# Patient Record
Sex: Female | Born: 1935 | Race: White | Hispanic: No | Marital: Single | Smoking: Never smoker
Health system: Southern US, Community
[De-identification: ages and names within clinical notes are randomized; demographics above are authoritative.]

## PROBLEM LIST (undated history)

## (undated) DIAGNOSIS — S62101A Fracture of unspecified carpal bone, right wrist, initial encounter for closed fracture: Secondary | ICD-10-CM

## (undated) DIAGNOSIS — R51 Headache: Secondary | ICD-10-CM

## (undated) DIAGNOSIS — E785 Hyperlipidemia, unspecified: Secondary | ICD-10-CM

## (undated) DIAGNOSIS — T4145XA Adverse effect of unspecified anesthetic, initial encounter: Secondary | ICD-10-CM

## (undated) DIAGNOSIS — I10 Essential (primary) hypertension: Secondary | ICD-10-CM

## (undated) DIAGNOSIS — N289 Disorder of kidney and ureter, unspecified: Secondary | ICD-10-CM

## (undated) DIAGNOSIS — IMO0001 Reserved for inherently not codable concepts without codable children: Secondary | ICD-10-CM

## (undated) DIAGNOSIS — Z5189 Encounter for other specified aftercare: Secondary | ICD-10-CM

## (undated) DIAGNOSIS — C55 Malignant neoplasm of uterus, part unspecified: Secondary | ICD-10-CM

## (undated) DIAGNOSIS — E559 Vitamin D deficiency, unspecified: Secondary | ICD-10-CM

## (undated) DIAGNOSIS — T8859XA Other complications of anesthesia, initial encounter: Secondary | ICD-10-CM

## (undated) DIAGNOSIS — I340 Nonrheumatic mitral (valve) insufficiency: Secondary | ICD-10-CM

## (undated) DIAGNOSIS — H5461 Unqualified visual loss, right eye, normal vision left eye: Secondary | ICD-10-CM

## (undated) DIAGNOSIS — R0602 Shortness of breath: Secondary | ICD-10-CM

## (undated) DIAGNOSIS — I499 Cardiac arrhythmia, unspecified: Secondary | ICD-10-CM

## (undated) DIAGNOSIS — E039 Hypothyroidism, unspecified: Secondary | ICD-10-CM

## (undated) DIAGNOSIS — R7301 Impaired fasting glucose: Secondary | ICD-10-CM

## (undated) DIAGNOSIS — H3530 Unspecified macular degeneration: Secondary | ICD-10-CM

## (undated) DIAGNOSIS — G43909 Migraine, unspecified, not intractable, without status migrainosus: Secondary | ICD-10-CM

## (undated) DIAGNOSIS — I4891 Unspecified atrial fibrillation: Secondary | ICD-10-CM

## (undated) DIAGNOSIS — N390 Urinary tract infection, site not specified: Secondary | ICD-10-CM

## (undated) DIAGNOSIS — C801 Malignant (primary) neoplasm, unspecified: Secondary | ICD-10-CM

## (undated) DIAGNOSIS — N184 Chronic kidney disease, stage 4 (severe): Secondary | ICD-10-CM

## (undated) HISTORY — DX: Malignant neoplasm of uterus, part unspecified: C55

## (undated) HISTORY — DX: Unqualified visual loss, right eye, normal vision left eye: H54.61

## (undated) HISTORY — DX: Migraine, unspecified, not intractable, without status migrainosus: G43.909

## (undated) HISTORY — DX: Urinary tract infection, site not specified: N39.0

## (undated) HISTORY — PX: NERVE BIOPSY: SHX1015

## (undated) HISTORY — DX: Vitamin D deficiency, unspecified: E55.9

## (undated) HISTORY — DX: Fracture of unspecified carpal bone, right wrist, initial encounter for closed fracture: S62.101A

## (undated) HISTORY — DX: Impaired fasting glucose: R73.01

## (undated) HISTORY — DX: Unspecified atrial fibrillation: I48.91

## (undated) HISTORY — DX: Unspecified macular degeneration: H35.30

## (undated) HISTORY — DX: Chronic kidney disease, stage 4 (severe): N18.4

## (undated) HISTORY — PX: BLEPHAROPLASTY: SUR158

## (undated) HISTORY — DX: Nonrheumatic mitral (valve) insufficiency: I34.0

## (undated) HISTORY — PX: OTHER SURGICAL HISTORY: SHX169

## (undated) HISTORY — DX: Hyperlipidemia, unspecified: E78.5

## (undated) HISTORY — PX: TONSILLECTOMY: SUR1361

---

## 1998-12-21 ENCOUNTER — Other Ambulatory Visit: Admission: RE | Admit: 1998-12-21 | Discharge: 1998-12-21 | Payer: Self-pay | Admitting: *Deleted

## 1999-01-25 ENCOUNTER — Encounter: Admission: RE | Admit: 1999-01-25 | Discharge: 1999-01-25 | Payer: Self-pay

## 1999-01-25 ENCOUNTER — Encounter: Payer: Self-pay | Admitting: *Deleted

## 2000-04-01 ENCOUNTER — Encounter: Admission: RE | Admit: 2000-04-01 | Discharge: 2000-04-01 | Payer: Self-pay | Admitting: Internal Medicine

## 2000-04-01 ENCOUNTER — Encounter: Payer: Self-pay | Admitting: Internal Medicine

## 2001-12-17 ENCOUNTER — Encounter: Payer: Self-pay | Admitting: Internal Medicine

## 2001-12-17 ENCOUNTER — Encounter: Admission: RE | Admit: 2001-12-17 | Discharge: 2001-12-17 | Payer: Self-pay | Admitting: Internal Medicine

## 2002-01-01 ENCOUNTER — Encounter: Admission: RE | Admit: 2002-01-01 | Discharge: 2002-01-01 | Payer: Self-pay | Admitting: Internal Medicine

## 2002-01-01 ENCOUNTER — Encounter: Payer: Self-pay | Admitting: Internal Medicine

## 2002-01-26 ENCOUNTER — Encounter (INDEPENDENT_AMBULATORY_CARE_PROVIDER_SITE_OTHER): Payer: Self-pay | Admitting: Specialist

## 2002-01-26 ENCOUNTER — Inpatient Hospital Stay (HOSPITAL_COMMUNITY): Admission: RE | Admit: 2002-01-26 | Discharge: 2002-02-02 | Payer: Self-pay

## 2002-02-10 ENCOUNTER — Ambulatory Visit: Admission: RE | Admit: 2002-02-10 | Discharge: 2002-02-10 | Payer: Self-pay | Admitting: Gynecology

## 2002-02-15 ENCOUNTER — Encounter: Payer: Self-pay | Admitting: Gynecology

## 2002-02-15 ENCOUNTER — Ambulatory Visit (HOSPITAL_COMMUNITY): Admission: RE | Admit: 2002-02-15 | Discharge: 2002-02-15 | Payer: Self-pay | Admitting: Gynecology

## 2002-03-02 ENCOUNTER — Ambulatory Visit (HOSPITAL_COMMUNITY): Admission: RE | Admit: 2002-03-02 | Discharge: 2002-03-02 | Payer: Self-pay | Admitting: Oncology

## 2002-03-02 ENCOUNTER — Encounter: Payer: Self-pay | Admitting: Oncology

## 2002-03-09 ENCOUNTER — Inpatient Hospital Stay (HOSPITAL_COMMUNITY): Admission: AD | Admit: 2002-03-09 | Discharge: 2002-03-11 | Payer: Self-pay | Admitting: Oncology

## 2002-03-30 ENCOUNTER — Inpatient Hospital Stay (HOSPITAL_COMMUNITY): Admission: AD | Admit: 2002-03-30 | Discharge: 2002-04-01 | Payer: Self-pay | Admitting: Gynecology

## 2002-04-13 ENCOUNTER — Ambulatory Visit: Admission: RE | Admit: 2002-04-13 | Discharge: 2002-04-13 | Payer: Self-pay | Admitting: Gynecologic Oncology

## 2002-04-27 ENCOUNTER — Inpatient Hospital Stay (HOSPITAL_COMMUNITY): Admission: AD | Admit: 2002-04-27 | Discharge: 2002-04-29 | Payer: Self-pay | Admitting: Oncology

## 2002-05-18 ENCOUNTER — Inpatient Hospital Stay (HOSPITAL_COMMUNITY): Admission: AD | Admit: 2002-05-18 | Discharge: 2002-05-20 | Payer: Self-pay | Admitting: Oncology

## 2002-06-08 ENCOUNTER — Inpatient Hospital Stay (HOSPITAL_COMMUNITY): Admission: EM | Admit: 2002-06-08 | Discharge: 2002-06-10 | Payer: Self-pay | Admitting: Oncology

## 2002-07-06 ENCOUNTER — Inpatient Hospital Stay (HOSPITAL_COMMUNITY): Admission: EM | Admit: 2002-07-06 | Discharge: 2002-07-08 | Payer: Self-pay | Admitting: Oncology

## 2002-07-22 ENCOUNTER — Other Ambulatory Visit: Admission: RE | Admit: 2002-07-22 | Discharge: 2002-07-22 | Payer: Self-pay

## 2002-07-26 ENCOUNTER — Ambulatory Visit: Admission: RE | Admit: 2002-07-26 | Discharge: 2002-09-08 | Payer: Self-pay | Admitting: *Deleted

## 2002-08-16 ENCOUNTER — Ambulatory Visit (HOSPITAL_COMMUNITY): Admission: RE | Admit: 2002-08-16 | Discharge: 2002-08-16 | Payer: Self-pay | Admitting: Oncology

## 2002-08-16 ENCOUNTER — Encounter: Payer: Self-pay | Admitting: Oncology

## 2002-09-13 ENCOUNTER — Ambulatory Visit (HOSPITAL_COMMUNITY): Admission: RE | Admit: 2002-09-13 | Discharge: 2002-09-13 | Payer: Self-pay | Admitting: *Deleted

## 2002-09-13 ENCOUNTER — Encounter: Payer: Self-pay | Admitting: *Deleted

## 2002-09-15 ENCOUNTER — Ambulatory Visit: Admission: RE | Admit: 2002-09-15 | Discharge: 2002-09-15 | Payer: Self-pay | Admitting: Gynecology

## 2002-12-13 ENCOUNTER — Ambulatory Visit (HOSPITAL_COMMUNITY): Admission: RE | Admit: 2002-12-13 | Discharge: 2002-12-13 | Payer: Self-pay | Admitting: Gynecology

## 2002-12-13 ENCOUNTER — Encounter: Payer: Self-pay | Admitting: Gynecology

## 2002-12-30 ENCOUNTER — Encounter: Admission: RE | Admit: 2002-12-30 | Discharge: 2002-12-30 | Payer: Self-pay | Admitting: Internal Medicine

## 2003-02-09 ENCOUNTER — Ambulatory Visit: Admission: RE | Admit: 2003-02-09 | Discharge: 2003-02-09 | Payer: Self-pay | Admitting: Gynecology

## 2003-05-30 ENCOUNTER — Ambulatory Visit (HOSPITAL_COMMUNITY): Admission: RE | Admit: 2003-05-30 | Discharge: 2003-05-30 | Payer: Self-pay | Admitting: Oncology

## 2003-05-31 ENCOUNTER — Ambulatory Visit (HOSPITAL_COMMUNITY): Admission: RE | Admit: 2003-05-31 | Discharge: 2003-05-31 | Payer: Self-pay | Admitting: Oncology

## 2003-06-14 ENCOUNTER — Ambulatory Visit (HOSPITAL_COMMUNITY): Admission: RE | Admit: 2003-06-14 | Discharge: 2003-06-14 | Payer: Self-pay | Admitting: Oncology

## 2003-09-07 ENCOUNTER — Other Ambulatory Visit: Admission: RE | Admit: 2003-09-07 | Discharge: 2003-09-07 | Payer: Self-pay | Admitting: Gynecology

## 2003-09-07 ENCOUNTER — Encounter (INDEPENDENT_AMBULATORY_CARE_PROVIDER_SITE_OTHER): Payer: Self-pay | Admitting: Specialist

## 2003-09-07 ENCOUNTER — Ambulatory Visit: Admission: RE | Admit: 2003-09-07 | Discharge: 2003-09-07 | Payer: Self-pay | Admitting: Gynecology

## 2003-12-12 ENCOUNTER — Ambulatory Visit (HOSPITAL_COMMUNITY): Admission: RE | Admit: 2003-12-12 | Discharge: 2003-12-12 | Payer: Self-pay | Admitting: Oncology

## 2004-02-02 ENCOUNTER — Encounter: Admission: RE | Admit: 2004-02-02 | Discharge: 2004-02-02 | Payer: Self-pay | Admitting: Oncology

## 2004-06-01 ENCOUNTER — Ambulatory Visit: Payer: Self-pay | Admitting: Oncology

## 2004-06-12 ENCOUNTER — Ambulatory Visit (HOSPITAL_COMMUNITY): Admission: RE | Admit: 2004-06-12 | Discharge: 2004-06-12 | Payer: Self-pay | Admitting: Oncology

## 2004-09-21 ENCOUNTER — Ambulatory Visit: Admission: RE | Admit: 2004-09-21 | Discharge: 2004-09-21 | Payer: Self-pay | Admitting: Gynecology

## 2004-09-21 ENCOUNTER — Other Ambulatory Visit: Admission: RE | Admit: 2004-09-21 | Discharge: 2004-09-21 | Payer: Self-pay | Admitting: Gynecology

## 2004-11-22 ENCOUNTER — Ambulatory Visit: Payer: Self-pay | Admitting: Oncology

## 2004-11-27 ENCOUNTER — Ambulatory Visit (HOSPITAL_COMMUNITY): Admission: RE | Admit: 2004-11-27 | Discharge: 2004-11-27 | Payer: Self-pay | Admitting: Oncology

## 2004-11-29 ENCOUNTER — Ambulatory Visit (HOSPITAL_COMMUNITY): Admission: RE | Admit: 2004-11-29 | Discharge: 2004-11-29 | Payer: Self-pay | Admitting: Oncology

## 2005-02-04 ENCOUNTER — Encounter: Admission: RE | Admit: 2005-02-04 | Discharge: 2005-02-04 | Payer: Self-pay | Admitting: Oncology

## 2005-03-01 ENCOUNTER — Ambulatory Visit: Payer: Self-pay | Admitting: Oncology

## 2005-03-05 ENCOUNTER — Ambulatory Visit (HOSPITAL_COMMUNITY): Admission: RE | Admit: 2005-03-05 | Discharge: 2005-03-05 | Payer: Self-pay | Admitting: Oncology

## 2005-03-06 ENCOUNTER — Encounter (INDEPENDENT_AMBULATORY_CARE_PROVIDER_SITE_OTHER): Payer: Self-pay | Admitting: *Deleted

## 2005-03-06 ENCOUNTER — Other Ambulatory Visit: Admission: RE | Admit: 2005-03-06 | Discharge: 2005-03-06 | Payer: Self-pay | Admitting: Gynecology

## 2005-03-06 ENCOUNTER — Ambulatory Visit: Admission: RE | Admit: 2005-03-06 | Discharge: 2005-03-06 | Payer: Self-pay | Admitting: Gynecology

## 2005-04-08 ENCOUNTER — Encounter: Admission: RE | Admit: 2005-04-08 | Discharge: 2005-07-07 | Payer: Self-pay | Admitting: Internal Medicine

## 2005-08-27 ENCOUNTER — Ambulatory Visit: Payer: Self-pay | Admitting: Oncology

## 2005-09-04 LAB — CBC WITH DIFFERENTIAL/PLATELET
BASO%: 0.3 % (ref 0.0–2.0)
Basophils Absolute: 0 10*3/uL (ref 0.0–0.1)
EOS%: 2.1 % (ref 0.0–7.0)
HCT: 38.5 % (ref 34.8–46.6)
HGB: 13.3 g/dL (ref 11.6–15.9)
LYMPH%: 14.2 % (ref 14.0–48.0)
MCH: 32.4 pg (ref 26.0–34.0)
MCHC: 34.4 g/dL (ref 32.0–36.0)
MCV: 94.1 fL (ref 81.0–101.0)
MONO%: 7.5 % (ref 0.0–13.0)
NEUT%: 75.9 % (ref 39.6–76.8)
Platelets: 216 10*3/uL (ref 145–400)
lymph#: 1.1 10*3/uL (ref 0.9–3.3)

## 2005-09-04 LAB — COMPREHENSIVE METABOLIC PANEL
ALT: 17 U/L (ref 0–40)
AST: 17 U/L (ref 0–37)
Alkaline Phosphatase: 105 U/L (ref 39–117)
BUN: 32 mg/dL — ABNORMAL HIGH (ref 6–23)
Calcium: 9.3 mg/dL (ref 8.4–10.5)
Creatinine, Ser: 2.28 mg/dL — ABNORMAL HIGH (ref 0.40–1.20)
Total Bilirubin: 0.5 mg/dL (ref 0.3–1.2)

## 2005-12-16 ENCOUNTER — Ambulatory Visit (HOSPITAL_COMMUNITY): Admission: RE | Admit: 2005-12-16 | Discharge: 2005-12-16 | Payer: Self-pay | Admitting: Oncology

## 2005-12-20 ENCOUNTER — Ambulatory Visit: Admission: RE | Admit: 2005-12-20 | Discharge: 2005-12-20 | Payer: Self-pay | Admitting: Gynecology

## 2006-02-06 ENCOUNTER — Encounter: Admission: RE | Admit: 2006-02-06 | Discharge: 2006-02-06 | Payer: Self-pay | Admitting: Oncology

## 2006-02-21 ENCOUNTER — Ambulatory Visit (HOSPITAL_COMMUNITY): Admission: RE | Admit: 2006-02-21 | Discharge: 2006-02-21 | Payer: Self-pay | Admitting: Oncology

## 2006-02-28 ENCOUNTER — Ambulatory Visit: Payer: Self-pay | Admitting: Oncology

## 2006-03-05 LAB — CBC WITH DIFFERENTIAL/PLATELET
BASO%: 0.4 % (ref 0.0–2.0)
Basophils Absolute: 0 10*3/uL (ref 0.0–0.1)
EOS%: 2.7 % (ref 0.0–7.0)
HCT: 36.5 % (ref 34.8–46.6)
HGB: 12.5 g/dL (ref 11.6–15.9)
LYMPH%: 12.2 % — ABNORMAL LOW (ref 14.0–48.0)
MCH: 32.7 pg (ref 26.0–34.0)
MCHC: 34.1 g/dL (ref 32.0–36.0)
NEUT%: 76.1 % (ref 39.6–76.8)
Platelets: 196 10*3/uL (ref 145–400)
lymph#: 1 10*3/uL (ref 0.9–3.3)

## 2006-03-05 LAB — COMPREHENSIVE METABOLIC PANEL
AST: 16 U/L (ref 0–37)
BUN: 23 mg/dL (ref 6–23)
CO2: 23 mEq/L (ref 19–32)
Calcium: 9.1 mg/dL (ref 8.4–10.5)
Chloride: 104 mEq/L (ref 96–112)
Creatinine, Ser: 2.07 mg/dL — ABNORMAL HIGH (ref 0.40–1.20)
Total Bilirubin: 0.5 mg/dL (ref 0.3–1.2)

## 2007-01-18 ENCOUNTER — Ambulatory Visit: Payer: Self-pay | Admitting: Oncology

## 2007-01-21 LAB — CBC WITH DIFFERENTIAL/PLATELET
Basophils Absolute: 0 10*3/uL (ref 0.0–0.1)
Eosinophils Absolute: 0.2 10*3/uL (ref 0.0–0.5)
HCT: 36.9 % (ref 34.8–46.6)
HGB: 13 g/dL (ref 11.6–15.9)
LYMPH%: 13.8 % — ABNORMAL LOW (ref 14.0–48.0)
MONO#: 0.5 10*3/uL (ref 0.1–0.9)
NEUT%: 77 % — ABNORMAL HIGH (ref 39.6–76.8)
Platelets: 183 10*3/uL (ref 145–400)
WBC: 8.1 10*3/uL (ref 3.9–10.0)
lymph#: 1.1 10*3/uL (ref 0.9–3.3)

## 2007-01-21 LAB — COMPREHENSIVE METABOLIC PANEL
ALT: 13 U/L (ref 0–35)
BUN: 29 mg/dL — ABNORMAL HIGH (ref 6–23)
CO2: 24 mEq/L (ref 19–32)
Calcium: 9.2 mg/dL (ref 8.4–10.5)
Chloride: 105 mEq/L (ref 96–112)
Creatinine, Ser: 2.08 mg/dL — ABNORMAL HIGH (ref 0.40–1.20)
Glucose, Bld: 93 mg/dL (ref 70–99)

## 2007-01-26 ENCOUNTER — Ambulatory Visit (HOSPITAL_COMMUNITY): Admission: RE | Admit: 2007-01-26 | Discharge: 2007-01-26 | Payer: Self-pay | Admitting: Oncology

## 2007-02-09 ENCOUNTER — Encounter: Admission: RE | Admit: 2007-02-09 | Discharge: 2007-02-09 | Payer: Self-pay | Admitting: Internal Medicine

## 2007-07-01 ENCOUNTER — Ambulatory Visit: Admission: RE | Admit: 2007-07-01 | Discharge: 2007-07-01 | Payer: Self-pay | Admitting: Gynecology

## 2008-01-15 ENCOUNTER — Ambulatory Visit: Payer: Self-pay | Admitting: Oncology

## 2008-01-18 LAB — CBC WITH DIFFERENTIAL/PLATELET
BASO%: 0.4 % (ref 0.0–2.0)
EOS%: 2.6 % (ref 0.0–7.0)
MCH: 31.7 pg (ref 26.0–34.0)
MCHC: 34.3 g/dL (ref 32.0–36.0)
MCV: 92.5 fL (ref 81.0–101.0)
MONO%: 7.2 % (ref 0.0–13.0)
NEUT%: 72.1 % (ref 39.6–76.8)
RDW: 14.1 % (ref 11.3–14.5)
lymph#: 1.1 10*3/uL (ref 0.9–3.3)

## 2008-01-18 LAB — COMPREHENSIVE METABOLIC PANEL
ALT: 14 U/L (ref 0–35)
AST: 17 U/L (ref 0–37)
Alkaline Phosphatase: 135 U/L — ABNORMAL HIGH (ref 39–117)
Calcium: 9.2 mg/dL (ref 8.4–10.5)
Chloride: 103 mEq/L (ref 96–112)
Creatinine, Ser: 1.78 mg/dL — ABNORMAL HIGH (ref 0.40–1.20)

## 2008-01-25 ENCOUNTER — Ambulatory Visit (HOSPITAL_COMMUNITY): Admission: RE | Admit: 2008-01-25 | Discharge: 2008-01-25 | Payer: Self-pay | Admitting: Oncology

## 2008-02-11 ENCOUNTER — Encounter: Admission: RE | Admit: 2008-02-11 | Discharge: 2008-02-11 | Payer: Self-pay | Admitting: Internal Medicine

## 2008-07-22 ENCOUNTER — Ambulatory Visit: Admission: RE | Admit: 2008-07-22 | Discharge: 2008-07-22 | Payer: Self-pay | Admitting: Gynecology

## 2009-02-13 ENCOUNTER — Encounter: Admission: RE | Admit: 2009-02-13 | Discharge: 2009-02-13 | Payer: Self-pay | Admitting: Internal Medicine

## 2009-07-14 ENCOUNTER — Ambulatory Visit: Admission: RE | Admit: 2009-07-14 | Discharge: 2009-07-14 | Payer: Self-pay | Admitting: Gynecology

## 2009-12-26 DIAGNOSIS — S62101A Fracture of unspecified carpal bone, right wrist, initial encounter for closed fracture: Secondary | ICD-10-CM

## 2009-12-26 HISTORY — DX: Fracture of unspecified carpal bone, right wrist, initial encounter for closed fracture: S62.101A

## 2010-01-20 ENCOUNTER — Emergency Department (HOSPITAL_BASED_OUTPATIENT_CLINIC_OR_DEPARTMENT_OTHER): Admission: EM | Admit: 2010-01-20 | Discharge: 2010-01-20 | Payer: Self-pay | Admitting: Emergency Medicine

## 2010-01-20 ENCOUNTER — Ambulatory Visit: Payer: Self-pay | Admitting: Diagnostic Radiology

## 2010-03-15 ENCOUNTER — Encounter
Admission: RE | Admit: 2010-03-15 | Discharge: 2010-03-15 | Payer: Self-pay | Source: Home / Self Care | Attending: Internal Medicine | Admitting: Internal Medicine

## 2010-07-10 NOTE — Consult Note (Signed)
[name], [name]              ACCOUNT NO.:  [id number]   MEDICAL RECORD NO.:  [id number]          PATIENT TYPE:  OUT   LOCATION:  GYN                          FACILITY:  [facility]   PHYSICIAN:  Marti Sleigh, M.D.DATE OF BIRTH:  28-Feb-1931   DATE OF CONSULTATION:  07/01/2007  DATE OF DISCHARGE:                                 CONSULTATION   CHIEF COMPLAINT:  Leiomyosarcoma of the uterus.   INTERVAL HISTORY:  Since her last visit, the patient has done well.  She  denies any GI or GU symptoms.  There is no pelvic pain pressure, vaginal  bleeding or discharge.  She has seen Dr. Jana Hakim in the interval.  Her  main concern is that her vision is deteriorating.   Functional status is excellent.   HISTORY OF PRESENT ILLNESS:  Stage I leiomyosarcoma undergoing initial  TAH-BSO and sigmoid resection with low rectal anastomosis in December  2004.  Tumor was completely resected, and she received 6 cycles of  adjuvant chemotherapies in combination Adriamycin and ifosfamide  completed in May of 2004.  Since that time, the patient has done well.  She has had follow up CT scans, all of which have been normal.   PAST MEDICAL HISTORY:  Medical illnesses:  Hypertension,  hyperthyroidism, and obesity.   CURRENT MEDICATIONS:  Synthroid, Lisinopril, atenolol, felodipine ER,  bumetanide, potassium, iron, vitamin D.   DRUG ALLERGIES:  PENICILLIN, MORPHINE, MYCINS, SULFA, ADHESIVE TAPE,  ASPIRIN, MSG, IODINE, EGGS, ONIONS, AND WHEAT.   FAMILY HISTORY:  Negative for gynecologic,  breast or colon cancer.   REVIEW OF SYSTEMS:  10-point comprehensive review of systems negative  except  as noted above.   SOCIAL HISTORY:  The patient is single.  She comes accompanied by one of  her adult daughters.  She actively sings in the church choir.  Does not  smoke.   PHYSICAL EXAMINATION:  Weight 188 pounds, blood pressure 152/84, pulse  80, respiratory rate 20.  GENERAL:  The patient is a healthy  white female in no acute distress.  HEENT:  Negative.  NECK:  Supple without thyromegaly.  There is no supraventricular or  inguinal adenopathy.  ABDOMEN:  Soft, nontender, no masses, organomegaly, ascites or hernias  are noted.  PELVIC EXAM:  EG/BUS, vagina, bladder and urethra are normal.  Cervix  and uterus are surgically absent.  Adnexa without masses.  Rectovaginal  exam confirms.  LOWER EXTREMITIES:  Without edema or varicosities.   IMPRESSION:  Leiomyosarcoma of the uterus now with five years of follow  up.   PLAN:  The patient will return to see me in one year for continuing  follow up.  We will alternate visits with Dr. Jana Hakim who plans to see  the patient in December.      Marti Sleigh, M.D.  Electronically Signed     DC/MEDQ  D:  07/01/2007  T:  07/01/2007  Job:  [id number]   cc:   Caswell Corwin, R.N.  [address], [place] [address]   Welling Magrinat, M.D.  Fax: [phone]   Fabio Asa, M.D.  Fax: [phone]  Delanna Ahmadi, M.D.  Fax: [phone]

## 2010-07-10 NOTE — Consult Note (Signed)
[name], KARIS              ACCOUNT NO.:  [id number]   MEDICAL RECORD NO.:  [id number]          PATIENT TYPE:  OUT   LOCATION:  GYN                          FACILITY:  [facility]   PHYSICIAN:  Marti Sleigh, M.D.DATE OF BIRTH:  30-Jul-1931   DATE OF CONSULTATION:  07/22/2008  DATE OF DISCHARGE:                                 CONSULTATION   CHIEF COMPLAINT:  Leiomyosarcoma of the uterus.   INTERVAL HISTORY:  The patient returns today for continuing follow up of  her leiomyosarcoma.  Since her last visit, she has done well.  She  denies any GI or GU symptoms.  Has no pelvic pain, pressure, vaginal  discharge.  Functional status is very good.  The patient did have an  upper respiratory tract infection over the winter and decreased her  exercise program, subsequently gaining some of the weight that she had  lost.  She is now back into her exercise program.   She has recently lost her driver's licence because of macular  degeneration.   HISTORY OF PRESENT ILLNESS:  December 2004, stage I leiomyosarcoma (high-  grade).  The patient underwent a TAH BSO and rectosigmoid resection with  low rectal anastomosis.  All gross tumor was resected, and she was  treated with six cycles of Adriamycin and isophosphamide completed in  May 2004.  Since that time, she has done well with no evidence of  recurrent disease.   PAST MEDICAL HISTORY/MEDICAL ILLNESSES:  1. Hypertension.  2. Hyperthyroidism.  3. Obesity.  4. Macular degeneration.   CURRENT MEDICATIONS:  Synthroid, lisinopril, atenolol, felodipine ER,  bumetanide, potassium, iron, vitamin D and Avastin (for macular  degeneration).   ALLERGIES:  PENICILLIN, MORPHINE, MYCIN, SULFA, ADHESIVE TAPE, ASPIRIN,  MSG, IODINE, EGGS, ONION, WHEAT.   FAMILY HISTORY:  Negative for gynecologic, breast or colon cancer.   REVIEW OF SYSTEMS:  A 10-point comprehensive review of systems is  negative except as noted above.   SOCIAL HISTORY:   The patient is single.  She comes accompanied by her  adult daughter, who recently had a GIST tumor of the duodenum.   PHYSICAL EXAMINATION:  VITAL SIGNS:  Weight 181 pounds, blood pressure  110/80.  GENERAL:  The patient is a healthy white female in no acute distress.  HEENT:  Negative.  NECK:  Supple without thyromegaly.  No supraclavicular or inguinal  adenopathy.  ABDOMEN:  Soft, nontender.  No mass, organomegaly, ascites or hernias  noted.  PELVIC:  EG/BUS, vaginal, bladder, urethra are normal, except the  patient does have a moderate cystocele.  Bimanual rectovaginal exam  revealed no masses, induration or nodularity.  EXTREMITIES:  Lower extremities 1+ ankle edema.   IMPRESSION:  Stage I high grade leiomyosarcoma December 2004.  No  evidence of recurrent disease.  The patient will return to see [place] on an  annual basis.  She will continue to have mammograms performed by Dr.  Laurann [place].      Marti Sleigh, M.D.  Electronically Signed     DC/MEDQ  D:  07/22/2008  T:  07/22/2008  Job:  [id number]   cc:  Virgie Dad. Magrinat, M.D.  Fax: [phone]   Caswell Corwin, R.N.  [address]  [place], [place] [address]   Fabio Asa, M.D.  Fax: [phone]   Delanna Ahmadi, M.D.  Fax: [phone]

## 2010-07-13 NOTE — Discharge Summary (Signed)
   NAME:  [name], [name]                        ACCOUNT NO.:  [id number]   MEDICAL RECORD NO.:  [id number]                   PATIENT TYPE:  INP   LOCATION:  [place]                                 FACILITY:  [facility]   PHYSICIAN:  Rayne Du. Maretta Bees, M.D.              DATE OF BIRTH:  09/01/1931   DATE OF ADMISSION:  01/26/2002  DATE OF DISCHARGE:  02/02/2002                                 DISCHARGE SUMMARY   ADMISSION DIAGNOSES:  Pelvic mass.   PRINCIPAL DIAGNOSIS:  Leiomyosarcoma of the uterus.   SECONDARY DIAGNOSIS:  Metastatic to sigmoid colon.   PROCEDURE:  Exploratory laparotomy with total abdominal hysterectomy and  bilateral salpingo-oophorectomy and resection of sigmoid colon with primary  reanastomosis.   COMPLICATIONS:  None.   CONDITION ON DISCHARGE:  Stable.   HOSPITAL COURSE:  The patient was admitted with pelvic mass of uncertain  etiology. She had a preoperative  hemoglobin of 11.1. She had exploratory  laparotomy which revealed evidence of a metastatic carcinoma of the uterus,  which ultimately was a leiomyosarcoma. This was resected without difficulty  and the patient had negative peritoneal washings. However, the mass had  involved the sigmoid colon as well as out to the uterine wall. It was a high  grade cell type. The patient in the postoperative course had a slow return  of bowel function and ultimately low grade temperature. She did have  spontaneous resolution of her fever ultimately with stable hemoglobin and  was discharged home with full instructions regarding activity limits, wound  care, follow-up in the office and medications. The patient had no  complications.   FINAL DIAGNOSES:  Leiomyosarcoma of the uterus.                                                Rayne Du. Maretta Bees, M.D.    NJT/MEDQ  D:  03/25/2002  T:  03/25/2002  Job:  [id number]

## 2010-07-13 NOTE — H&P (Signed)
NAME:  [name], [name]                        ACCOUNT NO.:  [id number]   MEDICAL RECORD NO.:  [id number]                   PATIENT TYPE:  INP   LOCATION:  0278                                 FACILITY:  [facility]   PHYSICIAN:  Virgie Dad. Magrinat, M.D.            DATE OF BIRTH:  Nov 07, 1931   DATE OF ADMISSION:  03/30/2002  DATE OF DISCHARGE:                                HISTORY & PHYSICAL   HISTORY OF PRESENT ILLNESS:  The patient is a 77 year old [name] woman  with a history of high grade leiomyosarcoma grade 3, status post TAH/BSO  with resection of part of the sigmoid (EEA) December 2003, being admitted  for her second cycle of Adriamycin ifosfamide.   PAST MEDICAL HISTORY:  This is significant for port placement, hypertension,  history of heart murmur, hypothyroidism, history of remote accident to left  eye, history of tonsillectomy and adenoidectomy, history of left anterior  scalene lymph node biopsy in the 1960s for what she says was a benign  condition, history of blepharoplasty, history of removal of benign skin  lesions, history of D&C, and history of bilateral cataract surgery.   ALLERGIES:  She is allergic to PENICILLIN and THE USUAL TYPE OF TAPE but she  can tolerate paper tape well.  She is allergic to any EGG-BASED MEDICINES.   MEDICATIONS:  1. Synthroid 75 mcg daily.  2. Bumex 2 mg daily as needed for swelling.  3. Inderal LA 120 mg daily.  4. Prinivil 20 mg daily.  5. Plendil 2.5 mg daily.  6. K-Dur 10 mEq b.i.d.  7. Zoloft just increased to 100 mg daily.  8. Protonix just started this admission at 40 mg daily.  9. She has p.r.n.'s including a multivitamin.  10.      She also has a prescription for Phenergan 25 mg to take p.r.n. for     nausea x2-3 days after her discharge.   REVIEW OF SYSTEMS:  She did well with the first cycle except for hair loss  which is now near total.  There have been no visual changes.  No unusual  headaches, no mental status  changes, no nausea or vomiting problems.  There  has been no cough, phlegm production, pleurisy or shortness of breath.  Beside her usual symptoms no usual fatigue and, in fact, she was feeling  quite well this last week although she has felt a little bit more anxious of  course this week.  There has been no change in bowel or bladder habits.   PHYSICAL EXAMINATION:  VITAL SIGNS:  Weight 180, temperature 97.2, pulse 65,  respiratory rate 16, blood pressure 148/80.  HEENT:  Sclerae are nonicteric.  Oropharynx is clear.  NECK:  Supple.  LYMPH NODES:  I do not detect any cervical, supraclavicular, or axillary  adenopathy.  LUNGS:  No crackles or wheezes.  CARDIOVASCULAR:  Regular rate and rhythm.  BREASTS:  Examination deferred.  ABDOMEN:  Soft, nontender with positive bowel sounds, no masses palpated.  MUSCULOSKELETAL:  No focal spinal tenderness.  No significant peripheral  edema.  NEUROLOGICAL:  Examination is nonfocal.   LABORATORY DATA:  Pending.   IMPRESSION/PLAN:  This is a 77 year old [name] woman with a history of  high grade leiomyosarcoma, status post exploratory laparotomy with total  abdominal hysterectomy/bilateral salpingo-oophorectomy and partial resection  of the sigmoid with end-to-end anastomosis for a T2B, NX, MO stage III  tumor, with focally positive margins but no measurable disease  postoperatively, admitted for her second cycle of chemotherapy.   She has a good understanding of the possible toxicity, side effects and  complications of treatment.  We are making no changes in her treatment  protocol.  She has already been scheduled to see Dr. Pearlie Oyster after discharge  in 12-14 days and her next admission for a similar cycle of chemotherapy  will be on April 20, 2002, unless there are complications or other  reasons for treatment delays.                                               Virgie Dad. Magrinat, M.D.    Lenard Galloway  D:  03/30/2002  T:  03/30/2002   Job:  [id number]   cc:   Delanna Ahmadi, M.D.  [address] [place] [address]  Fax: [place] Maretta Bees, M.D.  [address], [place]  [place] [address]  Fax: [phone]   Marti Sleigh, M.D.  [address]  [place]  [place] [address]  Fax: [place], M.D.  [address] [place]  [place] [address]  Fax: [phone]

## 2010-07-13 NOTE — Op Note (Signed)
NAME:  [name], [name]                        ACCOUNT NO.:  [id number]   MEDICAL RECORD NO.:  [id number]                   PATIENT TYPE:  INP   LOCATION:  [id number]                                 FACILITY:  [facility]   PHYSICIAN:  Marti Sleigh, M.D.         DATE OF BIRTH:  June 09, 1931   DATE OF PROCEDURE:  01/26/2002  DATE OF DISCHARGE:                                 OPERATIVE REPORT   PREOPERATIVE DIAGNOSIS:  Pelvic mass, probable uterine fibroids.   POSTOPERATIVE DIAGNOSIS:  Uterine sarcoma.   PROCEDURES:  Exploratory laparotomy, total abdominal hysterectomy, bilateral  salpingo-oophorectomy, resection of sigmoid colon with anastomosis.   SURGEON:  Marti Sleigh, M.D.   ASSISTANTS:  Rayne Du. Maretta Bees, M.D., and Joan Flores, M.D.   ANESTHESIA:  General with orotracheal tube.   ESTIMATED BLOOD LOSS:  950 cc.   OPERATIVE FINDINGS:  I was called to the operating room to consult with Dr.  Maretta Bees regarding findings which he encountered at the time of exploratory  laparotomy.  Essentially, the patient had an enlarged uterus, approximately  14 weeks' gestational size, with obvious extension of tumor onto the bladder  flap and involving the sigmoid colon.   Exploration of the upper abdomen, including the diaphragms, liver, spleen,  stomach, omentum, small and large bowel, except for the bowel in the pelvis,  was normal.  There was no adenopathy in the pelvis or aortic chains.  At the  completion of the surgical procedure, all gross disease had been resected.  Frozen section indicated this was a sarcoma, most likely a leiomyosarcoma.   DESCRIPTION OF PROCEDURE:  The abdomen was previously opened.  The midline  incision was extended somewhat and a Bookwalter retractor was assembled.  The small bowel was packed out of the pelvis.  The retroperitoneal spaces on  both sides of the pelvis were opened, identifying the external iliac artery,  internal iliac artery, and  ureter.  The ovarian vessels were skeletonized,  clamped, cut, and suture ligated.  A bladder flap was created, which  required very meticulous sharp and blunt dissection of the peritoneum  overlying the dome of the bladder and extending that plane of this  dissection down to the level of the cervix.  At this juncture the bladder  was advanced with sharp and blunt dissection away from the cervix and upper  vagina.  We attempted to dissect the sigmoid colon off of the top of the  uterus, but this led to a considerable amount of bleeding and there was no  clear surgical plane.  Given that it was clear based on a biopsy and frozen  section report that the patient had a malignancy, it was felt most  appropriate to resect this segment of colon that was involved.  A GIA  stapler was fired across the proximal sigmoid colon.  The distal portion of  the sigmoid colon, which was beyond the area of cancer involvement, had  previously been  opened.  It was recognized the patient had not had a bowel  prep, but nonetheless there was no significant spill of stool.  Having  transected the sigmoid colon above and below its involvement with the  uterine malignancy, there was good mobility and it was clear that the  posterior cul-de-sac was free of any involvement.  The uterine vessels were  skeletonized, clamped, cut, and suture ligated.  In a stepwise fashion, the  paracervical and cardinal ligaments were clamped, cut, and suture ligated.  In order to achieve better exposure in the deep pelvis, the cervix was  transected and the uterus, tubes, and ovaries sent for frozen section.   The remainder of the cervix was removed.  The vaginal angles were  crossclamped, divided, and the vagina transected from the cervix.  The  vaginal angles were transfixed with 0 Vicryl, the central portion of the  vagina closed with interrupted figure-of-eight sutures of 0 Vicryl.   Hemostasis in the pelvis was achieved with  cautery.   The sigmoid colon was then brought side by side with the descending colon  after the descending colon was mobilized from the pericolic gutter.  Once  side by side, two sutures were placed and the lumen opened.  A GIA stapler  was entered with one arm in each lumen, closed, and fired, thus making a  side-to-side anastomosis.  The enterotomy on the two arms of the colon was  closed with a TA-55 stapler.  The mesentery of the sigmoid colon was closed  with interrupted sutures of 2-0 silk.   Because of the extensive dissection along the bladder, the bladder was  filled with approximately 250 cc of sterile milk.  It was observed, and  there was no leak, nor were there any areas of obvious weakness in the  bladder wall.   The pelvis and abdomen were irrigated with copious saline.  Hemostasis again  ascertained.   The anterior abdominal wall was then closed in layers after retractors and  packs were removed.  The first layer was a running mass closure using #1  PDS, subcutaneous tissue was irrigated with saline, hemostasis achieved with  cautery, and the subcutaneous tissue reapproximated with 3-0 Vicryl.  Skin  staples were applied.  It should be noted that prior to abdominal closure, a  #19 Blake drain was placed in the pelvis and exited through a stab wound in  the right lower quadrant.  This was secured to the skin with 2-0 silk.   Sponge, needle, and instrument counts correct x2.                                                Marti Sleigh, M.D.    DC/MEDQ  D:  01/27/2002  T:  01/27/2002  Job:  [id number]   cc:   Rayne Du. Maretta Bees, M.D.  [address], [place] [address]  Fax: [phone]   Joan Flores, M.D.  [address], [address]  [place] [address]  Fax: [phone]   Caswell Corwin, R.N.  [address] [place], [place] [address]  Fax: 1

## 2010-07-13 NOTE — H&P (Signed)
NAME:  [name], [name]                        ACCOUNT NO.:  [id number]   MEDICAL RECORD NO.:  [id number]                   PATIENT TYPE:  INP   LOCATION:  0298                                 FACILITY:  [facility]   PHYSICIAN:  Virgie Dad. Magrinat, M.D.            DATE OF BIRTH:  August 22, 1931   DATE OF ADMISSION:  03/09/2002  DATE OF DISCHARGE:                                HISTORY & PHYSICAL   HISTORY OF PRESENT ILLNESS:  [name] is a 77 year old [name]  woman with a history of uterine leiomyosarcoma, status post a total  abdominal hysterectomy and bilateral salpingo-oophorectomy with partial  resection of the sigmoid colon (with end-to-end anastomosis), January 26, 2002, for what proved to be a 10 cm high-grade tumor with focally positive  margins.  There were more than 20 metastases per 10 high-powered fields.  Currently though, the patient has no measurable disease.  She is admitted  for her first cycle of chemotherapy as per [facility] protocol after a prior phase  2 [facility] protocol using doxorubicin and ifosfamide.   PAST MEDICAL HISTORY:  1. Hypertension.  2. History of heart murmur.  3. Hypothyroidism.  4. 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  5. History of Port-A-Cath placement.  6. History of tonsillectomy and adenoidectomy.  7. History of benign biopsy of a left anterior scalene lymph node in 1960.  8. Status post bilateral blepharoplasty.  9. Status post D&C x 1.  10.      Status post bilateral cataract surgery.   ALLERGIES:  She is allergic to PENICILLIN and allergic to any EGG-BASED  MEDICATION.  She is intolerant of REGULAR TAPE but tolerates paper tape  well.   MEDICATIONS:  Synthroid, Bumex, Inderal, Prinvil, Plendil, K-Dur, Ocuflox,  multivitamins, Zoloft, Tylenol, lorazepam, and Ambien.   FAMILY HISTORY:  The patient's father died at the age of 25 from colon  cancer.  The patient's mother died at the age of 64 with congestive heart  failure.  The patient had one sister who died from a myocardial infarction  at the age of 66.   GYNECOLOGIC HISTORY:  She is G2, S2, P0, A0, L2.   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  She has been divorced approximately 20 years and lives  by herself.  Her daughter, Janilah Czar, teaches math at the [employer] in [place], and daughter Vaughan Basta works in occupational  therapy in [place] in the [employer].  The patient  is Episcopalian.   REVIEW OF SYSTEMS:  The patient is anxious and did not sleep well last  night.  She also had some loose bowel movements this morning but otherwise,  she has done very well postoperatively with no significant pain, no  bleeding, no evidence of infection, no evidence of dehiscence, no unusual  swelling.  There have been no unusual  headaches and no significant visual  changes.  As noted before, of course, she is blind in the left eye.  There  has no cough, phlegm production, pleurisy, no significant sore throat,  minimal sinus symptoms, and no problems with her bladder habit.   PHYSICAL EXAMINATION:  GENERAL:  She is a middle-aged, white female, who  weighs 182 pounds.  VITAL SIGNS:  Temperature is 97.1, pulse 88, respirations 18, blood pressure  148/92.  Pupils equal, round, and reactive.  HEENT:  Sclerae are not icteric.  The oropharynx is clear.  NECK:  Supple.  There is no thyromegaly.  There is no peripheral adenopathy.  LUNGS:  No crackles or wheezes.  HEART:  Regular rate and rhythm.  ABDOMEN:  Soft, nontender, positive bowel sounds.  The abdominal incision  has healed very nicely with a minimal area of scabbing in the mid superior  aspect without true dehiscence.  Bowel sounds are positive.  No masses are  palpated.  MUSCULOSKELETAL:  No peripheral edema.  NEUROLOGIC:  Nonfocal.   LABORATORY DATA:  Pending.   IMPRESSION AND PLAN:  A 77 year old  [name] woman, who is status post  radical debulking of a high-grade leiomyosarcoma, December 2003, the tumor  measuring 10 cm, with more than 20 metastases per 10 high-powered fields,  with focally positive margins; now admitted for her first of six planned  cycles of chemotherapy.   The patient has a very good understanding of the possible toxicities, side  effects, and complications of chemotherapy.                                               Virgie Dad. Magrinat, M.D.    Lenard Galloway  D:  03/09/2002  T:  03/09/2002  Job:  [id number]   cc:   Marti Sleigh, M.D.  [place]  [place]  [place] [address]  Fax: 1   Delanna Ahmadi, M.D.  [address] [place] [address]  Fax: [place] Maretta Bees, M.D.  [address], [address]  [place] [address]  Fax: [phone]   Fabio Asa, M.D.  [address] [place]  [place] [address]  Fax: [phone]

## 2010-07-13 NOTE — Discharge Summary (Signed)
NAME:  [name], [name]                        ACCOUNT NO.:  [id number]   MEDICAL RECORD NO.:  [id number]                   PATIENT TYPE:  INP   LOCATION:  0279                                 FACILITY:  [facility]   PHYSICIAN:  Lowell C. Pearlie Oyster, M.D.               DATE OF BIRTH:  10-13-1931   DATE OF ADMISSION:  04/27/2002  DATE OF DISCHARGE:  04/29/2002                                 DISCHARGE SUMMARY   DISCHARGE DIAGNOSES:  1. 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  Status post three cycles of     adriamycin/iphosphamide chemotherapy with cycle three completed this     admission.  2. Hypertension.  3. Hypothyroidism.  4. History of heart murmur.  5. Status post Port-A-Cath placement.  6. 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  7. Status post tonsillectomy.  8. Status post biopsy of a left anterior scalene lymph node in the 1960s for     a benign condition.  9. Status post bilateral cataract surgery.   CONSULTS:  None.   PROCEDURE:  Chemotherapy.   HISTORY OF PRESENT ILLNESS:  The patient is a 77 year old female with a  history of a high grade leiomyosarcoma of the uterus grade 3 status post  total abdominal hysterectomy/BSO with resection of part of the sigmoid colon  in December of 2003.  Prior to this admission she had completed two cycles  of doxorubicin/iphosphamide chemotherapy.  Her course has been somewhat  complicated by nausea following her treatments.  Otherwise, she has  tolerated the treatments well.  The patient presented on April 27, 2002 for  elective admission to proceed with cycle number three of chemotherapy.   HOSPITAL COURSE:  The patient was admitted to [facility] oncology  unit on April 27, 2002 to proceed with cycle number three of  adriamycin/iphosphamide  chemotherapy.  Pretreatment laboratory work showed  hemoglobin 11.1, white count 11.8, absolute neutrophil count 9.9, platelet  count 217,000.  Sodium 138, potassium 3.6, BUN 9, creatinine 1.1, total  bilirubin 0.8, alkaline phosphatase 77, SGOT 22, SGPT 23, total protein 7.2,  albumin 3.8, calcium 9.0, LDH 68.  Baseline urinalysis was negative for  blood.  Chemotherapy dosages were based on a body surface area of 1.90.  Beginning on April 27, 2002 patient received doxorubicin (50 sq mg) 95 mg IV  followed by iphosphamide (5 sq g) 9500 mg mixed with 9500 mg of Mesna which  infused over 24 hours.  At the completion of the 24-hour infusion patient  received Mesna 5 g IV over 12 hours.  Overall, she tolerated her treatment  well.  She received Zofran 8 mg, Decadron 10 mg, and Ativan 0.5 mg each  morning.  She had no  complaints of nausea or vomiting during this  hospitalization.  A urinalysis was checked daily and remained negative for  blood.  Due to difficulties with persistent nausea following previous  cycles, we elected to discharge her home on Zofran ODT 8 mg q.8h. for four  doses and then q.8h. as needed and Decadron 4 mg daily for two days.  She  was instructed to notify the office should she have any problems.  Her other  health problems remained stable during this hospitalization.  We did  increase her Zoloft to 100 mg daily.   On April 29, 2002 patient was felt to be stable for discharge home.  We had  arranged for her to have a follow-up appointment with Virgie Dad. Magrinat,  M.D. on May 21, 2002 at 9:45 a.m.  The patient is aware that she should  contact the office should she develop any problems in the interim.  We will  also check laboratory work on her in the office that day.   DISPOSITION:  1. Continued stable for discharge home.  2. Activity:  No restrictions.  3. Diet:  No restrictions.  4. Wound care:  Routine care of Port-A-Cath.  5. Special instructions:  The patient  was instructed to call for     uncontrolled nausea/vomiting, fever greater than or equal to 101 degrees,     chills, shortness of breath, pain, or any other problems.  6. Follow-up:  Virgie Dad. Magrinat, M.D. May 21, 2002 at 9:45 a.m.  7. Discharge medications:  Zofran ODT 8 mg q.8h. for four doses and q.8h. as     needed for nausea, Decadron 4 mg daily for two days, Synthroid 75 mcg     daily, Inderal LA 120 mg at bedtime, Prinivil 20 mg daily, Plendil 2.5 mg     daily, K-Dur 10 mEq daily, Zoloft 100 mg daily, Ambien 5 mg at bedtime as     needed for sleep, Bumex daily on an as needed basis (okay to resume on     April 30, 2002).     Ned Card, N.P.                         Sharen Heck C. Pearlie Oyster, M.D.    LT/MEDQ  D:  04/29/2002  T:  04/29/2002  Job:  [id number]   cc:   Virgie Dad. Magrinat, M.D.  [address] [place] [address]  Fax: [phone]

## 2010-07-13 NOTE — Consult Note (Signed)
NAME:  [name], [name]                        ACCOUNT NO.:  [id number]   MEDICAL RECORD NO.:  [id number]                   PATIENT TYPE:  OUT   LOCATION:  GYN                                  FACILITY:  [facility]   PHYSICIAN:  John T. Clarene Essex, M.D.                 DATE OF BIRTH:  1931-09-08   DATE OF CONSULTATION:  04/13/2002  DATE OF DISCHARGE:                                   CONSULTATION   HISTORY OF PRESENT ILLNESS:  This patient returns for reevaluation of her  surgical incision, receiving chemotherapy for a high-grade leiomyosarcoma.   The patient underwent TAH and BSO with resection and reanastomosis of the  sigmoid in December of 2003.  All disease was resected and she is receiving  ifosfamide/adriamycin chemotherapy.  She completed her second cycle, being  admitted on March 30, 2002.  She has tolerated chemotherapy quite well.  She has normal bowel function and denies leg swelling or vaginal bleeding.   PAST MEDICAL HISTORY:  Significant for:  1. Port placement.  2. Hypertension.  3. Heart murmur.  4. Hypothyroidism.   PAST SURGICAL HISTORY:  1. Tonsillectomy.  2. Left anterior scalene lymph node biopsy.  3. Blepharoplasty.  4. D&C.  5. Bilateral cataract surgery.   ALLERGIES:  PENICILLIN.   MEDICATIONS:  1. Synthroid.  2. Bumex.  3. Inderal.  4. Prinivil.  5. Plendil.  6. K-Dur.  7. Zoloft.  8. Protonix.  9. Phenergan p.r.n.   PERSONAL AND SOCIAL HISTORY:  The patient denies tobacco or ethanol use.   FAMILY HISTORY:  Significant for colon cancer in the patient's father, but  no ovarian or breast cancer.   REVIEW OF SYSTEMS:  The patient has tolerated chemotherapy quite well with  the exception of hair loss.  She denies GI or GU symptoms.   PHYSICAL EXAMINATION:  WEIGHT:  173-1/2 pounds.  VITAL SIGNS:  Blood pressure 140/84.  GENERAL APPEARANCE:  The patient is alert and oriented x 3 and in no acute  distress.  HEENT:  Benign with clear oropharynx.  ABDOMEN:  Soft and benign with well-healed midline incision.  There is no  ascites, mass, or organomegaly.  BACK:  There is no back or CVA tenderness.  EXTREMITIES:  Full range of motion without edema.  PELVIC:  External genitalia and BUS are normal to inspection and palpation.  The vagina is clear.  Bimanual and rectovaginal examinations reveal absent  uterus and cervix without mass or nodularity.   ASSESSMENT:  Uterine leiomyosarcoma, post resection, receiving chemotherapy.   PLAN:  We will schedule CT scan of abdomen and pelvis approximately two to  three weeks following her sixth cycle of chemotherapy and she will be  reevaluated in our unit.  We will plan whole pelvic radiotherapy  approximately six to eight weeks after completing her sixth cycle.  John T. Clarene Essex, M.D.    JTS/MEDQ  D:  04/13/2002  T:  04/13/2002  Job:  [id number]   cc:   Virgie Dad. Magrinat, M.D.  [address]. [place] [address]  Fax: [place]. Maretta Bees, M.D.  [place], [address]  [place]  [place] [address]  Fax: [phone]   Delanna Ahmadi, M.D.  [address] [place] [address]  Fax: [phone]   Caswell Corwin, R.N.   Glenis Smoker, M.D.   Fabio Asa, M.D.  [address] [place]  [place] [address]  Fax: [phone]

## 2010-07-13 NOTE — Op Note (Signed)
NAME:  [name], [name]                        ACCOUNT NO.:  [id number]   MEDICAL RECORD NO.:  [id number]                   PATIENT TYPE:  INP   LOCATION:  0008                                 FACILITY:  [facility]   PHYSICIAN:  Rayne Du. Maretta Bees, M.D.              DATE OF BIRTH:  02-28-1931   DATE OF PROCEDURE:  01/26/2002  DATE OF DISCHARGE:                                 OPERATIVE REPORT   PREOPERATIVE DIAGNOSES:  Uterine fibroids, pelvic mass.   POSTOPERATIVE DIAGNOSES:  Uterine fibroids, pelvic mass, leiomyosarcoma of  the uterus, adhesions to the sigmoid colon and bladder and involvement of  the sigmoid colon.   COMPLICATIONS:  None.   PROCEDURE:  Exploratory laparotomy with consultation intraoperative with Dr.  Aldean Ast, total abdominal hysterectomy, bilateral salpingo-  oophorectomy, partial sigmoid resection and primary side to side  reanastomosis. Extensive lysis of adhesions, total abdominal hysterectomy,  bilateral salpingo-oophorectomy.   ESTIMATED BLOOD LOSS:  950 cc   ANESTHESIA:  General.   SURGEON:  Rayne Du. Maretta Bees, M.D. for exploratory laparotomy.   ASSISTANT:  Joan Flores, M.D.   INTRAOPERATIVE CONSULTATION:  Dr. Ardath Sax for removal of  tumor.   DESCRIPTION OF PROCEDURE:  The patient was identified as [name]. She  received preoperative antibiotics, informed consent was obtained. The  possibility of cancer had been entertained and Dr. Fermin Schwab was on  standby services in the event that this was found on exploratory laparotomy.  The patient had a bladder catheterized with an indwelling catheter. The  Betadine prep was utilized. The midline incision was utilized for abdominal  access and upon entering the abdomen it was of note that the uterus was  adherent to the anterior bladder and the bowel was adherent to the fundus of  the uterus. Washings had been obtained and consultation with Dr. Fermin Schwab. Dr.  Fermin Schwab came in because of the likelihood of cancer and  we did a biopsy which confirmed a sarcoma on frozen section. The ovary on  the right side was identified and appeared to be normal as did the tube. The  uterus was globally enlarged and also had some fibroids present posteriorly.  The upper abdomen was free of disease including the liver, diaphragm,  omentum and all bowel structures. There was no evidence of any seeding or  other structures that had any involvement of disease. There was no  retroperitoneal adenopathy by palpation. The remaining part of the bowel  surfaces were uninvolved; however, the sigmoid colon was frozen to the  fundus of the uterus and intertwined. The decision was made to resect this  portion of the involved sigmoid colon. This was performed during the  hysterectomy. The hysterectomy specimen was taken and blocked with the  resected portion of colon and it was a supracervical hysterectomy with the  cervix removed separately. The hysterectomy was performed as follows:  The  round ligaments were identified, they  were clamped, divided and ligated with  2-0 Vicryl. The bladder flap was developed over the distal peritoneum  overlying the uterus. This was done with great care because the tissue plane  was involved with cancer from the uterus. The ureters were identified on  both sides, retracted laterally and separate and distinct from the  infundibulopelvic ligament. The infundibulopelvic ligaments were clamped,  divided and doubly ligated with 2-0 Vicryl first with a tie followed by a  suture. The sigmoid colon was stapled across at a point near to its  attachment to the uterus on both sides and the uterus was then freed from  the bowel and the bladder anteriorly with the dissection I had carried down.  Successive clamps down the cardinal, the uterosacral and the uterine vessels  were taken with Vicryl suture. The supracervical hysterectomy was carried  out  with removal of the mass and block. The mass had both ovaries attached  to it. It also had the portion of the sigmoid colon that was involved  attached to it. The cervix was then removed with better visualization and  this was done without difficulty. The ureters were identified, there was  hemostasis noted and all the retroperitoneal structures on the vascular  pedicles also were hemostatic. The bladder was filled with sterile milk and  it was found to be intact without any evidence of extravasation or leakage.  The ureter was clear. The patient also received additional adjuvant  antibiotic therapy because of the involvement of the bowel with clindamycin  or gentamycin. The bowel was clean in the lumen with the exception of one  small piece of feces which was removed. The instruments were removed with  it. The remaining portion of the bowel and lumen was again free of any  leakage. There was no spillage into the abdominal cavity. The bowel was  reanastomosed with the EndoGIA and this was performed with a side to side  anastomosis. The open portion of the anastomotic site was closed with a GIA  stapler as well. The site was checked and found to be intact without any  bleeding intraluminal and extraluminal as well. The two segments of colon  were also put together with a serosal suture of silk in two locations to  help support the staple line. There was no tension on the colon, it was  freed up and relatively mobile for the anastomosis. There was no active  bleeding. The irrigation had been performed copiously and all instruments,  sponges, and needle counts were correct. Everything was removed from the  abdomen. Again inspection for hemostasis was complete. There was a mass  closure from the fascia using #1 PDS from both the top and bottom and tied  in the midline. The subcutaneous tissues were closed with Vicryl. Prior to closing the fascial incision, a separate puncture point was placed  for a  drain which was placed deep in the cul-de-sac. The drain was done through a  separate stab wound. The skin was closed with staple closure. A nonadhesive  tape type of dressing was placed onto the abdominal cavity and the patient  left the operating room in stable condition. She received intraoperative  transfusion because of the nature of the tumor and a blood loss of 950 cc  and she was transferred from the operating room into the recovery room in  stable condition. There were no complications. All sponge, needle and  instrument counts were correct.  Rayne Du. Maretta Bees, M.D.    NJT/MEDQ  D:  01/26/2002  T:  01/26/2002  Job:  [id number]

## 2010-07-13 NOTE — Discharge Summary (Signed)
NAME:  [name], [name]                        ACCOUNT NO.:  [id number]   MEDICAL RECORD NO.:  [id number]                   PATIENT TYPE:  INP   LOCATION:  0271                                 FACILITY:  [facility]   PHYSICIAN:  Virgie Dad. Magrinat, M.D.            DATE OF BIRTH:  1931-06-09   DATE OF ADMISSION:  07/06/2002  DATE OF DISCHARGE:  07/08/2002                                 DISCHARGE SUMMARY   DISCHARGE DIAGNOSES:  1. Sarcoma.  2. Hypertension.  3. Status post port placement.  4. History of heart murmur.  5. Hypothyroidism.  6. Remote accident to the left eye with left eye blindness.  7. Status post tonsillectomy and adenoidectomy.  8. Status post blepharoplasty.  9. Status post dilatation and curettage.  10.      Status post bilateral cataract surgery.  11.      Hypokalemia.  12.      Anemia requiring transfusion.   PROCEDURES:  Intravenous chemotherapy and transfusion of blood products.   HOSPITAL COURSE:  The patient was admitted Jul 06, 2002 for her final cycle  of chemotherapy given adjuvantly for her leiomyosarcoma.  She had a  hemoglobin of 9.7 in our office and she received 2 units of packed red cells  prior to the start of chemotherapy, which brought her hemoglobin up to 10.9.  Her other lab work was adequate except for a potassium of 3.2 and her  potassium supplementation was increased as reflected in the discharge  medications.   On May 12 a.m. she was started on her final cycle of chemotherapy which  consisted of doxorubicin 50 mg/sqm at 92 mg total, followed by phosphoamide  5 grams/sqm - total dose 9250 mg with an equal amount of mesna given over 24  hours by continuous infusion, followed by a mesna bolus and then mesna 5  grams over 12 hours completed in the evening of Jul 08, 2002.   The patient tolerated treatment quite well, with no nausea or vomiting, no  hematuria, no mental status changes, and no other acute side effects.   During this  admission the patient was evaluated by Dr. Shelby Dubin and the  need for adjuvant pelvic radiation, the length of treatment, and the side  effects were discussed with the patient.  Tentatively, she has an  appointment to see him on July 28, 2002 to complete the consultation and  review the restaging studies.   CONDITION ON DISCHARGE:  Stable.   DISCHARGE MEDICATIONS:  1. Her potassium will be increased to 20 mEq twice daily.  2. All other medications are as before admission.  3. In addition, her Zofran may be taken up to three times a day and her     promethazine up to four times a day.  4. She may take her Bumex up to two days in a row and then skip a day.  5. Ambien may be taken several  nights in a row post treatment.   PAIN MANAGEMENT:  Tylenol as needed.   ACTIVITY:  Unrestricted.   DIET:  Unrestricted.   WOUND CARE:  Not applicable but she needs to flush her port every six weeks  if she is going to keep it.   SPECIAL INSTRUCTIONS:  She will call for pain, nausea, fever, or any other  problems.   FOLLOW-UP:  1. She will come to my office on Jul 10, 2003 for Neulasta and Aranesp.  2. She will see me on Jul 21, 2002.  We will review her scan results at that     time.  3. She will see Dr. Shelby Dubin on July 28, 2002 as already scheduled.                                               Virgie Dad. Magrinat, M.D.    Lenard Galloway  D:  07/08/2002  T:  07/08/2002  Job:  [id number]   cc:   Ulyess Blossom. Elba Barman, M.D.  [address]  [address]  Fax: [phone]   Marti Sleigh, M.D.   Rayne Du. Maretta Bees, M.D.  [place], [address]  [place] [address]  Fax: [phone]   Fabio Asa, M.D.  [place] [place] [address]  Fax: [phone]   Delanna Ahmadi, M.D.  [address] [place] [address]  Fax: [phone]

## 2010-07-13 NOTE — Consult Note (Signed)
NAME:  [name], [name]                        ACCOUNT NO.:  [id number]   MEDICAL RECORD NO.:  [id number]                   PATIENT TYPE:  OUT   LOCATION:  GYN                                  [facility]ACILITY:  [facility]   PHYSICIAN:  Marti Sleigh, M.D.         DATE OF BIRTH:  December 20, 1931   DATE OF CONSULTATION:  09/07/2003  DATE OF DISCHARGE:                                   CONSULTATION   A 77 year old white female seen in follow-up of a leiomyosarcoma of the  uterus.   INTERVAL HISTORY:  Since her last visit with me, the patient has seen Dr.  Jana Hakim.  She had a PET CT scan done in April which is essentially normal.  There is some persistent uptake in the abdominal incision but otherwise is  perfectly normal.   The patient has really no constitutional symptoms.  She is concerned about  her weight gain.  She denies any other GI or GU symptoms.   HISTORY OF PRESENT ILLNESS:  The patient underwent exploratory laparotomy,  total abdominal hysterectomy and bilateral salpingo-oophorectomy and sigmoid  resection with low rectal anastomosis December 2003, for a leiomyosarcoma.  She subsequently received six cycles of adjuvant chemotherapy using  combination of Adriamycin and iphosphamide which was completed in May of  2004.   PAST MEDICAL HISTORY:  1. Hypertension.  2. Hypothyroidism.  3. Obesity.   CURRENT MEDICATIONS:  1. Synthroid.  2. Inderal.  3. Lisinopril.  4. __________.  5. K-Dur.  6. Zoloft.  7. Ambien.   ALLERGIES:  1. PENICILLIN.  2. MORPHINE.  3. MYCINS.  4. SULFA.  5. TAPE.  6. ASPIRIN.   FAMILY HISTORY:  Negative for gynecologic, breast and colon cancers.   REVIEW OF SYMPTOMS:  Negative.   PHYSICAL EXAMINATION:  GENERAL APPEARANCE:  The patient is a healthy, obese  white female in no acute distress.  VITAL SIGNS:  Weight 192 pounds (up 11 pounds since December 2004).  HEENT:  Negative.  NECK:  Supple without thyromegaly.  There is no supraclavicular  or inguinal  adenopathy.  ABDOMEN:  Soft and nontender, no masses, organomegaly, ascites or hernias  noted.  Her midline is well-healed, although it feels thickened beneath the  incision.  PELVIC:  EG, BUS, vaginal, bladder and urethra are normal.  Cervix, uterus  surgically absent.  Bimanual examination reveals no masses or nodularity.  Rectovaginal examination confirms.   IMPRESSION:  Locally advanced leiomyosarcoma of the uterus, status post  complete surgical resection.  Status post six cycles of  Adriamycin  iphosphamide chemotherapy.   The patient is clinically free of disease.   PLAN:  Pap smears are obtained.  She will return to see Dr. Jana Hakim in  three months and will have scans just prior to that visit.  She will return  to see me in six months.  Marti Sleigh, M.D.    DC/MEDQ  D:  09/07/2003  T:  09/07/2003  Job:  [id number]   cc:   Virgie Dad. Magrinat, M.D.  [address]. [place] [address]  Fax: [phone]   Caswell Corwin, R.N.  [phone] [address]  [place], [place] [address]   Delanna Ahmadi, M.D.  [address] [place] [address]  Fax: [place] Elba Barman, M.D.  Fax: [phone]   Fabio Asa, M.D.  [address] [place]  [place] [address]  Fax: [phone]

## 2010-07-13 NOTE — H&P (Signed)
NAME:  [name], [name]                        ACCOUNT NO.:  [id number]   MEDICAL RECORD NO.:  [id number]                   PATIENT TYPE:  INP   LOCATION:  0279                                 FACILITY:  [facility]   PHYSICIAN:  Lowell C. Pearlie Oyster, M.D.               DATE OF BIRTH:  Oct 07, 1931   DATE OF ADMISSION:  04/27/2002  DATE OF DISCHARGE:                                HISTORY & PHYSICAL   SUMMARY:  The patient is a 77 year old female patient of Sarajane Jews C. Magrinat,  M.D. 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  She was started  on adriamycin/iphosphamide chemotherapy.  She has had two cycles to date and  now comes in for cycle three of this therapy.   PAST MEDICAL HISTORY:  1. Significant for prior port placement.  2. Hypertension.  3. History of hypothyroidism.  4. History of heart murmur.  5. 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  6. History of tonsillectomy.  7. History of left anterior scalene lymph node biopsy in 1960s for a benign     condition.  8. She has had a D&C in the past.  9. History of bilateral cataract surgery.   ALLERGIES:  She has an allergy to PENICILLIN.   MEDICATIONS:  1. Synthroid.  2. Inderal LA.  3. Prinivil.  4. Plendil.  5. K-Dur.  6. Zoloft.   REVIEW OF SYSTEMS:  Since last cycle she has a lot of fatigue and in fact  put off her therapy a week due to that fatigue.  However, at this point in  time she is relatively back to baseline.  She is eating, drinking.  She has  no infectious symptoms.   PHYSICAL EXAMINATION:  VITAL SIGNS:  Weight 180 pounds, blood pressure  176/87, temperature 97.8, pulse 69, respirations 18.  HEENT:  Oral mucosa is within normal limits.  NECK:  No cervical, supraclavicular, or axillary lymphadenopathy.  HEART:  S1  grade and S2 without rubs or gallops.  LUNGS:  Clear.  ABDOMEN:  Benign without hepatosplenomegaly.  Bowel sounds active.  No  inguinal adenopathy.   LABORATORIES:  Laboratory studies are pending.   ASSESSMENT:  Leiomyosarcoma of the uterus in for chemotherapy.   PLAN:  Assuming her laboratories will be baseline, will proceed with cycle  three of iphosphamide/adriamycin.  Her urinalysis will be checked  appropriately and she will get __________ as well through her iphosphamide.  After this cycle of therapy, she will be seen in approximately two and a  half weeks' time with Sarajane Jews C. Magrinat, M.D. to determine if further  staging is at play versus further chemotherapy versus a switch to radiation  therapy.  Lowell C. Pearlie Oyster, M.D.    LCS/MEDQ  D:  04/27/2002  T:  04/27/2002  Job:  [id number]   cc:   Virgie Dad. Magrinat, M.D.  [place]. [place] [address]  Fax: [phone]   Madie Reno. Clarene Essex, M.D.   Rayne [place]. Maretta Bees, M.D.  [place], [address]  [place]  [place] [address]  Fax: [phone]   Delanna Ahmadi, M.D.  [address] [place]  [place] [address]  Fax: [phone]   Fabio Asa, M.D.  [address]  [place] [address]  Fax: [phone]

## 2010-07-13 NOTE — Consult Note (Signed)
NAME:  [name], [name]                        ACCOUNT NO.:  [id number]   MEDICAL RECORD NO.:  [id number]                   PATIENT TYPE:  OUT   LOCATION:  GYN                                  FACILITY:  [facility]   PHYSICIAN:  Marti Sleigh, M.D.         DATE OF BIRTH:  [birth year]   DATE OF CONSULTATION:  09/15/2002  DATE OF DISCHARGE:                                   CONSULTATION   REASON FOR CONSULTATION:  A 77 year old white female returns for continuing  followup of leiomyosarcoma of the uterus.  She initially underwent  exploratory laparotomy for a large pelvic mass on January 26, 2002.  She was  found to have extension from the uterus to the sigmoid colon necessitating a  total abdominal hysterectomy, bilateral salpingo-oophorectomy, and sigmoid  resection with a low rectal anastomosis.  She had an uncomplicated  postoperatively course.  She had all gross disease resected and subsequently  she was treated with adjuvant chemotherapy using a combination of Adriamycin  and iphosphamide delivered under the direction of Gustav C. Magrinat, M.D.  She tolerated that therapy reasonably well.  Chemotherapy was completed in  May of 2004.  She has returned to work full-time, is very active, and feels  quite well.  She specifically denies any GI or GU symptoms, has no pelvic  pain, pressure, vaginal bleeding, or discharge.  She recently had a follow  up CT scan of the chest, abdomen, and pelvis.  Previously some pulmonary  nodules were identified.  Recent CT scan shows that the nodules are smaller  or at least stable and some are no longer apparent.  The patient has no  pulmonary symptoms.  The remainder of the CAT scan looks normal.   She comes accompanied by her daughter and son-in-law today who have a number  of questions regarding management of leiomyosarcomas.   CURRENT MEDICATIONS:  Synthroid, Inderal, lisinopril, Plendil, K-Dur,  Zoloft, and Ambien.   DRUG ALLERGIES:   PENICILLIN, MORPHINE, MYCINS, SULFA, TAPE and ASPIRIN.   PAST MEDICAL ILLNESSES:  1. Hypertension,  2. Hypothyroidism.   FAMILY HISTORY:  Positive for colon and breast cancer.   REVIEW OF SYSTEMS:  Essentially negative.   PHYSICAL EXAMINATION:  VITAL SIGNS:  Weight 168 pounds, blood pressure  150/86.  GENERAL:  The patient is a healthy white female in no acute distress.  HEENT:  Reveals resolving alopecia.  NECK:  Supple without thyromegaly.  LYMPHATICS:  There is no supraclavicular or inguinal adenopathy.  ABDOMEN:  Soft, nontender.  No masses, organomegaly, ascites, or hernias are  noted.  There is noted to be some thickening along her midline scar.  The  patient reports that this was even more apparent immediately after her  surgery and has been resolving over time.  PELVIC:  EGBUS, vagina, bladder, urethra are normal.  The vaginal cuff is  well healed.  Bimanual and rectovaginal exam reveals no masses, induration,  or nodularity.  IMPRESSION:  Locally advanced leiomyosarcoma status post complete surgical  resection and six cycles of Adriamycin and iphosphamide chemotherapy.   She remains free of disease.   The patient is scheduled to see Ulyess Blossom. Elba Barman, M.D. later today to discuss  the possibility of adjuvant whole pelvis radiation therapy.  I indicated to  the patient and her family that I did not feel there was any evidence that  getting radiation therapy of the pelvis in this setting would improve her  survival although it might reduce the risks of local recurrence.  The pros  and cons of radiation versus observation at this junction were discussed at  length.  We did not come to any final decision, but the patient will see Dr.  Elba Barman with this information in mind.   We will plan on following up another CT scan of her chest, abdomen, and  pelvis in three months.  If that is normal or shows no evidence of disease  then I think we could stretch the CT scan frequency to every  six months.  She is scheduled to have a CT scan on December 13, 2002.  She will see Dr.  Jana Hakim in September of 2004 and return to see [place] in December of 2004.                                               Marti Sleigh, M.D.    DC/MEDQ  D:  09/15/2002  T:  09/15/2002  Job:  [id number]   cc:   Virgie Dad. Magrinat, M.D.  [place]. [place] [address]  Fax: [place]. Maretta Bees, M.D.  [place], [address]  [place]  [place] [address]  Fax: [phone]   Delanna Ahmadi, M.D.  [address] [place] [address]  Fax: [phone]   Caswell Corwin, R.N.  [phone] [address] [place], [place] [address]

## 2010-07-13 NOTE — Consult Note (Signed)
NAMEMIKAH, BAUM              ACCOUNT NO.:  [id number]   MEDICAL RECORD NO.:  [id number]          PATIENT TYPE:  OUT   LOCATION:  GYN                          FACILITY:  [facility]   PHYSICIAN:  Marti Sleigh, M.D.DATE OF BIRTH:  04-05-1931   DATE OF CONSULTATION:  03/06/2005  DATE OF DISCHARGE:                                   CONSULTATION   A 77 year old white female returns for continuing follow-up of  leiomyosarcoma.   INTERVAL HISTORY:  Since her last visit, the patient has done well.  She  denies any GI or GU symptoms, has no pelvic pain, pressure, vaginal  bleeding, or discharge.   As part of her follow up, she had a repeat CT scan of the chest, abdomen,  and pelvis on January 9, which is entirely normal from the oncology point of  view.   HISTORY OF PRESENT ILLNESS:  The patient underwent TAH/BSO and sigmoid  resection with low rectal anastomosis January 27, 2003, for leiomyosarcoma  of the uterus.  She was subsequently treated with 6 cycles of adjuvant  chemotherapy using a combination of Adriamycin and ifosfamide completed in  May 2004.  Since that time, she been followed with serial CAT scans at 6  month intervals, all of which have been normal.  The patient has remained  entirely asymptomatic.   PAST MEDICAL HISTORY:   MEDICAL ILLNESSES:  1.  Hypertension.  2.  Hypothyroidism.  3.  Obesity.   CURRENT MEDICATIONS:  Synthroid, Inderal, Lisinopril, Norvasc, K-Dur, Bumex.   DRUG ALLERGIES:  1.  PENICILLIN.  2.  MORPHINE.  3.  MYCIN.  4.  SULFA.  5.  TAPE.  6.  ASPIRIN.   FAMILY HISTORY:  Negative for gynecologic, breast, or colon cancer.   REVIEW OF SYSTEMS:  A 10 point comprehensive review of systems is negative  except as noted above.   PHYSICAL EXAMINATION:  VITAL SIGNS:  Weight 204 pounds, blood pressure  130/80, pulse 80, respiratory rate 20.  GENERAL:  The patient is a healthy white female in no acute distress.  HEENT:  Negative.  NECK:   Supple without thyromegaly.  There is no supraclavicular or inguinal  adenopathy.  ABDOMEN:  Obese, soft, nontender.  No mass, organomegaly, ascites, or  hernias are noted.  PELVIC:  EG/BUS, vagina, bladder, urethra are normal.  Cervix and uterus  cervically absent.  Adnexa without masses.  Rectovaginal exam confirms.   LABORATORY WORK:  The patient's CT scan is reviewed with the patient and her  daughter, and this looks very good.   PLAN:  Pap smears are obtained.  The patient will return to see Dr. Jana Hakim  in 6 months, return to see [place] in 1 year.  We had agreed previously to  discontinue using CT scans for follow up now that the patient is past 3  years since her initial therapy.      Marti Sleigh, M.D.  Electronically Signed     DC/MEDQ  D:  03/06/2005  T:  03/07/2005  Job:  [id number]   cc:   Caswell Corwin, R.N.  [address] [facility]  [place], [place] [address]   [place] Magrinat, M.D.  Fax: [phone]   Delanna Ahmadi, M.D.  Fax: [place] Elba Barman, M.D.  Fax: [phone]   Fabio Asa, M.D.  Fax: [phone]

## 2010-07-13 NOTE — Discharge Summary (Signed)
NAME:  [name], [name]                        ACCOUNT NO.:  [id number]   MEDICAL RECORD NO.:  [id number]                   PATIENT TYPE:  INP   LOCATION:  [place]                                 FACILITY:  [facility]   PHYSICIAN:  Virgie Dad. Magrinat, M.D.            DATE OF BIRTH:  24-Sep-1931   DATE OF ADMISSION:  03/09/2002  DATE OF DISCHARGE:                                 DISCHARGE SUMMARY   DISCHARGE DIAGNOSES:  1. Leiomyosarcoma of the uterus.  2. Hypertension.  3. History of a heart murmur.  4. Hypothyroidism.  5. History of remote accident with blindness in the left eye.  6. Status post Port-A-Cath placement.  7. Status post tonsillectomy and adenoidectomy.  8. History of benign biopsy of a left anterior s_________ lymph node 1960.  9. Status post bilateral blepharoplasty.  10.      Status post dilatation and curettage x1.  11.      Status post bilateral cataract surgery.  12.      Hypokalemia.   PROCEDURE:  Intravenous chemotherapy.   HOSPITAL COURSE:  The patient was admitted and after appropriate pre  medications received doxorubicin 50 sq mg for a total dose of 95 mg  intravenously followed by iphosphamide 5 sq g for a total dose of 9.6 g  given over 24 hours with an equal amount of mesna followed by mesna a total  of 5 g intravenously given over 12 hours.   The patient tolerated treatment well with mild nausea, no vomiting, no  problems with report, minimal evidence of fluid overload corrected with a  single dose of Bumex.  No mental status changes.  No hematuria.   The patient's potassium on admission was 3.4 and she was placed on potassium  supplementation orally t.i.d. which will be continued as an outpatient.   CONDITION ON DISCHARGE:  Stable.   DISCHARGE MEDICATIONS:  The patient is going to continue her home  medications which include Synthroid, Inderal, Prinivil, Oculac, Zoloft, and  Plendil.  She will also be on potassium chloride 10 mEq b.i.d. with meals  and Bumex 2 mg once a day as needed for swelling.  She has other p.r.n.  medications including Ambien and lorazepam.  A second new medication for her  is Promethazine 25 mg which she will take before meals and at bedtime for  the next 48 hours and then as needed.  The patient will use Tylenol ad lib.   ACTIVITY:  Unrestricted.   DIET:  Unrestricted.   WOUND CARE:  Not applicable.   SPECIAL INSTRUCTIONS:  She will call for nausea, fever, or any other  concern.   FOLLOW UP:  She is already scheduled for follow-up in the office next week.  She is aware of that appointment.  Virgie Dad. Magrinat, M.D.    Lenard Galloway  D:  03/11/2002  T:  03/11/2002  Job:  [id number]   cc:   Marti Sleigh, M.D.  [place].  [place]  [place] [address]  Fax: [place] Maretta Bees, M.D.  [place], [address] [address]  [place]  [place] [address]  Fax: [phone]   Delanna Ahmadi, M.D.  [address] [place] [address]  Fax: [phone]

## 2010-07-13 NOTE — Consult Note (Signed)
NAMETRAYANA, BOLEY              ACCOUNT NO.:  [id number]   MEDICAL RECORD NO.:  [id number]          PATIENT TYPE:  OUT   LOCATION:  GYN                          FACILITY:  [facility]   PHYSICIAN:  Marti Sleigh, M.D.DATE OF BIRTH:  09/29/1931   DATE OF CONSULTATION:  09/21/2004  DATE OF DISCHARGE:                                   CONSULTATION   CHIEF COMPLAINT:  A 77 year old white female returns for continuing follow-  up of leiomyosarcoma of the uterus.   INTERVAL HISTORY:  Since her last visit, the patient has done well. She has  decided that she will discontinue using wheat products and milk products and  she says she feels like she has much more energy.   She denies any abdominal pain or pressure, any vaginal bleeding or  discharge, or any other GI or GU symptoms.   HISTORY OF PRESENT ILLNESS:  The patient underwent TAH/BSO and sigmoid  resection with low rectal anastomosis December 2003 for a leiomyosarcoma of  the uterus. She subsequently was treated with six cycles of adjuvant  chemotherapy using a combination of Adriamycin and ifosfamide completed in  May 2004. She had done well since that time.   She had a CAT scan in April 2004 which was normal except for a small hiatal  hernia.   PAST MEDICAL HISTORY:  Medical illnesses:  Hypertension, hypothyroidism,  obesity.   CURRENT MEDICATIONS:  Synthroid, Inderal, lisinopril, Norvasc, K-Dur, Bumex.   DRUG ALLERGIES:  PENICILLIN, MORPHINE, MYCINS, SULFA, TAPE, and ASPIRIN.   FAMILY HISTORY:  Negative for gynecologic, breast, or colon cancers.   REVIEW OF SYSTEMS:  A 10-point comprehensive review of systems is reviewed  and is negative except as noted above.   PHYSICAL EXAMINATION:  VITAL SIGNS:  Weight 204 pounds, blood pressure  130/80.  GENERAL:  The patient is a healthy white female in no acute distress.  HEENT:  Negative.  NECK:  Supple without thyromegaly.  LYMPH:  There is no supraclavicular or inguinal  adenopathy.  ABDOMEN:  Soft, nontender. No mass, organomegaly, ascites, or hernias are  noted. Midline incision is well healed.  PELVIC:  EG/BUS, vagina, bladder, urethra are normal. Cervix and uterus are  surgically absent. Bimanual and rectovaginal exam reveal no mass,  induration, or nodularity.   IMPRESSION:  Locally advanced leiomyosarcoma of the uterus status post  complete resection and six cycles of Adriamycin/ifosfamide adjuvant  chemotherapy. The patient remains clinically free of disease and has a  reassuring CAT scan from April. She will return to see Dr. Jana Hakim as  previously scheduled and return to see [place] in 6 months.  She will have a CT scan prior to that next visit in 6 months which will  serve as a 3-year follow-up. At that juncture, I think it would be  reasonable to discontinue using CT scans for surveillance.   The patient is seen today with her two daughters. All of their questions are  answered.       DC/MEDQ  D:  09/21/2004  T:  09/21/2004  Job:  [id number]   cc:   Caswell Corwin, R.N.  [place], [place] [address]   [place] Magrinat, M.D.  [place]. [place] [address]  Fax: [phone]   Delanna Ahmadi, M.D.  [address] [place] [address]  Fax: [place] Elba Barman, M.D.  Fax: [phone]   Fabio Asa, M.D.  [address] [place]  [place] [address]  Fax: [phone]

## 2010-07-13 NOTE — Consult Note (Signed)
NAME:  [name], [name]                        ACCOUNT NO.:  [id number]   MEDICAL RECORD NO.:  [id number]                   PATIENT TYPE:  OUT   LOCATION:  GYN                                  [facility]ACILITY[facility]   PHYSICIAN:  Marti Sleigh, M.D.         DATE OF BIRTH:  15-May-1931   DATE OF CONSULTATION:  DATE OF DISCHARGE:                                   CONSULTATION   GYNECOLOGIC ONCOLOGY PROGRAM NOTE:  A 77 year old white female returns for  continuing followup of a leiomyosarcoma of the uterus.   Since her last visit she has done well.  She denies any GI or GU symptoms,  has no pelvic pain, pressure, vaginal bleeding or discharge.  Her functional  status is excellent.  She did have a followup CT scan, on December 13, 2002,  which showed no evidence of metastatic disease.   HISTORY OF PRESENT ILLNESS:  The patient was found to have a locally  advanced leiomyosarcoma arising from the uterus.  She underwent exploratory  laparotomy, January 26, 2002, requiring a TAH-BSO, sigmoid resection with  low rectal anastomosis.  She was then treated with six cycles of adjuvant  chemotherapy using a combination of Adriamycin and ifosfamide.  Chemotherapy  was completed in May 2004.   PAST MEDICAL ILLNESSES:  1. Hypertension.  2. Hypothyroidism.   CURRENT MEDICATIONS:  Synthroid, Inderal, Lisinopril, Plendil, K-Dur,  Zoloft, and Ambien.   DRUG ALLERGIES:  1. PENICILLIN.  2. MORPHINE.  3. MYCINS.  4. SULFA.  5. TAPE.  6. ASPIRIN.   FAMILY HISTORY:  Negative for gynecologic cancers.  It is positive for  breast and colon cancer.   REVIEW OF SYSTEMS:  Essentially negative.   PHYSICAL EXAMINATION:  VITAL SIGNS:  Weight 183 pounds.  GENERAL:  The patient is a healthy white female in no acute distress.  HEENT:  Negative.  NECK:  Supple without thyromegaly, supraclavicular or inguinal adenopathy.  ABDOMEN:  Soft, nontender.  No mass, organomegaly, ascites or hernias are  noted.  PELVIC:  EG, BUS, vagina, bladder and urethra are normal.  Cervix and uterus  surgically absent.  Bimanual and rectovaginal exam reveal no masses,  induration or nodularity.   IMPRESSION:  A locally advanced leiomyosarcoma of the uterus, status post  complete resection and six cycles of Adriamycin and ifosfamide adjuvant  chemotherapy.  The patient is clinically free of disease.   She will return to see Dr. Jana Hakim in three months and return to see [place] in  six months.  We will obtain a CAT scan prior to that visit in six months.                                               Marti Sleigh, M.D.    DC/MEDQ  D:  02/09/2003  T:  02/09/2003  Job:  [id number]

## 2010-07-13 NOTE — Discharge Summary (Signed)
NAME:  [name], [name]                        ACCOUNT NO.:  [id number]   MEDICAL RECORD NO.:  [id number]                   PATIENT TYPE:  INP   LOCATION:  [place]                                 FACILITY:  [facility]   PHYSICIAN:  Lowell C. Pearlie Oyster, M.D.               DATE OF BIRTH:  [birth year]   DATE OF ADMISSION:  03/30/2002  DATE OF DISCHARGE:                                 DISCHARGE SUMMARY   DISCHARGE DIAGNOSES:  1. Leiomyosarcoma on chemotherapy.  2. Nausea secondary to number one, controlled.  3. Hypertension.  4. History of heart murmur.   SUMMARY:  The patient is a 77 year old female patient of Sarajane Jews C. Magrinat,  M.D. admitted March 30, 2002 electively for chemotherapy.  She is admitted  for a second cycle adriamycin iphosphamide for a leiomyosarcoma grade 3  status post total abdominal hysterectomy, bilateral salpingo-oophorectomy at  diagnosis at December 2003.  She will be due to be restaged after cycle  three.   She tolerated cycle one fairly well with some nausea post discharge which we  felt we could address after this cycle of chemotherapy was completed.   Her admission examination was unremarkable.   Her overall initial staging was T2 B NX M0 for a stage III status.  She had  focally positive margins.   HOSPITAL COURSE:  The patient received her iphosphamide and adriamycin  uneventfully.  Iphosphamide was given at 5 sq g and then she was given Mesna  over 24 hours following.  Day one she received doxorubicin 50 sq mg.   She had developed a little bit of cystitis type symptoms probably from  chemical irrigation of her bladder.  A urinalysis did not show any blood  during this admission nor did she show a urinary tract infection.  TSH was  checked on March 30, 2002 and was 0.726, within normal range.  White count  was 7.9.  Hemoglobin 11.6, platelet count 323,000.  Sodium 138, potassium  3.1, creatinine 1.0.   She was repleted with potassium while she was  here 20 mEq p.o. b.i.d.   Her blood pressure remain normal throughout her admission.  Urine output was  good.   Subsequently, she completed her chemotherapy early in the a.m. on April 01, 2002 without incident and is being discharged today on Zofran 8 mg  tablets ODT formulation one under the tongue q.8h. for three days around the  clock, then q.8h. as needed., Pyridium 100 mg p.o. q.8h. for two days.  Other medications will be resumed at her home dose.   ACTIVITY:  No restriction.   DIET:  She will push liquids and drink an extra liter of fluid a day.   DIAGNOSIS:  She will call [phone] for problems or questions.  Return to see  Virgie Dad. Magrinat, M.D. per her prior schedule which is already set up  through the month of February.   CONDITION ON  DISCHARGE:  At the time of discharge overall status is  improved.   PROGNOSIS:  Guarded.                                               Lowell C. Pearlie Oyster, M.D.    LCS/MEDQ  D:  04/01/2002  T:  04/01/2002  Job:  [id number]   cc:   Virgie Dad. Magrinat, M.D.  [place]. [place] [address]  Fax: [phone]   Delanna Ahmadi, M.D.  [address] [place] [address]  Fax: [employer] Maretta Bees, M.D.  [place], [place]  [place] [address]  Fax: [phone]   Marti Sleigh, M.D.  [place]. [employer].  [place]  [place] [address]  Fax: [place], M.D.  [address] [place]  [place] [address]  Fax: [phone]

## 2010-07-13 NOTE — H&P (Signed)
NAME:  [name], [name]                        ACCOUNT NO.:  [id number]   MEDICAL RECORD NO.:  [id number]                   PATIENT TYPE:  INP   LOCATION:  0271                                 FACILITY:  [facility]   PHYSICIAN:  Virgie Dad. Magrinat, M.D.            DATE OF BIRTH:  03-Jun-1931   DATE OF ADMISSION:  06/08/2002  DATE OF DISCHARGE:                                HISTORY & PHYSICAL   HISTORY OF PRESENT ILLNESS:  The patient is a 77 year old [name], [name], woman with a history of uterine leiomyosarcoma.  She underwent  exploratory laparotomy with total abdominal hysterectomy and bilateral  salpingo-oophorectomy in December of 2003.  She also had partial resection  of her sigmoid at that time for a T2, BNX tumor grade III measuring 10 cm  with focally positive margins and approximately 20 mitoses per high powered  field.  The patient is being treated with adjuvant Adriamycin/iphosphamide  and today is being admitted for cycle #5 of six cycles planned.   PAST MEDICAL HISTORY: old [name], [name], woman with a history of uterine leiomyosarcoma.  She underwent  exploratory laparotomy with total abdominal hysterectomy and bilateral  salpingo-oophorectomy in December of 2003.  She also had partial resection  of her sigmoid at that time for a T2, BNX tumor grade III measuring 10 cm  with focally positive margins and approximately 20 mitoses per high powered  field.  The patient is being treated with adjuvant Adriamycin/iphosphamide  and today is being admitted for cycle #5 of six cycles planned.   PAST MEDICAL HISTORY:  1. Anemia requiring transfusion.  2. Port placement.  3. Hypertension.  4. Hypothyroidism.  5. Chronic heart murmur.  6. Remote accident to the left eye.  7. Tonsillectomy.  8. Left anterior scalene lymph node biopsy remotely.  9. History of D&C.  10.      History of bilateral cataract surgery.   ALLERGIES:  PENICILLIN, SULFA, TAPE, ASPIRIN, MYCINS, EGGS.   MEDICATIONS:  She is on Synthroid, Bumex, Inderal, lisinopril which has been  held currently, Plendil, potassium, Zoloft, Indocin, Ambien, Lorazepam, and  Promethazine.   REVIEW OF SYSTEMS:  She has tolerated chemotherapy well so far but has  become progressively more tired.  It has become harder for her to get to  work, although she is still going to work at least part time.  Luckily she  has a very flexible working arrangement and a very understanding boss.  She has been a bit nauseated she thinks because she has been on Cipro  for  some time, and since we are stopping that medication, we should be able to  verify that one way or the other.  There has been no dizziness,  palpitations, chest pain or pressure.  She is not aware of any change in  bowel or bladder habits or any bleeding problems.  There has been no fever.   PHYSICAL EXAMINATION:  GENERAL APPEARANCE:  A middle aged white female.  VITAL SIGNS:  Her weight is stable at 179, temperature is 98.4, pulse 86 and  respiratory rate is 20.  Her blood pressure is 168/90.  HEENT:  Sclerae are clear.  NECK:  Supple.  No peripheral adenopathy.  LUNGS:  No crackles or wheezes.  CARDIOVASCULAR:  Regular rate and rhythm.  BREAST:  Examination deferred.  ABDOMEN:  Soft, nontender with positive bowel sounds.  MUSCULOSKELETAL:  There is 1 to 2+ bilateral lower extremity edema which is  chronic.   LABORATORY DATA:  Pending.   IMPRESSION AND PLAN:  A 77 year old [name], [name], woman with a  history of high grade leiomyosarcoma completely resected December of 2003,  receiving adjuvant chemotherapy and admitted for cycle #5 of six planned.   Her most recent hemoglobin was 8.38 and I am sure it will be less than that  with hydration.  We are accordingly proceeding to transfusion first and then  chemotherapy which will be started on June 09, 2002.  She will return for  Aranesp in our office on June 14, 2002, or June 15, 2002, (she will call  with which ever date is most convenient for her) and I will see her on the  same day.  We will then set her up for her final cycle of treatment and for  restaging studies. [place], woman with a  history of high grade leiomyosarcoma completely resected December of 2003,  receiving adjuvant chemotherapy and admitted for cycle #5 of six planned.   Her most recent hemoglobin was 8.38 and I am sure it will be less than that  with hydration.  We are accordingly proceeding to transfusion first and then  chemotherapy which will be started on June 09, 2002.  She will return for  Aranesp in our office on June 14, 2002, or June 15, 2002, (she will call  with which ever date is most convenient for her) and I will see her on the  same day.  We will then set her up for her final cycle of treatment and for  restaging studies.                                               Virgie Dad. Magrinat, M.D.    Lenard Galloway  D:  06/08/2002  T:  06/08/2002  Job:  [id number]   cc:   Delanna Ahmadi, M.D.  [address] [place] [address]  Fax: [place] Maretta Bees, M.D.  [place], [place]  [place] [address]  Fax: [phone]   Marti Sleigh, M.D.   Fabio Asa, M.D.  [address]  [place] [address]  Fax: [phone]

## 2010-07-13 NOTE — Consult Note (Signed)
NAME:  [name], [name]                        ACCOUNT NO.:  [id number]   MEDICAL RECORD NO.:  [id number]                   PATIENT TYPE:  OUT   LOCATION:  XRAY                                 FACILITY:  [facility]   PHYSICIAN:  Marti Sleigh, M.D.         DATE OF BIRTH:  Jun 27, 1931   DATE OF CONSULTATION:  02/15/2002  DATE OF DISCHARGE:                                   CONSULTATION   REASON FOR CONSULTATION:  36 white female returns two  weeks postop, having undergone a total abdominal hysterectomy and bilateral  salpingo-oophorectomy and sigmoid colectomy for surgical management of a  high-grade leiomyosarcoma of the uterus.  At the time of surgical  exploration on December 2nd, she was found to have tumor extending from the  uterus involving the sigmoid colon and its mesentery and into the right  adnexa, parametria and bladder flap.  All gross disease was resected.  Final  pathology showed this to be a poorly differentiated sarcoma with up to 20  mitotic figures per 10 high-power fields.  The patient has had an  uncomplicated postoperative course.  Specifically, she has returned to  normal bowel function, is not having any difficulty with diarrhea or  constipation.   PAST MEDICAL HISTORY:  Medical illnesses:  Hypertension, cardiac murmur  (asymptomatic), left eye visual loss.   PAST SURGICAL HISTORY:  Tonsils and adenoidectomy, corrective surgery on  eyelids, cataract/replacement on left and right with lenses, lymph node  biopsy, D&C.   DISCHARGE ACTIVITY:  PENICILLIN, MORPHINE, SULFA, ASPIRIN, CODEINE,  NOVOCAIN, VACCINES and ADHESIVE TAPE.   CURRENT MEDICATIONS:  1. Synthroid 75 mcg q.a.m.  2. Bumex 2 mg daily.  3. Inderal LA 120 mg q.h.s.  4. Prinivil 20 mg every day.  5. Plendil 2.5 daily.  6. K-Dur 20 mEq daily.  7. Ocular X one tab daily.  8. B complex and multivitamin.   SOCIAL HISTORY:  The patient is single.  She does not smoke.   REVIEW OF SYSTEMS:  Review of systems is essentially negative except for an  asymptomatic cardiac murmur.  The patient underwent an echocardiogram and  Doppler study on December 1st which revealed mild aortic valve sclerosis,  normal chamber dimensions, and a gated ejection fraction of 78%, based on  stress Cardiolite study on January 20, 2002.   PHYSICAL EXAMINATION:  VITAL SIGNS:  Height 5 feet 3 inches.  Weight 191  pounds.  Blood pressure 140/60, pulse 60.  GENERAL:  The patient is a healthy white female in no acute distress.  HEENT:  Negative.  NECK:  Neck is supple without thyromegaly.  CHEST:  Chest is clear to percussion and auscultation.  CARDIAC:  Exam reveals a grade 3/6 systolic murmur.  ABDOMEN:  Abdomen is soft and nontender.  Midline incision is healing well.  PELVIC:  Exam is deferred.   IMPRESSION:  High-grade leiomyosarcoma of the uterus invading the sigmoid  colon, completely resected.   PLAN:  I had a lengthy discussion with the patient and her daughters  regarding the natural history of this disease.  They are aware that this is  a very aggressive malignancy and that the recurrence rate is exceedingly  high.  Further, they are aware that this is not a disease that is  successfully treated when it does recur.   Further, while the desire to provide adjuvant chemotherapy seems very  reasonable, they are also aware that there are no trials to date  demonstrating clearcut benefit using adjuvant chemotherapy.  Despite all of  this uncertainty, I think it is reasonable to offer the patient adjuvant  chemotherapy and would recommend using the treatment previously used in a  clinical trial protocol at [facility] using a combination of Adriamycin and  ifosfamide administered for six cycles.  The pros and cons of this approach  were discussed at length with the patient and her daughters; the risks of  chemotherapy were also outlined in detail.  They understand that I cannot   demonstrate to them that there is an improved survival, but nonetheless,  they would like to pursue this strategy.   The patient has an appointment scheduled to see Dr. Sarajane Jews C. Magrinat next  week.  We will forward the protocol to Dr. Jana Hakim for his review.                                               Marti Sleigh, M.D.    DC/MEDQ  D:  02/15/2002  T:  02/15/2002  Job:  [id number]   cc:   Virgie Dad. Magrinat, M.D.  [place]. [place] [address]  Fax: [place]. Maretta Bees, M.D.  [place], [address]  [place]  [place] [address]  Fax: [phone]   Delanna Ahmadi, M.D.  [address] [place]  [place] [address]  Fax: [place], R.N.  [address] [place], [place] [address]  Fax: 1

## 2010-07-13 NOTE — Discharge Summary (Signed)
NAME:  [name], [name]                        ACCOUNT NO.:  [id number]   MEDICAL RECORD NO.:  [id number]                   PATIENT TYPE:  INP   LOCATION:  0271                                 FACILITY:  [facility]   PHYSICIAN:  Virgie Dad. Magrinat, M.D.            DATE OF BIRTH:  1931-11-26   DATE OF ADMISSION:  06/08/2002  DATE OF DISCHARGE:  06/10/2002                                 DISCHARGE SUMMARY   DISCHARGE DIAGNOSES:  1. Sarcoma requiring chemotherapy.  2. Anemia requiring transfusion.  3. Status post port placement.  4. History of hypertension.  5. Hypothyroidism.  6. Chronic heart murmur.  7. Remote accident to the left eye.  8. Tonsillectomy.  9. Left anterior scalene lymph node biopsy remotely.  10.      History of dilatation and curettage.  11.      History of bilateral cataract surgery.   PROCEDURE:  1. Transfusion of blood products.  2. Intravenous chemotherapy.   HOSPITAL COURSE:  The patient was admitted and found to be severely anemic.  She received 2 units of packed red cells prior to initiation of her  chemotherapy.  On June 09, 2002 she was started on her pre medications and  then received doxorubicin at 50 sq mg for a total dose of 95 mg IV followed  by iphosphamide 5 sq g for a total dose of 9500 mg intravenously with Mesna  equal amount in the same bag over 24 hours followed by Mesna 5 g  intravenously over 12 hours.  The patient tolerated the transfusion and the  treatment without any unusual toxicities.  At the time of discharge the  patient is afebrile with normal vital signs.   CONDITION ON DISCHARGE:  Stable.   DISCHARGE MEDICATIONS:  1. In addition to her home medications which have not changed the patient     has Promethazine and Zofran to take as needed for nausea.   PAIN MANAGEMENT:  The patient will use Tylenol or Aleve as needed for pain.   ACTIVITY:  As tolerated.   DIET:  Unrestricted.   WOUND CARE:  Not applicable.   SPECIAL  INSTRUCTIONS:  She will call for nausea, pain, fever, or any other  problems.   FOLLOW UP:  She will come to our office Saturday, April 17 for her Neulasta  and Aranesp and she will see me in 10-14 days to set her up for final cycle  of treatment.                                               Virgie Dad. Magrinat, M.D.    Lenard Galloway  D:  06/10/2002  T:  06/10/2002  Job:  [id number]   cc:   Delanna Ahmadi, M.D.  [address]  [place]  [place] [address]  Fax: [place] Maretta Bees, M.D.  [place], [place]  [place] [address]  Fax: [phone]   Marti Sleigh, M.D.   Fabio Asa, M.D.  [address] [place]  [place] [address]  Fax: [phone]

## 2010-07-13 NOTE — Consult Note (Signed)
[name], [name]              ACCOUNT NO.:  [id number]   MEDICAL RECORD NO.:  [id number]          PATIENT TYPE:  OUT   LOCATION:  GYN                          FACILITY:  [facility]   PHYSICIAN:  Marti Sleigh, M.D.DATE OF BIRTH:  [birth year]   DATE OF CONSULTATION:  12/20/2005  DATE OF DISCHARGE:  12/20/2005                                   CONSULTATION   CHIEF COMPLAINT:  Leiomyosarcoma of the uterus.   HISTORY OF PRESENT ILLNESS:  Since her last visit, the patient has done  well. Over the last few days, she has had symptoms consistent with urinary  tract infection with dysuria, fatigue, and bladder discomfort and pain.  Otherwise, her functional status has been excellent. She specifically denies  any GARG symptoms. Has no pelvic pain pressure or vaginal bleeding or  discharge. She recently had [name] chest x-ray and chest CT scan that were  negative.   HISTORY OF PRESENT ILLNESS:  The patient underwent [name] TAH, BSO, and sigmoid  resection with low rectal anastomosis December 2004 for leiomyosarcoma of  the uterus. Her tumor was completely resected and she received 6 cycles of  adjuvant chemotherapy using [name] combination of Adriamycin and Ifosfamide  completed in May 2004. Since that time, she has been followed with serial CT  scans, although we discontinued them at our last visit. She has remained  entirely asymptomatic.   PAST MEDICAL HISTORY/PAST MEDICAL ILLNESSES:  Hypertension, hyperthyroidism,  and obesity.   CURRENT MEDICATIONS:  Synthroid, Inderal, Lisinopril, Norvasc, K-Dur, and  Bumex.   ALLERGIES:  PENICILLIN, MORPHINE, MYCIN, SULFA, TAPE, ASPIRIN.   FAMILY HISTORY:  Negative for gynecologic, breast, or colon cancer.   REVIEW OF SYSTEMS:  [name] 10 point comprehensive review of systems is negative  except as noted above.   SOCIAL HISTORY:  The patient is single and comes accompanied by one of her  adult daughters today.   PHYSICAL EXAMINATION:  VITAL SIGNS:  Weight  204 pounds. Blood pressure  130/70. Pulse 70 and respiratory rate 18.  GENERAL:  The patient is [name] healthy white female in no acute distress.  HEENT:  Negative.  NECK:  Supple without thyromegaly.  LYMPH: There is no supraclavicular or inguinal adenopathy.  ABDOMEN:  Obese, soft, non-tender. No mass, organomegaly, ascites, or  hernias are noted.  PELVIC:  EG, BUS, vagina, bladder, and urethra are normal. Cervix and uterus  are surgically absent. Adnexa without masses. Recto-vaginal exam confirms.   IMPRESSION:  Leiomyosarcoma, no evidence of recurrent disease.   FOLLOWUP:  1. The patient to return to see Dr. Gunnar Bulla Magrinat in 3 months.  2. The patient to return to see me in 9 months.   DISCHARGE MEDICATIONS:  The patient given [name] prescription for ciprofloxacin  250 mg t.i.d. for the next 5 days.      Marti Sleigh, M.D.  Electronically Signed     DC/MEDQ  D:  12/20/2005  T:  12/22/2005  Job:  [id number]   cc:   Virgie Dad. Magrinat, M.D.  Fax: [phone]   Caswell Corwin, R.N.  [address], [place] [address]  Ulyess Blossom. Elba Barman, M.D.  Fax: [phone]   Fabio Asa, M.D.  Fax: [phone]

## 2010-07-13 NOTE — Discharge Summary (Signed)
NAME:  [name], [name]                        ACCOUNT NO.:  [id number]   MEDICAL RECORD NO.:  [id number]                   PATIENT TYPE:  INP   LOCATION:  0275                                 [facility]ACILITY[facility]  [facility]   PHYSICIAN:  Aquilla Hacker, M.D.               DATE OF BIRTH:  [birth year]   DATE OF ADMISSION:  05/18/2002  DATE OF DISCHARGE:                                 DISCHARGE SUMMARY   CHIEF COMPLAINT AND FINAL DIAGNOSIS:  The patient with underlying  leiomyosarcoma was admitted for chemotherapy on her fourth-course cycle.   HISTORY OF PRESENT ILLNESS:  The patient is a 77 year old white female with  a history of uterine leiomyosarcoma status post exploratory laparotomy with  total abdominal hysterectomy and bilateral salpingo-oophorectomy and she  also had a partial resection of the sigmoid in December 2003.  She had a  tumor of T2b, NX, grade 3 measuring 10 cm with a focally positive margin and  with the approximate 20 mitosis with a high-power field.  The patient was  treated with the adjuvant chemotherapy followed by restaging and radiation  therapy.  The patient admitted to the hospital on this admission for fourth  cycle of chemotherapy.   HOSPITAL COURSE:  The patient's hospital course was uneventful.  The patient  tolerated her treatment of the chemotherapy well with no evidence of  symptoms or signs, except that she had minimal edema of the lower  extremities which has been resolved.  She was treated with Zofran, Decadron,  and Ativan IV followed by doxorubicin as well as ifosfamide.  The  chemotherapy was started on admission on 05/18/2002, with (1) Zofran 8 mg IV  and Decadron 10 mg IV, Ativan 0.5 mg IV pre-chemo and q.a.m. for 3 days, (2)  doxorubicin 50 mg/sq m equal to 95 mg IV followed by, (3) ifosfamide 5  mg/sq m equal to 9500 mg in the same bag over 24 hours following that was  given with Mesna 9500 mg, subsequently followed by Mesna 5 g IV over 12  hours and  Reglan 20 mg IV with Ativan 0.5 mg IV every 6 hours p.r.n.  The  patient today she did well, her vital signs stable, there is no evidence of  complaint of soreness or skin rash or nausea or vomiting and however, she  always had Zofran orally by mouth 8 mg q.8h. for 3 days post-chemo and  prescription was written for the patient.  The patient was stable on  discharge with a blood pressure 121/55, temperature 97.3, and pulse 57,  pulse oximeter was 96%.  The patient is stable on discharge and she will be  followed today in the clinic by Dr. Jana Hakim as well as will be receiving  the next chemotherapy in 3 weeks as well and she will continue her home  medication.   CURRENT MEDICATIONS DURING HOSPITALIZATION:  Synthroid she is on 75 mcg  daily.  She is also on Inderal 125 mg at bedtime.  She is on lisinopril 20  mg daily and she was on Plendil 2.5 mg daily and potassium chloride 10 mEq  b.i.d., Zoloft 100 mg daily, multivitamin once a day.  She was on Senokot-S  one tablet at bedtime and the Ativan was given during her IV infusion.  She  is also on Protonix 40 mg once a day.  During her hospital course she had  some excoriation on the genital area in the labia minora and labia majora  and on the left side however, there is no evidence of infection and no  evidence of clear erythema and treated with Bactroban cream 2% to be applied  three to four times and she will continue, and she had the tube to be used  on __________ .  She has also Ambien on a p.r.n. basis during her hospital  stay.  She was also on Bumex at home 2 mg on a p.r.n. basis.   ALLERGIES:  PENICILLIN, SULFA, TAPE, ASPIRIN, MYCIN, EGGS, and ADHESIVE  TAPE.                                               Aquilla Hacker, M.D.    Marcellina Millin  D:  05/20/2002  T:  05/20/2002  Job:  [id number]   cc:   Virgie Dad. Magrinat, M.D.  [place]. [place] [address]  Fax: [phone]   Marti Sleigh, M.D.   Delanna Ahmadi, M.D.  [address] [place] [address]  Fax: [place] Maretta Bees, M.D.  [place] [place], [address]  [place]  [place] [address]  Fax: [phone]

## 2010-07-13 NOTE — H&P (Signed)
NAME:  BLERTA, ANTWINE                        ACCOUNT NO.:  [id number]   MEDICAL RECORD NO.:  [id number]                   PATIENT TYPE:  INP   LOCATION:  0260                                 FACILITY:  [facility]   PHYSICIAN:  Virgie Dad. Magrinat, M.D.            DATE OF BIRTH:  16-Dec-1931   DATE OF ADMISSION:  05/18/2002  DATE OF DISCHARGE:                                HISTORY & PHYSICAL   HISTORY OF PRESENT ILLNESS:  The patient is a 77 year old [name] woman  with a history of uterine leiomyosarcoma, status post exploratory laparotomy  with total abdominal hysterectomy and bilateral salpingo-oophorectomy and  partial resection of her sigmoid in 12/03, for a T2B NX tumor, grade 3,  measuring 10 cm with focally positive margins.  There were approximately 20  mitoses per high powered field.  The patient is being treated adjuvantly  with adriamycin and ifosfamide, and is being admitted for her fourth of  sixth cycles of chemotherapy to be followed by restaging and radiation  treatment.   PAST MEDICAL HISTORY:  1. Port placement.  2. Hypertension.  3. Hypothyroidism.  4. Chronic heart murmur.  5. Remote accident to the left eye.  6. Tonsillectomy.  7. History of left anterior scaline lymph node biopsy remotely for a benign     unknown condition.  8. History of D&C in the past.  9. History of bilateral cataract surgery.   ALLERGIES:  1. PENICILLIN.  2. SULFA.  3. TAPE.  4. ASPIRIN.  5. MYCINS.  6. EGGS.  7. ADHESIVE TAPE.   MEDICATIONS:  1. Synthroid 75 mcg daily.  2. Bumex 2 mg p.r.n.  3. Inderal LA 120 mg daily.  4. Lisinopril 20 mg daily.  5. Plendil 2.5 mg daily.  6. Potassium 10 mEq b.i.d.  7. Zoloft 100 mg daily.  8. Indocid p.r.n.  9. Ambien p.r.n.  10.      Lorazepam p.r.n.  11.      Promethazine p.r.n.   REVIEW OF SYMPTOMS:  She was bitten by her cat and has received a tetanus  shot since her last admission, but she has had no fever, no nausea or  vomiting, no significant problems with fatigue (she has been able to  continue to work up to five hours a day, and indeed worked two hours today).  No rash, no other problems suggestive of infection.  She has had no change  in bowel or bladder habits.  There has been no shortness of breath, cough,  or phlegm production.   PHYSICAL EXAMINATION:  GENERAL:  She is a middle-aged white female who  weighs 180 pounds.  VITAL SIGNS:  Temperature is 97.2, pulse 73, respirations 20, blood pressure  146/74.  HEENT:  The sclerae are not icteric.  Oropharynx is clear.  NECK:  Supple.  I do not palpate any peripheral adenopathy.  LUNGS:  No crackles or wheezes.  HEART:  A 2/6 systolic  murmur previously noted.  ABDOMEN:  Soft, nontender, bowel sounds are positive.  BREASTS:  Deferred.  MUSCULOSKELETAL:  Minimal peripheral edema.  No focal spinal tenderness.  NEUROLOGIC:  Nonfocal.   LABORATORY DATA:  Pending.   IMPRESSION:  A 77 year old [name] woman, status post removal of a T2B NX  leiomyosarcoma through exploratory laparotomy with partial resection of the  sigmoid and total abdominal hysterectomy with bilateral salpingo-  oophorectomy in 12/03, for a 10 cm grade C tumor with 20 mitoses per high  powered field and focally positive margins;  admitted for cycle #4 of 6  planned of adriamycin and ifosfamide which she is tolerating well.   PLAN:  To proceed to a total of six cycles, then restage and consider  radiation treatment.  The patient has a very good understanding of the  above, and is eager to proceed.                                                 Virgie Dad. Magrinat, M.D.    Lenard Galloway  D:  05/18/2002  T:  05/18/2002  Job:  [id number]   cc:   Marti Sleigh, M.D.   Delanna Ahmadi, M.D.  [address] [place] [address]  Fax: [place] Maretta Bees, M.D.  [place], [address]  [place] [address]  Fax: [phone]   Fabio Asa, M.D.   [place] [place]  [place] [address]  Fax: [phone]

## 2010-07-13 NOTE — H&P (Signed)
NAME:  [name], [name]                        ACCOUNT NO.:  [id number]   MEDICAL RECORD NO.:  [id number]                   PATIENT TYPE:  INP   LOCATION:  0271                                 FACILITY:  [facility]   PHYSICIAN:  Virgie Dad. Magrinat, M.D.            DATE OF BIRTH:  04-21-1931   DATE OF ADMISSION:  07/06/2002  DATE OF DISCHARGE:                                HISTORY & PHYSICAL   HISTORY OF PRESENT ILLNESS:  The patient is a 77 year old [name] woman  with a history of leiomyosarcoma of the uterus status post exploratory  laparotomy with total abdominal hysterectomy and bilateral salpingo-  oophorectomy as well as resection of part of the sigmoid colon December of  2003.   She is being treated with chemotherapy adjuvantly per a [facility] protocol and is  being admitted for her sixth and final cycle of iphosphamide Adriamycin.   The interval history is significant for her being able to continue to work  although only part-time.  She is very fatigued.  She has had no intercurrent  fevers, no bleeding, no cough, phlegm production, pleurisy, shortness of  breath, and no significant change in her bladder habits.  She has been  mildly constipated.   PAST MEDICAL HISTORY:  1. Hypertension.  2. History of port placement.  3. History of heart murmur.  4. Hypothyroidism.  5. Remote accident to the left eye.  6. Status post tonsillectomy and adenoidectomy.  7. Status post blepharoplasty.  8. Status post D&C.  9. Status post bilaterally cataract surgery.   ALLERGIES:  She is ALLERGIC to PENICILLIN and PAPER TAPE as well as ANY EGG-  BASED MEDICATIONS.   HOME MEDICATIONS:  1. Synthroid 0.075 mcg.  2. Bumex 2 mg as needed.  3. Inderal LA 120 mg.  4. Lisinopril/hydrochlorothiazide 20/12.5 mg daily.  5. Plendil 2.5 daily.  6. Potassium 10 mEq daily.  7. __________ daily.  8. B-Complex 100 daily.  9. Vitamin C daily.  10.      Zoloft 100 mg daily.  11.      Endafed as  needed.  12.      Ambien as needed.  13.      Lorazepam as needed.  14.      Promethazine as needed.   REVIEW OF SYSTEMS:  As above.  In addition, there have been no unusual  headaches, no new visual changes.  No nausea or vomiting and no problems  with her port.  She has not noticed any significant orthostasis symptoms,  photophobia, stiff neck, or any skin changes.   PHYSICAL EXAMINATION:  VITAL SIGNS:  Weight 172, temperature 97.0, pulse 64,  respirations 18, blood pressure 143/75.  HEENT:  The pupils are not icteric.  The oropharynx clear.  NECK:  Supple.  LYMPHATICS:  No peripheral adenopathy.  LUNGS:  No crackles or wheezes.  HEART:  Regular rate and rhythm.  BREASTS:  Deferred.  ABDOMEN:  Soft, nontender.  No masses are palpated.  MUSCULOSKELETAL:  No peripheral edema.  NEUROLOGICAL:  Nonfocal.   LABORATORY DATA:  Labs are pending.   IMPRESSION:  A 77 year old [name] woman with a history of leiomyosarcoma  T2b NX status post exploratory laparotomy with a total abdominal  hysterectomy, bilateral salpingo-oophorectomy, and partial sigmoid resection  December of 2003 being treated neoadjuvantly and admitted for her final  cycle of chemotherapy.   PLAN:  The plan is to complete the chemotherapy cycle and then have her  evaluated by radiation therapy as per protocol.  She will receive Neulasta  and Aranesp on May 15th in our office and then she will return to see me the  last week in May to make sure her counts are adequate and she is recovering.  Once her anemia has resolved, we will discontinue the Aranesp.                                               Virgie Dad. Magrinat, M.D.    Lenard Galloway  D:  07/06/2002  T:  07/06/2002  Job:  [id number]   cc:   Delanna Ahmadi, M.D.  [address] [place] [address]  Fax: [place] Maretta Bees, M.D.  [place], [place]  [place] [address]  Fax: [phone]   Marti Sleigh, M.D.   Fabio Asa, M.D.  [address] [place]  [place] [address]  Fax: [phone]

## 2010-08-09 ENCOUNTER — Ambulatory Visit: Payer: Medicare Other | Attending: Ophthalmology | Admitting: Occupational Therapy

## 2010-08-09 DIAGNOSIS — H3530 Unspecified macular degeneration: Secondary | ICD-10-CM | POA: Insufficient documentation

## 2010-08-09 DIAGNOSIS — H53419 Scotoma involving central area, unspecified eye: Secondary | ICD-10-CM | POA: Insufficient documentation

## 2010-08-09 DIAGNOSIS — IMO0001 Reserved for inherently not codable concepts without codable children: Secondary | ICD-10-CM | POA: Insufficient documentation

## 2010-08-31 ENCOUNTER — Ambulatory Visit: Payer: Medicare Other | Attending: Gynecology | Admitting: Gynecology

## 2010-08-31 DIAGNOSIS — E0590 Thyrotoxicosis, unspecified without thyrotoxic crisis or storm: Secondary | ICD-10-CM | POA: Insufficient documentation

## 2010-08-31 DIAGNOSIS — I10 Essential (primary) hypertension: Secondary | ICD-10-CM | POA: Insufficient documentation

## 2010-08-31 DIAGNOSIS — H539 Unspecified visual disturbance: Secondary | ICD-10-CM | POA: Insufficient documentation

## 2010-08-31 DIAGNOSIS — Z79899 Other long term (current) drug therapy: Secondary | ICD-10-CM | POA: Insufficient documentation

## 2010-08-31 DIAGNOSIS — E669 Obesity, unspecified: Secondary | ICD-10-CM | POA: Insufficient documentation

## 2010-08-31 DIAGNOSIS — C55 Malignant neoplasm of uterus, part unspecified: Secondary | ICD-10-CM | POA: Insufficient documentation

## 2010-09-04 NOTE — Consult Note (Signed)
  [name], [name]              ACCOUNT NO.:  [id number]  MEDICAL RECORD NO.:  [id number]  LOCATION:  GYN                          FACILITY:  [facility]  PHYSICIAN:  Marti Sleigh, M.D.DATE OF BIRTH:  Dec 02, 1931  DATE OF CONSULTATION:  08/31/2010 DATE OF DISCHARGE:                                CONSULTATION   CHIEF COMPLAINT:  Leiomyosarcoma of the uterus.  INTERVAL HISTORY:  The patient returns today for continuing followup. Since her last visit, she has done well.  She denies any GI or GU symptoms.  Has no pelvic pain, pressure, vaginal bleeding or discharge. Her functional status is good.  She had colonoscopy by Dr. Ronald Lobo last July which was normal.  In the interval, her biggest problem has been further degeneration of her vision.  She has been through OT to adjust to these lifestyle changes.  HISTORY OF PRESENT ILLNESS:  The patient has stage I high-grade leiomyosarcoma initially diagnosed in December 2004.  She underwent TAH- BSO and rectosigmoid resection with a low rectal anastomosis.  All gross disease was resected.  She was then treated in an adjuvant fashion with six cycles of Adriamycin and ifosfamide.  She had been followed since that time with no evidence of recurrent disease.  PAST MEDICAL HISTORY:  Medical illnesses, macular degeneration, hypertension, hyperthyroidism and obesity.  CURRENT MEDICATIONS:  Synthroid, lisinopril, atenolol, bumetanide, potassium, iron, vitamin D.  DRUG ALLERGIES:  PENICILLIN, MORPHINE, MYCINS, SULFA, ADHESIVE TAPE, ASPIRIN, MSG, IODINE, EGGS, DAIRY, ONIONS AND WHEAT.  FAMILY HISTORY:  Negative for gynecologic, breast or colon cancer.  REVIEW OF SYSTEMS:  10-point comprehensive review of systems negative, except as noted above.  PHYSICAL EXAMINATION:  VITAL SIGNS:  Weight 185 pounds, blood pressure 130/70, pulse 68. GENERAL:  The patient is a healthy white female in no acute distress. HEENT:  Negative. NECK:   Supple without thyromegaly.  There is no supraclavicular or inguinal adenopathy. ABDOMEN:  Obese, soft, nontender.  No masses, organomegaly, ascites or hernias are noted. PELVIC:  EGBUS, vagina, bladder, urethra are normal, except for some vaginal vault prolapse.  Cervix and uterus are surgically absent. Adnexa without masses.  Rectovaginal exam confirms. LOWER EXTREMITIES:  Without edema or varicosities.  IMPRESSION:  Stage I high-grade leiomyosarcoma, 2004.  No evidence recurrent disease.  At this juncture, I would release the patient from our practice and followup.  She will continue to see Dr. Laurann Montana for her medical care.  I will be happy to see her back in the future should a problem arise, although I think this is highly unlikely.     Marti Sleigh, M.D.     DC/MEDQ  D:  08/31/2010  T:  08/31/2010  Job:  [id number]  cc:   Delanna Ahmadi, M.D. Fax: [phone]  Chauncey Cruel, M.D. Fax: [phone]  Dr. Sharyne Richters, R.N. [address],  [address]  Electronically Signed by Marti Sleigh M.D. on 09/04/2010 02:02:20 PM

## 2011-03-28 DIAGNOSIS — H353290 Exudative age-related macular degeneration, unspecified eye, stage unspecified: Secondary | ICD-10-CM | POA: Diagnosis not present

## 2011-03-28 DIAGNOSIS — Z961 Presence of intraocular lens: Secondary | ICD-10-CM | POA: Diagnosis not present

## 2011-03-28 DIAGNOSIS — H31019 Macula scars of posterior pole (postinflammatory) (post-traumatic), unspecified eye: Secondary | ICD-10-CM | POA: Diagnosis not present

## 2011-04-02 ENCOUNTER — Other Ambulatory Visit: Payer: Self-pay | Admitting: Internal Medicine

## 2011-04-02 DIAGNOSIS — Z1231 Encounter for screening mammogram for malignant neoplasm of breast: Secondary | ICD-10-CM

## 2011-04-08 DIAGNOSIS — R197 Diarrhea, unspecified: Secondary | ICD-10-CM | POA: Diagnosis not present

## 2011-04-08 DIAGNOSIS — I4891 Unspecified atrial fibrillation: Secondary | ICD-10-CM | POA: Diagnosis not present

## 2011-04-08 DIAGNOSIS — R002 Palpitations: Secondary | ICD-10-CM | POA: Diagnosis not present

## 2011-04-10 ENCOUNTER — Inpatient Hospital Stay (HOSPITAL_COMMUNITY): Payer: Medicare Other

## 2011-04-10 ENCOUNTER — Other Ambulatory Visit: Payer: Self-pay

## 2011-04-10 ENCOUNTER — Encounter (HOSPITAL_COMMUNITY): Payer: Self-pay | Admitting: Emergency Medicine

## 2011-04-10 ENCOUNTER — Inpatient Hospital Stay (HOSPITAL_COMMUNITY)
Admission: EM | Admit: 2011-04-10 | Discharge: 2011-04-11 | DRG: 309 | Disposition: A | Discharge status: Home / Self Care | Payer: Medicare Other | Source: Ambulatory Visit | Attending: Cardiology | Admitting: Cardiology

## 2011-04-10 DIAGNOSIS — N189 Chronic kidney disease, unspecified: Secondary | ICD-10-CM | POA: Diagnosis present

## 2011-04-10 DIAGNOSIS — Z7901 Long term (current) use of anticoagulants: Secondary | ICD-10-CM

## 2011-04-10 DIAGNOSIS — F411 Generalized anxiety disorder: Secondary | ICD-10-CM | POA: Diagnosis present

## 2011-04-10 DIAGNOSIS — I10 Essential (primary) hypertension: Secondary | ICD-10-CM | POA: Diagnosis not present

## 2011-04-10 DIAGNOSIS — H3530 Unspecified macular degeneration: Secondary | ICD-10-CM | POA: Diagnosis present

## 2011-04-10 DIAGNOSIS — I129 Hypertensive chronic kidney disease with stage 1 through stage 4 chronic kidney disease, or unspecified chronic kidney disease: Secondary | ICD-10-CM | POA: Diagnosis present

## 2011-04-10 DIAGNOSIS — Z79899 Other long term (current) drug therapy: Secondary | ICD-10-CM

## 2011-04-10 DIAGNOSIS — N390 Urinary tract infection, site not specified: Secondary | ICD-10-CM | POA: Diagnosis present

## 2011-04-10 DIAGNOSIS — I4891 Unspecified atrial fibrillation: Secondary | ICD-10-CM | POA: Diagnosis not present

## 2011-04-10 DIAGNOSIS — Z7982 Long term (current) use of aspirin: Secondary | ICD-10-CM | POA: Diagnosis not present

## 2011-04-10 DIAGNOSIS — N184 Chronic kidney disease, stage 4 (severe): Secondary | ICD-10-CM

## 2011-04-10 DIAGNOSIS — I517 Cardiomegaly: Secondary | ICD-10-CM | POA: Diagnosis not present

## 2011-04-10 DIAGNOSIS — R000 Tachycardia, unspecified: Secondary | ICD-10-CM | POA: Diagnosis not present

## 2011-04-10 DIAGNOSIS — I4892 Unspecified atrial flutter: Principal | ICD-10-CM | POA: Diagnosis present

## 2011-04-10 HISTORY — DX: Essential (primary) hypertension: I10

## 2011-04-10 HISTORY — DX: Reserved for inherently not codable concepts without codable children: IMO0001

## 2011-04-10 HISTORY — DX: Malignant (primary) neoplasm, unspecified: C80.1

## 2011-04-10 HISTORY — DX: Encounter for other specified aftercare: Z51.89

## 2011-04-10 HISTORY — DX: Adverse effect of unspecified anesthetic, initial encounter: T41.45XA

## 2011-04-10 HISTORY — DX: Disorder of kidney and ureter, unspecified: N28.9

## 2011-04-10 HISTORY — DX: Cardiac arrhythmia, unspecified: I49.9

## 2011-04-10 HISTORY — DX: Hypothyroidism, unspecified: E03.9

## 2011-04-10 HISTORY — DX: Other complications of anesthesia, initial encounter: T88.59XA

## 2011-04-10 HISTORY — DX: Headache: R51

## 2011-04-10 HISTORY — DX: Shortness of breath: R06.02

## 2011-04-10 LAB — URINALYSIS, ROUTINE W REFLEX MICROSCOPIC
Bilirubin Urine: NEGATIVE
Hgb urine dipstick: NEGATIVE
Ketones, ur: 15 mg/dL — AB
Protein, ur: 100 mg/dL — AB
Urobilinogen, UA: 0.2 mg/dL (ref 0.0–1.0)

## 2011-04-10 LAB — DIFFERENTIAL
Basophils Relative: 0 % (ref 0–1)
Lymphs Abs: 1.6 10*3/uL (ref 0.7–4.0)
Monocytes Relative: 6 % (ref 3–12)
Neutro Abs: 8.2 10*3/uL — ABNORMAL HIGH (ref 1.7–7.7)
Neutrophils Relative %: 77 % (ref 43–77)

## 2011-04-10 LAB — CBC
Hemoglobin: 15.4 g/dL — ABNORMAL HIGH (ref 12.0–15.0)
MCHC: 34.2 g/dL (ref 30.0–36.0)
RBC: 4.76 MIL/uL (ref 3.87–5.11)

## 2011-04-10 LAB — PROTIME-INR
INR: 0.94 (ref 0.00–1.49)
Prothrombin Time: 12.8 seconds (ref 11.6–15.2)

## 2011-04-10 LAB — COMPREHENSIVE METABOLIC PANEL
BUN: 20 mg/dL (ref 6–23)
Calcium: 10 mg/dL (ref 8.4–10.5)
Creatinine, Ser: 1.68 mg/dL — ABNORMAL HIGH (ref 0.50–1.10)
GFR calc Af Amer: 33 mL/min — ABNORMAL LOW (ref >90)
Glucose, Bld: 100 mg/dL — ABNORMAL HIGH (ref 70–99)
Total Protein: 7.8 g/dL (ref 6.0–8.3)

## 2011-04-10 LAB — URINE MICROSCOPIC-ADD ON

## 2011-04-10 LAB — MAGNESIUM: Magnesium: 2.5 mg/dL (ref 1.5–2.5)

## 2011-04-10 MED ORDER — ASPIRIN EC 81 MG PO TBEC
81.0000 mg | DELAYED_RELEASE_TABLET | Freq: Every day | ORAL | Status: DC
  Filled 2011-04-10 (×2): qty 1

## 2011-04-10 MED ORDER — METOPROLOL SUCCINATE ER 50 MG PO TB24
50.0000 mg | ORAL_TABLET | Freq: Every day | ORAL | Status: DC
  Administered 2011-04-10: 50 mg via ORAL
  Filled 2011-04-10 (×2): qty 1

## 2011-04-10 MED ORDER — HEPARIN SOD (PORCINE) IN D5W 100 UNIT/ML IV SOLN
1200.0000 [IU]/h | INTRAVENOUS | Status: DC
  Administered 2011-04-10: 1000 [IU]/h via INTRAVENOUS
  Administered 2011-04-11: 1200 [IU]/h via INTRAVENOUS
  Filled 2011-04-10 (×3): qty 250

## 2011-04-10 MED ORDER — LEVOTHYROXINE SODIUM 75 MCG PO TABS
75.0000 ug | ORAL_TABLET | Freq: Every day | ORAL | Status: DC
  Administered 2011-04-11: 75 ug via ORAL
  Filled 2011-04-10 (×2): qty 1

## 2011-04-10 MED ORDER — HEPARIN BOLUS VIA INFUSION
3500.0000 [IU] | Freq: Once | INTRAVENOUS | Status: AC
  Administered 2011-04-10: 3500 [IU] via INTRAVENOUS
  Filled 2011-04-10: qty 3500

## 2011-04-10 MED ORDER — FERROUS SULFATE 325 (65 FE) MG PO TABS
325.0000 mg | ORAL_TABLET | Freq: Every day | ORAL | Status: DC
  Filled 2011-04-10 (×2): qty 1

## 2011-04-10 MED ORDER — DEXTROSE 5 % IV SOLN
5.0000 mg/h | INTRAVENOUS | Status: DC
  Administered 2011-04-10: 5 mg/h via INTRAVENOUS
  Filled 2011-04-10: qty 100

## 2011-04-10 MED ORDER — SODIUM CHLORIDE 0.9 % IJ SOLN
3.0000 mL | Freq: Two times a day (BID) | INTRAMUSCULAR | Status: DC
  Administered 2011-04-10 (×2): 3 mL via INTRAVENOUS

## 2011-04-10 MED ORDER — WARFARIN SODIUM 3 MG PO TABS
3.0000 mg | ORAL_TABLET | Freq: Once | ORAL | Status: AC
  Administered 2011-04-10: 3 mg via ORAL
  Filled 2011-04-10: qty 1

## 2011-04-10 MED ORDER — WARFARIN VIDEO: Freq: Once | Status: DC

## 2011-04-10 MED ORDER — CIPROFLOXACIN HCL 250 MG PO TABS
250.0000 mg | ORAL_TABLET | Freq: Two times a day (BID) | ORAL | Status: DC
  Administered 2011-04-10: 250 mg via ORAL
  Filled 2011-04-10 (×3): qty 1

## 2011-04-10 MED ORDER — DILTIAZEM LOAD VIA INFUSION
15.0000 mg | Freq: Once | INTRAVENOUS | Status: AC
  Administered 2011-04-10: 15 mg via INTRAVENOUS
  Filled 2011-04-10: qty 15

## 2011-04-10 MED ORDER — LISINOPRIL 20 MG PO TABS
20.0000 mg | ORAL_TABLET | Freq: Every day | ORAL | Status: DC
  Administered 2011-04-10: 20 mg via ORAL
  Filled 2011-04-10 (×2): qty 1

## 2011-04-10 MED ORDER — BUMETANIDE 2 MG PO TABS
2.0000 mg | ORAL_TABLET | Freq: Every day | ORAL | Status: DC
  Administered 2011-04-10: 2 mg via ORAL
  Filled 2011-04-10 (×2): qty 1

## 2011-04-10 MED ORDER — POTASSIUM CHLORIDE CRYS ER 20 MEQ PO TBCR
40.0000 meq | EXTENDED_RELEASE_TABLET | Freq: Every day | ORAL | Status: DC
  Filled 2011-04-10: qty 2

## 2011-04-10 MED ORDER — PATIENT'S GUIDE TO USING COUMADIN BOOK
Freq: Once | Status: AC
  Administered 2011-04-10: 18:00:00
  Filled 2011-04-10: qty 1

## 2011-04-10 NOTE — Progress Notes (Signed)
telemetry notified me at 2155 that patient had converted to NSR at [facility]. EKG taken to confirm rhythm . Patient in NSR per EKG reading. MD notified of change in rhythm. Order to continue current treatment till AM.

## 2011-04-10 NOTE — H&P (Signed)
Admit date: 04/10/2011 Primary Physician  Dr. Laurann Montana, John Primary Cardiologist  None  CC: Atrial flutter  HPI: 78 year old no prior CV history with history of nervous feelings and wanting to cry, with HTN, CKD past 10 years after chemo for leiomyosarcoma, who went to Dr. Laurann Montana with heart poking her, moving her in the bed with palpitations. Being going on for 6 -8 months but would usually stop after one hours. No CP. Has DOE with incline since young. Feel dizzy now, ?hungry.   Changed to Toprol from atenolol this year. Was on atenolol for years. Increased dose to 50 on Monday. Diltiazem 120 started Monday. Tuesday morning felt better than she felt for 2 years. Took ASA and went down hill with diarrhea. Had bad diarrhea on Monday.     PMH:   Past Medical History  Diagnosis Date  . Hypertension   . Renal disorder   . Cancer     PSH:  No past surgical history on file. Allergies:  Adhesive; Aspirin; Barbiturates; Eggs or egg-derived products; Epinephrine; Iodinated diagnostic agents; Morphine and related; Oxycontin; Penicillins; Plendil; Pravachol; Soy allergy; Sulfa antibiotics; and Wheat Prior to Admit Meds:   Prescriptions prior to admission  Medication Sig Dispense Refill  . bumetanide (BUMEX) 2 MG tablet Take 2 mg by mouth at bedtime.      . Cholecalciferol (VITAMIN D) 2000 UNITS tablet Take 2,000 Units by mouth daily.      . ciprofloxacin (CIPRO) 250 MG tablet Take 250 mg by mouth 2 (two) times daily as needed. For symptoms      . diltiazem (DILACOR XR) 120 MG 24 hr capsule Take 120 mg by mouth daily.      . ferrous sulfate 325 (65 FE) MG tablet Take 325 mg by mouth daily with breakfast.      . levothyroxine (SYNTHROID, LEVOTHROID) 75 MCG tablet Take 75 mcg by mouth daily.      Marland Kitchen lisinopril (PRINIVIL,ZESTRIL) 20 MG tablet Take 20 mg by mouth daily.      . metoprolol succinate (TOPROL-XL) 50 MG 24 hr tablet Take 50 mg by mouth at bedtime. Take with or immediately following a meal.       . Multiple Vitamins-Minerals (ICAPS) TABS Take 1 tablet by mouth 2 (two) times daily.      . potassium chloride SA (K-DUR,KLOR-CON) 20 MEQ tablet Take 40 mEq by mouth daily.       Fam HX:   No family history on file.Mother MI 34. Sister MI diied 27. Social HX:    History   Social History  . Marital Status: Single    Spouse Name: N/A    Number of Children: N/A  . Years of Education: N/A   Occupational History  . Not on file.   Social History Main Topics  . Smoking status: Never Smoker   . Smokeless tobacco: Not on file  . Alcohol Use: No  . Drug Use:   . Sexually Active:    Other Topics Concern  . Not on file   Social History Narrative  . No narrative on file     ROS:  All 11 ROS were addressed and are negative except what is stated in the HPI  Physical Exam: Blood pressure 142/104, pulse 144, temperature 97.5 F (36.4 C), temperature source Oral, resp. rate 16, height 5\' 3"  (1.6 m), weight 87.408 kg (192 lb 11.2 oz), SpO2 97.00%.    General: Well developed, well nourished, in no acute distress Head: Eyes PERRLA,  No xanthomas.   Normal cephalic and atramatic  Lungs:   Clear bilaterally to auscultation and percussion. Normal respiratory effort. No wheezes, no rales. Heart:  Tachy Reg S1 S2 Pulses are 2+ & equal.            No carotid bruit. No JVD.  No abdominal bruits. No femoral bruits. Abdomen: Bowel sounds are positive, abdomen soft and non-tender without masses or                  Hernia's noted. No hepatosplenomegaly. Msk:  Back normal, normal gait. Normal strength and tone for age. Extremities:  Increased adipose BLE.  DP +1 Neuro: Alert and oriented X 3, non-focal, MAE x 4 GU: Deferred Rectal: Deferred Psych:  Good affect, responds appropriately    Labs:   Lab Results  Component Value Date   WBC 10.7* 04/10/2011   HGB 15.4* 04/10/2011   HCT 45.0 04/10/2011   MCV 94.5 04/10/2011   PLT 236 04/10/2011    Lab 04/10/11 1426  NA 139  K 3.5  CL 101  CO2  25  BUN 20  CREATININE 1.68*  CALCIUM 10.0  PROT 7.8  BILITOT 0.6  ALKPHOS 117  ALT 21  AST 21  GLUCOSE 100*   No results found for this basename: PTT   Lab Results  Component Value Date   INR 0.94 04/10/2011      Radiology:  No results found. CXR pending   EKG:  Aflutter 2:1 144bpm NSSTW changes, no change with prior.  Personally viewed.  ASSESSMENT/PLAN:   Atrial flutter  -Start heparin IV -Diltiazem gtt IV -Coumadin -NPO after midnight for hopeful TEE cardioversion -TSH was normal at office -Read ECHO from office -UA +, will give cipro.   Chronic anticoagulation  -start warfarin given increased CHAD-VAS  HTN -monitor, elevated  CKD -stable chronic, 1.7. From Chemo years ago. On ACE-I per Dr. Laurann Montana.      Candee Furbish, MD  04/10/2011  3:29 PM

## 2011-04-10 NOTE — ED Notes (Signed)
We are to call Dr Marlou Porch upon arrival,  Triage secretary placed call.

## 2011-04-10 NOTE — ED Notes (Signed)
Patient heartrate noted to be 144 on ekg,  Dr Etter Sjogren aware,  Charge nurse aware of orders to be done upon arrival to the  Unit.  AC aware as well.  Patient was a direct admit shortly after arriving to the ED

## 2011-04-10 NOTE — ED Notes (Signed)
Was seen on Monday at dr had an echo  On Monday  Saw her dr today had ekg and then was sent here for admission

## 2011-04-10 NOTE — Progress Notes (Signed)
ANTICOAGULATION CONSULT NOTE - Initial Consult  Pharmacy Consult for Heparin/Warfarin Indication: atrial fibrillation  Allergies  Allergen Reactions  . Adhesive (Tape)     Rash   . Aspirin     Stomach upset  . Barbiturates     Poorly tolerated  . Eggs Or Egg-Derived Products     diarrhea  . Epinephrine     Fever   . Iodinated Diagnostic Agents     Kidney faiilure  . Morphine And Related     Rash   . Oxycontin     Heart race  . Penicillins     Fever   . Plendil     headache  . Pravachol     Muscle ache  . Soy Allergy     shaking  . Sulfa Antibiotics     rash  . Wheat     Stomach upset    Patient Measurements: Height: 5\' 3"  (160 cm) Weight: 192 lb 11.2 oz (87.408 kg) IBW/kg (Calculated) : 52.4  Heparin Dosing Weight: 72 kg  Vital Signs: Temp: 97.5 F (36.4 C) (02/13 1358) Temp src: Oral (02/13 1358) BP: 142/104 mmHg (02/13 1358) Pulse Rate: 144  (02/13 1358)  Labs:  Basename 04/10/11 1426  HGB 15.4*  HCT 45.0  PLT 236  APTT --  LABPROT 12.8  INR 0.94  HEPARINUNFRC --  CREATININE 1.68*  CKTOTAL --  CKMB --  TROPONINI --   Estimated Creatinine Clearance: 29.4 ml/min (by C-G formula based on Cr of 1.68).  Medical History: Past Medical History  Diagnosis Date  . Hypertension   . Renal disorder   . Cancer   . Complication of anesthesia     sensitive to drugs  . Shortness of breath   . Hypothyroidism   . Blood transfusion     " no reaction to transfusion"  . Headache   . Dysrhythmia     atrial flutter    Medications:  Scheduled:    . aspirin EC  81 mg Oral Daily  . bumetanide  2 mg Oral q1800  . ciprofloxacin  250 mg Oral BID  . diltiazem  15 mg Intravenous Once  . ferrous sulfate  325 mg Oral Q breakfast  . levothyroxine  75 mcg Oral QAC breakfast  . lisinopril  20 mg Oral Daily  . metoprolol succinate  50 mg Oral QHS  . potassium chloride SA  40 mEq Oral Daily  . sodium chloride  3 mL Intravenous Q12H    Assessment: Pt  admitted today with Atrial flutter to start on Heparin-->Warfarin and diltiazem drip.  Plan for TEE/DCCV tomorrow.  CHADS2=2.  No hx bleeding noted.  Pt also started on Cipro today for possible UTI.    Goal of Therapy:  INR 2-3;  Heparin level 0.3-0.7   Plan:  1) Heparin 3500 unit IV bolus then 1000 units/hr 2) Check 8hr heparin level 3) Warfarin 3mg  tonight - will initiate at dose 5mg  since pt on Cipro, advanced age and chronic renal insufficiency. 4) Daily heparin level, INR, CBC  5) Initiate warfarin education  Biagio Borg 04/10/2011,5:07 PM

## 2011-04-11 ENCOUNTER — Encounter (HOSPITAL_COMMUNITY)
Admission: EM | Disposition: A | Discharge status: Home / Self Care | Payer: Self-pay | Source: Ambulatory Visit | Attending: Cardiology

## 2011-04-11 DIAGNOSIS — N390 Urinary tract infection, site not specified: Secondary | ICD-10-CM | POA: Diagnosis not present

## 2011-04-11 DIAGNOSIS — I10 Essential (primary) hypertension: Secondary | ICD-10-CM | POA: Diagnosis not present

## 2011-04-11 DIAGNOSIS — N189 Chronic kidney disease, unspecified: Secondary | ICD-10-CM | POA: Diagnosis not present

## 2011-04-11 DIAGNOSIS — I4892 Unspecified atrial flutter: Secondary | ICD-10-CM | POA: Diagnosis not present

## 2011-04-11 LAB — BASIC METABOLIC PANEL
BUN: 17 mg/dL (ref 6–23)
Calcium: 9.6 mg/dL (ref 8.4–10.5)
Chloride: 99 mEq/L (ref 96–112)
Creatinine, Ser: 1.56 mg/dL — ABNORMAL HIGH (ref 0.50–1.10)
GFR calc Af Amer: 36 mL/min — ABNORMAL LOW (ref >90)
GFR calc non Af Amer: 31 mL/min — ABNORMAL LOW (ref >90)

## 2011-04-11 LAB — PROTIME-INR
INR: 1.05 (ref 0.00–1.49)
Prothrombin Time: 13.9 seconds (ref 11.6–15.2)

## 2011-04-11 LAB — CBC
HCT: 41.8 % (ref 36.0–46.0)
MCHC: 32.8 g/dL (ref 30.0–36.0)
Platelets: 194 10*3/uL (ref 150–400)
RDW: 14.1 % (ref 11.5–15.5)
WBC: 9.6 10*3/uL (ref 4.0–10.5)

## 2011-04-11 LAB — HEPARIN LEVEL (UNFRACTIONATED): Heparin Unfractionated: 0.22 IU/mL — ABNORMAL LOW (ref 0.30–0.70)

## 2011-04-11 SURGERY — ECHOCARDIOGRAM, TRANSESOPHAGEAL
Anesthesia: Moderate Sedation

## 2011-04-11 MED ORDER — CIPROFLOXACIN HCL 250 MG PO TABS: 250.0000 mg | ORAL_TABLET | Freq: Two times a day (BID) | ORAL | Status: AC

## 2011-04-11 MED ORDER — DILTIAZEM HCL ER 120 MG PO CP24: 120.0000 mg | ORAL_CAPSULE | Freq: Two times a day (BID) | ORAL | Status: DC

## 2011-04-11 MED ORDER — HEPARIN BOLUS VIA INFUSION
1500.0000 [IU] | Freq: Once | INTRAVENOUS | Status: AC
  Administered 2011-04-11: 1500 [IU] via INTRAVENOUS
  Filled 2011-04-11: qty 1500

## 2011-04-11 MED ORDER — POTASSIUM CHLORIDE CRYS ER 20 MEQ PO TBCR: 40.0000 meq | EXTENDED_RELEASE_TABLET | Freq: Once | ORAL | Status: DC

## 2011-04-11 MED ORDER — DILTIAZEM HCL ER COATED BEADS 120 MG PO CP24
120.0000 mg | ORAL_CAPSULE | Freq: Every day | ORAL | Status: DC
  Filled 2011-04-11: qty 1

## 2011-04-11 MED ORDER — WARFARIN SODIUM 3 MG PO TABS
3.0000 mg | ORAL_TABLET | Freq: Once | ORAL | Status: DC
  Filled 2011-04-11: qty 1

## 2011-04-11 MED ORDER — ASPIRIN 81 MG PO TBEC: 81.0000 mg | DELAYED_RELEASE_TABLET | Freq: Every day | ORAL | Status: AC

## 2011-04-11 MED ORDER — WARFARIN SODIUM 3 MG PO TABS: 3.0000 mg | ORAL_TABLET | Freq: Once | ORAL | Status: DC

## 2011-04-11 MED ORDER — DILTIAZEM HCL 60 MG PO TABS
60.0000 mg | ORAL_TABLET | Freq: Three times a day (TID) | ORAL | Status: DC
  Administered 2011-04-11: 60 mg via ORAL
  Filled 2011-04-11 (×4): qty 1

## 2011-04-11 NOTE — Discharge Summary (Signed)
Patient ID: [name] MRN: [id number] DOB/AGE: 76/21/1933 76 y.o.  Admit date: 04/10/2011 Discharge date: 04/11/2011  Primary Discharge Diagnosis: Atrial flutter.  Secondary Discharge Diagnosis: Chronic kidney disease, hypertension, urinary tract infection on this admission treated with Cipro, macular degeneration, anxiety  Significant Diagnostic Studies: Chest x-ray with cardiomegaly, otherwise unremarkable.  Echocardiogram from office demonstrates normal EF, dilated left atrium, moderate mitral regurgitation.  Hospital Course: She was admitted after seeing [name], M.D. her primary care physician in the office setting. 2 days prior to admission she was in atrial flutter heart rate 144 and diltiazem 120 mg was initiated. This was in addition to the Toprol-XL 50 mg she was taking. She came back to see Dr. Laurann [name] 2 days later and she was still in atrial flutter at 144. She describes the palpitations as a fluttering feeling in her chest, anxious. No chest pain, no significant shortness of breath.  Shortly after admitting her, I initiated diltiazem drip with a 15 mg IV bolus, 5 mg per hour and she converted back to sinus rhythm/sinus bradycardia and is currently with sinus rhythm heart rate 60 through 56.  She feels better this morning, does not feel the fluttery/anxiety feeling. She had multiple questions concerning her medications. The only real change to her medications are increasing the frequency of diltiazem 120 mg 2 twice a day. I have also initiated Coumadin after long discussion of risk versus benefit, increased stroke risk.  Her creatinine was 1.56 on morning of discharge, 1.68 on admission. Her potassium was 3.5 then 3.1, supplemented. Her BNP was 2683. Her white count on admission was 10.7 decreased to 9.6 on discharge. Hemoglobin 13.7. TSH was normal at 1.6. Her urinalysis demonstrated large leukocytes negative nitrite.  Telemetry was reviewed and revealed sinus  rhythm/sinus bradycardia.   Discharge Exam: Blood pressure 121/70, pulse 58, temperature 97.2 F (36.2 C), temperature source Oral, resp. rate 16, height 5\' 3"  (1.6 m), weight 87.408 kg (192 lb 11.2 oz), SpO2 96.00%.   General she is alert and oriented x3 resting comfortably in bed in no acute distress. Cardiovascular: Regular rate and rhythm with soft apical murmur systolic Lungs are clear to auscultation bilaterally Prior neck scar noted No JVD Adiposity bilateral lower extremities, I do not appreciate a significant amount of pitting edema. Abdomen is soft with positive bowel sounds.  Labs:   Lab Results  Component Value Date   WBC 9.6 04/11/2011   HGB 13.7 04/11/2011   HCT 41.8 04/11/2011   MCV 94.8 04/11/2011   PLT 194 04/11/2011    Lab 04/11/11 0228 04/10/11 1426  NA 136 --  K 3.1* --  CL 99 --  CO2 22 --  BUN 17 --  CREATININE 1.56* --  CALCIUM 9.6 --  PROT -- 7.8  BILITOT -- 0.6  ALKPHOS -- 117  ALT -- 21  AST -- 21  GLUCOSE 101* --    FOLLOW UP PLANS AND APPOINTMENTS Discharge Orders    Future Orders Please Complete By Expires   Diet - low sodium heart healthy      Increase activity slowly        Medication List  As of 04/11/2011  8:18 AM   TAKE these medications         aspirin 81 MG EC tablet   Take 1 tablet (81 mg total) by mouth daily.      bumetanide 2 MG tablet   Commonly known as: BUMEX   Take 2 mg by mouth at bedtime.  ciprofloxacin 250 MG tablet   Commonly known as: CIPRO   Take 250 mg by mouth 2 (two) times daily as needed. For symptoms      ciprofloxacin 250 MG tablet   Commonly known as: CIPRO   Take 1 tablet (250 mg total) by mouth 2 (two) times daily.      diltiazem 120 MG 24 hr capsule   Commonly known as: DILACOR XR   Take 1 capsule (120 mg total) by mouth 2 (two) times daily.      ferrous sulfate 325 (65 FE) MG tablet   Take 325 mg by mouth daily with breakfast.      ICAPS Tabs   Take 1 tablet by mouth 2 (two) times  daily.      levothyroxine 75 MCG tablet   Commonly known as: SYNTHROID, LEVOTHROID   Take 75 mcg by mouth daily.      lisinopril 20 MG tablet   Commonly known as: PRINIVIL,ZESTRIL   Take 20 mg by mouth daily.      metoprolol succinate 50 MG 24 hr tablet   Commonly known as: TOPROL-XL   Take 50 mg by mouth at bedtime. Take with or immediately following a meal.      potassium chloride SA 20 MEQ tablet   Commonly known as: K-DUR,KLOR-CON   Take 40 mEq by mouth daily.      Vitamin D 2000 UNITS tablet   Take 2,000 Units by mouth daily.      warfarin 3 MG tablet   Commonly known as: COUMADIN   Take 1 tablet (3 mg total) by mouth one time only at 6 PM.           Follow-up Information    Follow up with FERGUSON,CYNTHIA A, NP on 04/17/2011. (1045am, and [employer], Pharm D - Monday 18th 300pm for coumadin)    Contact information:   [facility] [address] [place] [phone]          BRING ALL MEDICATIONS WITH YOU TO FOLLOW UP APPOINTMENTS  Time spent with patient to include physician time: 38 minutes spent with patient, instructions, meds reconciliation, review of lab work, review of chart.  SignedCandee Furbish 04/11/2011, 8:18 AM

## 2011-04-11 NOTE — Progress Notes (Signed)
UR Completed. Simmons, Lawan Nanez F [phone]  

## 2011-04-11 NOTE — Progress Notes (Signed)
[place] for Heparin/Warfarin Indication: atrial fibrillation  Allergies  Allergen Reactions  . Adhesive (Tape)     Rash   . Aspirin     Stomach upset  . Barbiturates     Poorly tolerated  . Eggs Or Egg-Derived Products     diarrhea  . Epinephrine     Fever   . Iodinated Diagnostic Agents     Kidney faiilure  . Morphine And Related     Rash   . Oxycontin     Heart race  . Penicillins     Fever   . Plendil     headache  . Pravachol     Muscle ache  . Soy Allergy     shaking  . Sulfa Antibiotics     rash  . Wheat     Stomach upset    Patient Measurements: Height: 5\' 3"  (160 cm) Weight: 192 lb 11.2 oz (87.408 kg) IBW/kg (Calculated) : 52.4  Heparin Dosing Weight: 72 kg  Vital Signs: Temp: 97.8 F (36.6 C) (02/13 2245) Temp src: Oral (02/13 2245) BP: 125/82 mmHg (02/13 2245) Pulse Rate: 71  (02/13 2245)  Labs:  Basename 04/11/11 0228 04/10/11 1426  HGB 13.7 15.4*  HCT 41.8 45.0  PLT 194 236  APTT -- --  LABPROT 13.9 12.8  INR 1.05 0.94  HEPARINUNFRC 0.22* --  CREATININE -- 1.68*  CKTOTAL -- --  CKMB -- --  TROPONINI -- --   Estimated Creatinine Clearance: 29.4 ml/min (by C-G formula based on Cr of 1.68).  Assessment: [name]   Goal of Therapy:  INR 2-3;  Heparin level 0.3-0.7   Plan:  Heparin 1500 units IV bolus, then increase heparin 1200 units/hr Check heparin level in 8 hours. Coumadin 3 mg today.  [name] 04/11/2011,3:25 AM

## 2011-04-12 ENCOUNTER — Ambulatory Visit: Payer: Medicare Other

## 2011-04-15 DIAGNOSIS — I4892 Unspecified atrial flutter: Secondary | ICD-10-CM | POA: Diagnosis not present

## 2011-04-15 DIAGNOSIS — Z7901 Long term (current) use of anticoagulants: Secondary | ICD-10-CM | POA: Diagnosis not present

## 2011-04-17 DIAGNOSIS — I10 Essential (primary) hypertension: Secondary | ICD-10-CM | POA: Diagnosis not present

## 2011-04-17 DIAGNOSIS — Z7901 Long term (current) use of anticoagulants: Secondary | ICD-10-CM | POA: Diagnosis not present

## 2011-04-17 DIAGNOSIS — I4892 Unspecified atrial flutter: Secondary | ICD-10-CM | POA: Diagnosis not present

## 2011-04-25 DIAGNOSIS — I4892 Unspecified atrial flutter: Secondary | ICD-10-CM | POA: Diagnosis not present

## 2011-04-25 DIAGNOSIS — Z7901 Long term (current) use of anticoagulants: Secondary | ICD-10-CM | POA: Diagnosis not present

## 2011-05-02 DIAGNOSIS — I4892 Unspecified atrial flutter: Secondary | ICD-10-CM | POA: Diagnosis not present

## 2011-05-02 DIAGNOSIS — Z7901 Long term (current) use of anticoagulants: Secondary | ICD-10-CM | POA: Diagnosis not present

## 2011-05-09 DIAGNOSIS — H353290 Exudative age-related macular degeneration, unspecified eye, stage unspecified: Secondary | ICD-10-CM | POA: Diagnosis not present

## 2011-05-09 DIAGNOSIS — H31019 Macula scars of posterior pole (postinflammatory) (post-traumatic), unspecified eye: Secondary | ICD-10-CM | POA: Diagnosis not present

## 2011-05-09 DIAGNOSIS — Z961 Presence of intraocular lens: Secondary | ICD-10-CM | POA: Diagnosis not present

## 2011-05-09 DIAGNOSIS — Z7901 Long term (current) use of anticoagulants: Secondary | ICD-10-CM | POA: Diagnosis not present

## 2011-05-09 DIAGNOSIS — I4892 Unspecified atrial flutter: Secondary | ICD-10-CM | POA: Diagnosis not present

## 2011-05-14 ENCOUNTER — Ambulatory Visit
Admission: RE | Admit: 2011-05-14 | Discharge: 2011-05-14 | Disposition: A | Discharge status: Home / Self Care | Payer: Medicare Other | Source: Ambulatory Visit | Attending: Internal Medicine | Admitting: Internal Medicine

## 2011-05-14 DIAGNOSIS — Z1231 Encounter for screening mammogram for malignant neoplasm of breast: Secondary | ICD-10-CM

## 2011-05-22 DIAGNOSIS — I4892 Unspecified atrial flutter: Secondary | ICD-10-CM | POA: Diagnosis not present

## 2011-05-22 DIAGNOSIS — Z7901 Long term (current) use of anticoagulants: Secondary | ICD-10-CM | POA: Diagnosis not present

## 2011-06-12 DIAGNOSIS — I10 Essential (primary) hypertension: Secondary | ICD-10-CM | POA: Diagnosis not present

## 2011-06-12 DIAGNOSIS — I4892 Unspecified atrial flutter: Secondary | ICD-10-CM | POA: Diagnosis not present

## 2011-06-12 DIAGNOSIS — Z7901 Long term (current) use of anticoagulants: Secondary | ICD-10-CM | POA: Diagnosis not present

## 2011-06-19 DIAGNOSIS — Z7901 Long term (current) use of anticoagulants: Secondary | ICD-10-CM | POA: Diagnosis not present

## 2011-06-19 DIAGNOSIS — I10 Essential (primary) hypertension: Secondary | ICD-10-CM | POA: Diagnosis not present

## 2011-06-19 DIAGNOSIS — I4892 Unspecified atrial flutter: Secondary | ICD-10-CM | POA: Diagnosis not present

## 2011-06-27 DIAGNOSIS — I4892 Unspecified atrial flutter: Secondary | ICD-10-CM | POA: Diagnosis not present

## 2011-06-27 DIAGNOSIS — Z7901 Long term (current) use of anticoagulants: Secondary | ICD-10-CM | POA: Diagnosis not present

## 2011-07-10 DIAGNOSIS — I4892 Unspecified atrial flutter: Secondary | ICD-10-CM | POA: Diagnosis not present

## 2011-07-10 DIAGNOSIS — Z7901 Long term (current) use of anticoagulants: Secondary | ICD-10-CM | POA: Diagnosis not present

## 2011-07-18 DIAGNOSIS — H31019 Macula scars of posterior pole (postinflammatory) (post-traumatic), unspecified eye: Secondary | ICD-10-CM | POA: Diagnosis not present

## 2011-07-18 DIAGNOSIS — H353290 Exudative age-related macular degeneration, unspecified eye, stage unspecified: Secondary | ICD-10-CM | POA: Diagnosis not present

## 2011-07-18 DIAGNOSIS — Z961 Presence of intraocular lens: Secondary | ICD-10-CM | POA: Diagnosis not present

## 2011-08-07 DIAGNOSIS — I10 Essential (primary) hypertension: Secondary | ICD-10-CM | POA: Diagnosis not present

## 2011-08-07 DIAGNOSIS — I4891 Unspecified atrial fibrillation: Secondary | ICD-10-CM | POA: Diagnosis not present

## 2011-08-07 DIAGNOSIS — I4892 Unspecified atrial flutter: Secondary | ICD-10-CM | POA: Diagnosis not present

## 2011-08-07 DIAGNOSIS — N183 Chronic kidney disease, stage 3 unspecified: Secondary | ICD-10-CM | POA: Diagnosis not present

## 2011-08-07 DIAGNOSIS — Z7901 Long term (current) use of anticoagulants: Secondary | ICD-10-CM | POA: Diagnosis not present

## 2011-08-14 ENCOUNTER — Ambulatory Visit: Payer: Medicare Other | Attending: Ophthalmology | Admitting: Occupational Therapy

## 2011-08-14 DIAGNOSIS — IMO0001 Reserved for inherently not codable concepts without codable children: Secondary | ICD-10-CM | POA: Insufficient documentation

## 2011-08-14 DIAGNOSIS — H542X22 Low vision right eye category 2, low vision left eye category 2: Secondary | ICD-10-CM | POA: Diagnosis not present

## 2011-08-14 DIAGNOSIS — H53419 Scotoma involving central area, unspecified eye: Secondary | ICD-10-CM | POA: Insufficient documentation

## 2011-08-14 DIAGNOSIS — H3530 Unspecified macular degeneration: Secondary | ICD-10-CM | POA: Insufficient documentation

## 2011-09-04 DIAGNOSIS — Z7901 Long term (current) use of anticoagulants: Secondary | ICD-10-CM | POA: Diagnosis not present

## 2011-09-04 DIAGNOSIS — I4892 Unspecified atrial flutter: Secondary | ICD-10-CM | POA: Diagnosis not present

## 2011-09-26 DIAGNOSIS — H31019 Macula scars of posterior pole (postinflammatory) (post-traumatic), unspecified eye: Secondary | ICD-10-CM | POA: Diagnosis not present

## 2011-09-26 DIAGNOSIS — Z961 Presence of intraocular lens: Secondary | ICD-10-CM | POA: Diagnosis not present

## 2011-09-26 DIAGNOSIS — H353290 Exudative age-related macular degeneration, unspecified eye, stage unspecified: Secondary | ICD-10-CM | POA: Diagnosis not present

## 2011-10-10 ENCOUNTER — Other Ambulatory Visit: Payer: Self-pay

## 2011-10-10 DIAGNOSIS — D485 Neoplasm of uncertain behavior of skin: Secondary | ICD-10-CM | POA: Diagnosis not present

## 2011-10-10 DIAGNOSIS — E785 Hyperlipidemia, unspecified: Secondary | ICD-10-CM | POA: Diagnosis not present

## 2011-10-10 DIAGNOSIS — Z7901 Long term (current) use of anticoagulants: Secondary | ICD-10-CM | POA: Diagnosis not present

## 2011-10-10 DIAGNOSIS — I10 Essential (primary) hypertension: Secondary | ICD-10-CM | POA: Diagnosis not present

## 2011-10-10 DIAGNOSIS — I4892 Unspecified atrial flutter: Secondary | ICD-10-CM | POA: Diagnosis not present

## 2011-10-10 DIAGNOSIS — G4710 Hypersomnia, unspecified: Secondary | ICD-10-CM | POA: Diagnosis not present

## 2011-10-10 DIAGNOSIS — I4891 Unspecified atrial fibrillation: Secondary | ICD-10-CM | POA: Diagnosis not present

## 2011-10-14 DIAGNOSIS — I4891 Unspecified atrial fibrillation: Secondary | ICD-10-CM | POA: Diagnosis not present

## 2011-11-07 DIAGNOSIS — Z7901 Long term (current) use of anticoagulants: Secondary | ICD-10-CM | POA: Diagnosis not present

## 2011-11-07 DIAGNOSIS — I4892 Unspecified atrial flutter: Secondary | ICD-10-CM | POA: Diagnosis not present

## 2011-11-21 DIAGNOSIS — Z23 Encounter for immunization: Secondary | ICD-10-CM | POA: Diagnosis not present

## 2011-12-05 DIAGNOSIS — Z7901 Long term (current) use of anticoagulants: Secondary | ICD-10-CM | POA: Diagnosis not present

## 2011-12-05 DIAGNOSIS — I4891 Unspecified atrial fibrillation: Secondary | ICD-10-CM | POA: Diagnosis not present

## 2012-01-02 DIAGNOSIS — H31019 Macula scars of posterior pole (postinflammatory) (post-traumatic), unspecified eye: Secondary | ICD-10-CM | POA: Diagnosis not present

## 2012-01-02 DIAGNOSIS — H353290 Exudative age-related macular degeneration, unspecified eye, stage unspecified: Secondary | ICD-10-CM | POA: Diagnosis not present

## 2012-01-02 DIAGNOSIS — I4892 Unspecified atrial flutter: Secondary | ICD-10-CM | POA: Diagnosis not present

## 2012-01-02 DIAGNOSIS — Z961 Presence of intraocular lens: Secondary | ICD-10-CM | POA: Diagnosis not present

## 2012-01-02 DIAGNOSIS — Z7901 Long term (current) use of anticoagulants: Secondary | ICD-10-CM | POA: Diagnosis not present

## 2012-01-09 DIAGNOSIS — L905 Scar conditions and fibrosis of skin: Secondary | ICD-10-CM | POA: Diagnosis not present

## 2012-01-09 DIAGNOSIS — D1801 Hemangioma of skin and subcutaneous tissue: Secondary | ICD-10-CM | POA: Diagnosis not present

## 2012-01-26 HISTORY — PX: TOTAL ABDOMINAL HYSTERECTOMY W/ BILATERAL SALPINGOOPHORECTOMY: SHX83

## 2012-01-29 DIAGNOSIS — Z7901 Long term (current) use of anticoagulants: Secondary | ICD-10-CM | POA: Diagnosis not present

## 2012-01-29 DIAGNOSIS — I4891 Unspecified atrial fibrillation: Secondary | ICD-10-CM | POA: Diagnosis not present

## 2012-02-20 DIAGNOSIS — N184 Chronic kidney disease, stage 4 (severe): Secondary | ICD-10-CM | POA: Diagnosis not present

## 2012-02-20 DIAGNOSIS — E785 Hyperlipidemia, unspecified: Secondary | ICD-10-CM | POA: Diagnosis not present

## 2012-02-20 DIAGNOSIS — I10 Essential (primary) hypertension: Secondary | ICD-10-CM | POA: Diagnosis not present

## 2012-02-20 DIAGNOSIS — Z1331 Encounter for screening for depression: Secondary | ICD-10-CM | POA: Diagnosis not present

## 2012-02-20 DIAGNOSIS — E559 Vitamin D deficiency, unspecified: Secondary | ICD-10-CM | POA: Diagnosis not present

## 2012-02-20 DIAGNOSIS — Z0000 Encounter for general adult medical examination without abnormal findings: Secondary | ICD-10-CM | POA: Diagnosis not present

## 2012-02-20 DIAGNOSIS — R7301 Impaired fasting glucose: Secondary | ICD-10-CM | POA: Diagnosis not present

## 2012-02-20 DIAGNOSIS — E039 Hypothyroidism, unspecified: Secondary | ICD-10-CM | POA: Diagnosis not present

## 2012-02-20 DIAGNOSIS — R197 Diarrhea, unspecified: Secondary | ICD-10-CM | POA: Diagnosis not present

## 2012-02-27 DIAGNOSIS — Z7901 Long term (current) use of anticoagulants: Secondary | ICD-10-CM | POA: Diagnosis not present

## 2012-02-27 DIAGNOSIS — I4891 Unspecified atrial fibrillation: Secondary | ICD-10-CM | POA: Diagnosis not present

## 2012-03-02 DIAGNOSIS — J310 Chronic rhinitis: Secondary | ICD-10-CM | POA: Diagnosis not present

## 2012-03-02 DIAGNOSIS — T781XXA Other adverse food reactions, not elsewhere classified, initial encounter: Secondary | ICD-10-CM | POA: Diagnosis not present

## 2012-03-31 DIAGNOSIS — I4892 Unspecified atrial flutter: Secondary | ICD-10-CM | POA: Diagnosis not present

## 2012-03-31 DIAGNOSIS — Z7901 Long term (current) use of anticoagulants: Secondary | ICD-10-CM | POA: Diagnosis not present

## 2012-05-04 DIAGNOSIS — Z7901 Long term (current) use of anticoagulants: Secondary | ICD-10-CM | POA: Diagnosis not present

## 2012-05-04 DIAGNOSIS — I4892 Unspecified atrial flutter: Secondary | ICD-10-CM | POA: Diagnosis not present

## 2012-05-21 DIAGNOSIS — I4892 Unspecified atrial flutter: Secondary | ICD-10-CM | POA: Diagnosis not present

## 2012-05-21 DIAGNOSIS — I10 Essential (primary) hypertension: Secondary | ICD-10-CM | POA: Diagnosis not present

## 2012-05-21 DIAGNOSIS — E785 Hyperlipidemia, unspecified: Secondary | ICD-10-CM | POA: Diagnosis not present

## 2012-05-21 DIAGNOSIS — Z7901 Long term (current) use of anticoagulants: Secondary | ICD-10-CM | POA: Diagnosis not present

## 2012-06-01 ENCOUNTER — Other Ambulatory Visit: Payer: Self-pay

## 2012-06-01 DIAGNOSIS — Z1231 Encounter for screening mammogram for malignant neoplasm of breast: Secondary | ICD-10-CM

## 2012-06-01 DIAGNOSIS — I4892 Unspecified atrial flutter: Secondary | ICD-10-CM | POA: Diagnosis not present

## 2012-06-01 DIAGNOSIS — Z7901 Long term (current) use of anticoagulants: Secondary | ICD-10-CM | POA: Diagnosis not present

## 2012-06-16 DIAGNOSIS — I4892 Unspecified atrial flutter: Secondary | ICD-10-CM | POA: Diagnosis not present

## 2012-06-16 DIAGNOSIS — Z7901 Long term (current) use of anticoagulants: Secondary | ICD-10-CM | POA: Diagnosis not present

## 2012-06-18 DIAGNOSIS — H31019 Macula scars of posterior pole (postinflammatory) (post-traumatic), unspecified eye: Secondary | ICD-10-CM | POA: Diagnosis not present

## 2012-06-18 DIAGNOSIS — Z961 Presence of intraocular lens: Secondary | ICD-10-CM | POA: Diagnosis not present

## 2012-06-18 DIAGNOSIS — H353290 Exudative age-related macular degeneration, unspecified eye, stage unspecified: Secondary | ICD-10-CM | POA: Diagnosis not present

## 2012-06-24 DIAGNOSIS — L570 Actinic keratosis: Secondary | ICD-10-CM | POA: Diagnosis not present

## 2012-06-24 DIAGNOSIS — I509 Heart failure, unspecified: Secondary | ICD-10-CM | POA: Diagnosis not present

## 2012-06-24 DIAGNOSIS — D485 Neoplasm of uncertain behavior of skin: Secondary | ICD-10-CM | POA: Diagnosis not present

## 2012-06-29 ENCOUNTER — Ambulatory Visit
Admission: RE | Admit: 2012-06-29 | Discharge: 2012-06-29 | Disposition: A | Discharge status: Home / Self Care | Payer: Medicare Other | Source: Ambulatory Visit

## 2012-06-29 DIAGNOSIS — Z1231 Encounter for screening mammogram for malignant neoplasm of breast: Secondary | ICD-10-CM

## 2012-07-15 DIAGNOSIS — Z7901 Long term (current) use of anticoagulants: Secondary | ICD-10-CM | POA: Diagnosis not present

## 2012-07-15 DIAGNOSIS — I4892 Unspecified atrial flutter: Secondary | ICD-10-CM | POA: Diagnosis not present

## 2012-08-13 DIAGNOSIS — I4892 Unspecified atrial flutter: Secondary | ICD-10-CM | POA: Diagnosis not present

## 2012-08-13 DIAGNOSIS — Z7901 Long term (current) use of anticoagulants: Secondary | ICD-10-CM | POA: Diagnosis not present

## 2012-09-03 DIAGNOSIS — I10 Essential (primary) hypertension: Secondary | ICD-10-CM | POA: Diagnosis not present

## 2012-09-03 DIAGNOSIS — N958 Other specified menopausal and perimenopausal disorders: Secondary | ICD-10-CM | POA: Diagnosis not present

## 2012-09-03 DIAGNOSIS — N183 Chronic kidney disease, stage 3 unspecified: Secondary | ICD-10-CM | POA: Diagnosis not present

## 2012-09-03 DIAGNOSIS — M810 Age-related osteoporosis without current pathological fracture: Secondary | ICD-10-CM | POA: Diagnosis not present

## 2012-09-10 DIAGNOSIS — I4891 Unspecified atrial fibrillation: Secondary | ICD-10-CM | POA: Diagnosis not present

## 2012-09-10 DIAGNOSIS — Z7901 Long term (current) use of anticoagulants: Secondary | ICD-10-CM | POA: Diagnosis not present

## 2012-10-08 DIAGNOSIS — I4892 Unspecified atrial flutter: Secondary | ICD-10-CM | POA: Diagnosis not present

## 2012-10-08 DIAGNOSIS — Z7901 Long term (current) use of anticoagulants: Secondary | ICD-10-CM | POA: Diagnosis not present

## 2012-11-05 DIAGNOSIS — Z7901 Long term (current) use of anticoagulants: Secondary | ICD-10-CM | POA: Diagnosis not present

## 2012-11-05 DIAGNOSIS — I4892 Unspecified atrial flutter: Secondary | ICD-10-CM | POA: Diagnosis not present

## 2012-11-15 ENCOUNTER — Encounter: Payer: Self-pay | Admitting: Cardiology

## 2012-11-18 DIAGNOSIS — Z23 Encounter for immunization: Secondary | ICD-10-CM | POA: Diagnosis not present

## 2012-11-26 DIAGNOSIS — Z961 Presence of intraocular lens: Secondary | ICD-10-CM | POA: Diagnosis not present

## 2012-11-26 DIAGNOSIS — H353290 Exudative age-related macular degeneration, unspecified eye, stage unspecified: Secondary | ICD-10-CM | POA: Diagnosis not present

## 2012-11-26 DIAGNOSIS — H31019 Macula scars of posterior pole (postinflammatory) (post-traumatic), unspecified eye: Secondary | ICD-10-CM | POA: Diagnosis not present

## 2012-11-27 ENCOUNTER — Ambulatory Visit (INDEPENDENT_AMBULATORY_CARE_PROVIDER_SITE_OTHER): Payer: Medicare Other | Admitting: Cardiology

## 2012-11-27 ENCOUNTER — Encounter: Payer: Self-pay | Admitting: Cardiology

## 2012-11-27 ENCOUNTER — Ambulatory Visit (INDEPENDENT_AMBULATORY_CARE_PROVIDER_SITE_OTHER): Payer: Medicare Other | Admitting: Pharmacist

## 2012-11-27 VITALS — BP 122/82 | HR 83 | Ht 63.5 in | Wt 180.4 lb

## 2012-11-27 DIAGNOSIS — N184 Chronic kidney disease, stage 4 (severe): Secondary | ICD-10-CM | POA: Diagnosis not present

## 2012-11-27 DIAGNOSIS — I059 Rheumatic mitral valve disease, unspecified: Secondary | ICD-10-CM

## 2012-11-27 DIAGNOSIS — I4892 Unspecified atrial flutter: Secondary | ICD-10-CM

## 2012-11-27 DIAGNOSIS — Z7901 Long term (current) use of anticoagulants: Secondary | ICD-10-CM | POA: Insufficient documentation

## 2012-11-27 DIAGNOSIS — I340 Nonrheumatic mitral (valve) insufficiency: Secondary | ICD-10-CM

## 2012-11-27 HISTORY — DX: Nonrheumatic mitral (valve) insufficiency: I34.0

## 2012-11-27 LAB — POCT INR: INR: 2.1

## 2012-11-27 NOTE — Progress Notes (Signed)
Patient ID: [name], female   DOB: April 26, 1931, 79 y.o.   MRN: [id number]       [address], [address], [place]  [address] Phone: [phone] Fax:  [phone]  Date:  11/27/2012   ID:  [name], DOB [birth year], MRN [id number]  PCP:  No primary provider on file.   History of Present Illness: [name] is a 79 y.o. female with history of atrial flutter, chronic kidney disease here for followup.   She has been admitted in the past with atrial flutter heart rate of 145 beats per minute with rapid ventricular response. Her symptoms were sensation of palpitations, shakiness. No prior cardioversions took place. She has been rate controlled.  At previous visits, I have titrated her metoprolol. Diltiazem also. She takes Bumex as well. Shortness of breath felt to be in part secondary to diastolic heart failure.  10/04/11- Holter - persistent atrial fibrillation/flutter average heart rate 94 beats per minute. 8/13-Echocardiogram-normal ejection fraction, dilated left atrium, moderate mitral regurgitation  Wt Readings from Last 3 Encounters:  04/10/11 192 lb 11.2 oz (87.408 kg)  04/10/11 192 lb 11.2 oz (87.408 kg)     Past Medical History  Diagnosis Date  . Hypertension   . Renal disorder   . Cancer   . Complication of anesthesia     sensitive to drugs  . Shortness of breath   . Hypothyroidism   . Blood transfusion     " no reaction to transfusion"  . Headache(784.0)   . Dysrhythmia     atrial flutter  . Leiomyosarcoma of uterus     Chemotherapy, Magrinat and Clarke-Pearson  . Visual loss, right eye     Left ocular accident  . UTI (urinary tract infection)     Frequent  . Hyperlipidemia   . Migraine headache     Improved after menopause  . Shortness of breath     Cardiology Eval., possible diastolic dysfunctio  . Impaired fasting glucose   . Kidney disease, chronic, stage IV (GFR 15-29 ml/min)   . Vitamin D deficiency   . Wrist fracture,  right 12/2009  . Macular degeneration   . Atrial fibrillation     Past Surgical History  Procedure Laterality Date  . Tonsillectomy    . Total abdominal hysterectomy w/ bilateral salpingoophorectomy  01/2012    , Partial resection of the sigmoid colon  . Nerve biopsy    . Blepharoplasty    . Cataract surgery Bilateral     Current Outpatient Prescriptions  Medication Sig Dispense Refill  . bumetanide (BUMEX) 2 MG tablet Take 2 mg by mouth at bedtime.      . Cholecalciferol (VITAMIN D) 2000 UNITS tablet Take 2,000 Units by mouth daily.      . ciprofloxacin (CIPRO) 250 MG tablet Take 250 mg by mouth 2 (two) times daily as needed. For symptoms      . diltiazem (DILACOR XR) 120 MG 24 hr capsule Take 1 capsule (120 mg total) by mouth 2 (two) times daily.  60 capsule  12  . ferrous sulfate 325 (65 FE) MG tablet Take 325 mg by mouth daily with breakfast.      . levothyroxine (SYNTHROID, LEVOTHROID) 75 MCG tablet Take 75 mcg by mouth daily.      Marland Kitchen lisinopril (PRINIVIL,ZESTRIL) 20 MG tablet Take 20 mg by mouth daily.      . metoprolol succinate (TOPROL-XL) 50 MG 24 hr tablet Take 50 mg by mouth  at bedtime. Take with or immediately following a meal.      . Multiple Vitamins-Minerals (ICAPS) TABS Take 1 tablet by mouth 2 (two) times daily.      . potassium chloride SA (K-DUR,KLOR-CON) 20 MEQ tablet Take 40 mEq by mouth daily.      Marland Kitchen warfarin (COUMADIN) 3 MG tablet Take 1 tablet (3 mg total) by mouth one time only at 6 PM.  30 tablet  1   No current facility-administered medications for this visit.    Allergies:    Allergies  Allergen Reactions  . Plendil [Felodipine]   . Adhesive [Tape]     Rash   . Aspirin     Stomach upset  . Barbiturates     Poorly tolerated  . Eggs Or Egg-Derived Products     diarrhea  . Epinephrine     Fever   . Felodipine Er     headache  . Iodinated Diagnostic Agents     Kidney faiilure  . Morphine And Related     Rash   . Oxycodone Hcl     Heart  race  . Penicillins     Fever   . Pravachol     Muscle ache  . Soy Allergy     shaking  . Sulfa Antibiotics     rash  . Wheat     Stomach upset    Social History:  The patient  reports that she has never smoked. She has never used smokeless tobacco. She reports that she does not drink alcohol or use illicit drugs.   ROS:  Please see the history of present illness.   No bleeding, no syncope, no strokelike symptoms, no chest pain.   All other systems reviewed and negative.   PHYSICAL EXAM: VS:  There were no vitals taken for this visit. Well nourished, well developed, in no acute distress HEENT: normal Neck: no JVD Cardiac:  Mildly irreg. S1, S2; RRR; 1/6 Systolic murmur apex Lungs:  clear to auscultation bilaterally, no wheezing, rhonchi or rales Abd: soft, nontender, no hepatomegaly Ext: no edema Skin: warm and dry Neuro: no focal abnormalities noted  BMET    Component Value Date/Time   NA 136 04/11/2011 0228   K 3.1* 04/11/2011 0228   CL 99 04/11/2011 0228   CO2 22 04/11/2011 0228   GLUCOSE 101* 04/11/2011 0228   BUN 17 04/11/2011 0228   CREATININE 1.56* 04/11/2011 0228   CALCIUM 9.6 04/11/2011 0228   GFRNONAA 31* 04/11/2011 0228   GFRAA 36* 04/11/2011 0228   CBC    Component Value Date/Time   WBC 9.6 04/11/2011 0228   WBC 6.0 01/18/2008 0851   RBC 4.41 04/11/2011 0228   RBC 4.26 01/18/2008 0851   HGB 13.7 04/11/2011 0228   HGB 13.5 01/18/2008 0851   HCT 41.8 04/11/2011 0228   HCT 39.4 01/18/2008 0851   PLT 194 04/11/2011 0228   PLT 175 01/18/2008 0851   MCV 94.8 04/11/2011 0228   MCV 92.5 01/18/2008 0851   MCH 31.1 04/11/2011 0228   MCH 31.7 01/18/2008 0851   MCHC 32.8 04/11/2011 0228   MCHC 34.3 01/18/2008 0851   RDW 14.1 04/11/2011 0228   RDW 14.1 01/18/2008 0851   LYMPHSABS 1.6 04/10/2011 1426   LYMPHSABS 1.1 01/18/2008 0851   MONOABS 0.7 04/10/2011 1426   MONOABS 0.4 01/18/2008 0851   EOSABS 0.2 04/10/2011 1426   EOSABS 0.2 01/18/2008 0851   BASOSABS 0.0  04/10/2011 1426   BASOSABS 0.0 01/18/2008  [id number]    EKG:  Atrial fibrillation, 83, no changes from prior     ASSESSMENT AND PLAN:  79 year old with persistent atrial fibrillation/flutter currently rate controlled with chronic anticoagulation, chronic kidney disease stage IV.   1. Atrial fibrillation/flutter-continue with good rate control. Medications reviewed. 2. Chronic kidney disease stage IV-avoid NSAIDs. Currently on ACE inhibitor. 3. Chronic anticoagulation-continue to monitor Coumadin closely. No bleeding. 4. Mild LVH/moderate mitral regurgitation-no change in symptoms. Continue to monitor.  Signed, Candee Furbish, MD [facility]  11/27/2012 8:36 AM

## 2012-11-27 NOTE — Patient Instructions (Signed)
Your physician recommends that you continue on your current medications as directed. Please refer to the Current Medication list given to you today.  Your physician wants you to follow-up in: 6 months with Dr. Skains. You will receive a reminder letter in the mail two months in advance. If you don't receive a letter, please call our office to schedule the follow-up appointment.  

## 2012-12-16 DIAGNOSIS — L905 Scar conditions and fibrosis of skin: Secondary | ICD-10-CM | POA: Diagnosis not present

## 2012-12-16 DIAGNOSIS — D485 Neoplasm of uncertain behavior of skin: Secondary | ICD-10-CM | POA: Diagnosis not present

## 2012-12-23 ENCOUNTER — Other Ambulatory Visit: Payer: Self-pay | Admitting: Cardiology

## 2012-12-24 ENCOUNTER — Ambulatory Visit (INDEPENDENT_AMBULATORY_CARE_PROVIDER_SITE_OTHER): Payer: Medicare Other | Admitting: Pharmacist

## 2012-12-24 ENCOUNTER — Other Ambulatory Visit: Payer: Self-pay | Admitting: Cardiology

## 2012-12-24 DIAGNOSIS — I4892 Unspecified atrial flutter: Secondary | ICD-10-CM

## 2013-01-11 ENCOUNTER — Ambulatory Visit (INDEPENDENT_AMBULATORY_CARE_PROVIDER_SITE_OTHER): Payer: Medicare Other | Admitting: *Deleted

## 2013-01-11 DIAGNOSIS — I4892 Unspecified atrial flutter: Secondary | ICD-10-CM

## 2013-01-11 LAB — POCT INR: INR: 2.7

## 2013-01-19 ENCOUNTER — Other Ambulatory Visit: Payer: Self-pay | Admitting: Cardiology

## 2013-01-20 ENCOUNTER — Telehealth: Payer: Self-pay | Admitting: Cardiology

## 2013-01-20 ENCOUNTER — Other Ambulatory Visit: Payer: Self-pay | Admitting: Cardiology

## 2013-01-20 NOTE — Telephone Encounter (Signed)
Updated metoptolol dosage

## 2013-01-20 NOTE — Telephone Encounter (Signed)
Per Dr. Marlou Porch pt can stay on 100mg  daily of Metoprolol.

## 2013-01-20 NOTE — Telephone Encounter (Signed)
Spoke with pt and she is taking 100 mg daily even though med list states 50 mg daily. I will check with Dr. Marlou Porch to see if he is okay with her taking 100 mg daily.

## 2013-01-20 NOTE — Telephone Encounter (Signed)
Amy, can you look at this? They requested a different mg than what is on her office visit list. Please clarify. Thanks, MI

## 2013-02-01 ENCOUNTER — Ambulatory Visit (INDEPENDENT_AMBULATORY_CARE_PROVIDER_SITE_OTHER): Payer: Medicare Other | Admitting: *Deleted

## 2013-02-01 DIAGNOSIS — I4892 Unspecified atrial flutter: Secondary | ICD-10-CM | POA: Diagnosis not present

## 2013-02-19 DIAGNOSIS — N390 Urinary tract infection, site not specified: Secondary | ICD-10-CM | POA: Diagnosis not present

## 2013-02-19 DIAGNOSIS — N183 Chronic kidney disease, stage 3 unspecified: Secondary | ICD-10-CM | POA: Diagnosis not present

## 2013-02-19 DIAGNOSIS — Z0000 Encounter for general adult medical examination without abnormal findings: Secondary | ICD-10-CM | POA: Diagnosis not present

## 2013-02-19 DIAGNOSIS — E039 Hypothyroidism, unspecified: Secondary | ICD-10-CM | POA: Diagnosis not present

## 2013-02-19 DIAGNOSIS — M810 Age-related osteoporosis without current pathological fracture: Secondary | ICD-10-CM | POA: Diagnosis not present

## 2013-02-19 DIAGNOSIS — Z1331 Encounter for screening for depression: Secondary | ICD-10-CM | POA: Diagnosis not present

## 2013-02-19 DIAGNOSIS — I10 Essential (primary) hypertension: Secondary | ICD-10-CM | POA: Diagnosis not present

## 2013-02-22 ENCOUNTER — Ambulatory Visit (INDEPENDENT_AMBULATORY_CARE_PROVIDER_SITE_OTHER): Payer: Medicare Other | Admitting: Pharmacist

## 2013-02-22 DIAGNOSIS — I4892 Unspecified atrial flutter: Secondary | ICD-10-CM | POA: Diagnosis not present

## 2013-02-22 DIAGNOSIS — I10 Essential (primary) hypertension: Secondary | ICD-10-CM | POA: Diagnosis not present

## 2013-02-22 LAB — POCT INR: INR: 2.7

## 2013-03-22 ENCOUNTER — Ambulatory Visit (INDEPENDENT_AMBULATORY_CARE_PROVIDER_SITE_OTHER): Payer: Medicare Other | Admitting: Pharmacist

## 2013-03-22 DIAGNOSIS — I4892 Unspecified atrial flutter: Secondary | ICD-10-CM

## 2013-03-22 LAB — POCT INR: INR: 2.3

## 2013-05-03 ENCOUNTER — Ambulatory Visit (INDEPENDENT_AMBULATORY_CARE_PROVIDER_SITE_OTHER): Payer: Medicare Other | Admitting: *Deleted

## 2013-05-03 DIAGNOSIS — I4892 Unspecified atrial flutter: Secondary | ICD-10-CM | POA: Diagnosis not present

## 2013-05-03 LAB — POCT INR: INR: 3

## 2013-05-27 DIAGNOSIS — H353290 Exudative age-related macular degeneration, unspecified eye, stage unspecified: Secondary | ICD-10-CM | POA: Diagnosis not present

## 2013-05-27 DIAGNOSIS — H31019 Macula scars of posterior pole (postinflammatory) (post-traumatic), unspecified eye: Secondary | ICD-10-CM | POA: Diagnosis not present

## 2013-05-27 DIAGNOSIS — Z961 Presence of intraocular lens: Secondary | ICD-10-CM | POA: Diagnosis not present

## 2013-06-14 ENCOUNTER — Ambulatory Visit (INDEPENDENT_AMBULATORY_CARE_PROVIDER_SITE_OTHER): Payer: Medicare Other | Admitting: *Deleted

## 2013-06-14 DIAGNOSIS — I4892 Unspecified atrial flutter: Secondary | ICD-10-CM | POA: Diagnosis not present

## 2013-06-14 LAB — POCT INR: INR: 2.5

## 2013-06-21 DIAGNOSIS — L820 Inflamed seborrheic keratosis: Secondary | ICD-10-CM | POA: Diagnosis not present

## 2013-06-21 DIAGNOSIS — L719 Rosacea, unspecified: Secondary | ICD-10-CM | POA: Diagnosis not present

## 2013-07-01 DIAGNOSIS — H31019 Macula scars of posterior pole (postinflammatory) (post-traumatic), unspecified eye: Secondary | ICD-10-CM | POA: Diagnosis not present

## 2013-07-01 DIAGNOSIS — Z961 Presence of intraocular lens: Secondary | ICD-10-CM | POA: Diagnosis not present

## 2013-07-01 DIAGNOSIS — H353290 Exudative age-related macular degeneration, unspecified eye, stage unspecified: Secondary | ICD-10-CM | POA: Diagnosis not present

## 2013-07-12 ENCOUNTER — Other Ambulatory Visit: Payer: Self-pay | Admitting: Cardiology

## 2013-07-13 NOTE — Telephone Encounter (Signed)
Which is the correct diltiazem ?

## 2013-07-13 NOTE — Telephone Encounter (Signed)
Kenyatta, can you clarify diltiazem dose? Thanks, MI

## 2013-07-20 ENCOUNTER — Other Ambulatory Visit: Payer: Self-pay | Admitting: Cardiology

## 2013-07-21 NOTE — Telephone Encounter (Signed)
Can you verify dose on this? Thanks, MI

## 2013-07-22 ENCOUNTER — Other Ambulatory Visit: Payer: Self-pay

## 2013-07-22 MED ORDER — DILTIAZEM HCL ER 120 MG PO CP24: 120.0000 mg | ORAL_CAPSULE | Freq: Every day | ORAL | Status: DC

## 2013-07-22 NOTE — Telephone Encounter (Signed)
Rose, did you speak to Dr. Marlou Porch about this refill? If not, let me know and I will check with him.

## 2013-07-22 NOTE — Telephone Encounter (Signed)
Yes I did

## 2013-07-23 ENCOUNTER — Telehealth: Payer: Self-pay | Admitting: Cardiology

## 2013-07-23 MED ORDER — DILTIAZEM HCL ER COATED BEADS 180 MG PO CP24
180.0000 mg | ORAL_CAPSULE | Freq: Every day | ORAL | Status: DC
Start: 2013-07-23 — End: 2014-08-09

## 2013-07-23 NOTE — Telephone Encounter (Signed)
New message    For Ysidro Evert Talk to [place] only regarding a mixup on her medications

## 2013-07-23 NOTE — Telephone Encounter (Signed)
Patient had both Diltiazem 120 mg bid and Diltiazem 180 mg qd on her med list, and recently prescription was sent in for 120 mg once daily.  I spoke with pharmacy, and patient had actually been on Diltiazem CD 180 mg once daily since 2013.  I cancelled the diltiazem 120 mg prescription, and called Diltiazem 180 mg CD qd, #30 with 11 RF to [facility].  Med list updated.

## 2013-07-23 NOTE — Telephone Encounter (Signed)
Done

## 2013-07-26 ENCOUNTER — Ambulatory Visit (INDEPENDENT_AMBULATORY_CARE_PROVIDER_SITE_OTHER): Payer: Medicare Other | Admitting: *Deleted

## 2013-07-26 DIAGNOSIS — I4892 Unspecified atrial flutter: Secondary | ICD-10-CM | POA: Diagnosis not present

## 2013-07-26 LAB — POCT INR: INR: 3.5

## 2013-08-06 ENCOUNTER — Other Ambulatory Visit: Payer: Self-pay

## 2013-08-06 DIAGNOSIS — Z1231 Encounter for screening mammogram for malignant neoplasm of breast: Secondary | ICD-10-CM

## 2013-08-11 ENCOUNTER — Other Ambulatory Visit: Payer: Self-pay | Admitting: Cardiology

## 2013-08-17 ENCOUNTER — Ambulatory Visit: Payer: Medicare Other | Admitting: Cardiology

## 2013-08-19 ENCOUNTER — Ambulatory Visit
Admission: RE | Admit: 2013-08-19 | Discharge: 2013-08-19 | Disposition: A | Discharge status: Home / Self Care | Payer: Medicare Other | Source: Ambulatory Visit

## 2013-08-19 ENCOUNTER — Encounter (INDEPENDENT_AMBULATORY_CARE_PROVIDER_SITE_OTHER): Payer: Self-pay

## 2013-08-19 DIAGNOSIS — Z1231 Encounter for screening mammogram for malignant neoplasm of breast: Secondary | ICD-10-CM | POA: Diagnosis not present

## 2013-08-23 DIAGNOSIS — N390 Urinary tract infection, site not specified: Secondary | ICD-10-CM | POA: Diagnosis not present

## 2013-08-23 DIAGNOSIS — D509 Iron deficiency anemia, unspecified: Secondary | ICD-10-CM | POA: Diagnosis not present

## 2013-08-23 DIAGNOSIS — M810 Age-related osteoporosis without current pathological fracture: Secondary | ICD-10-CM | POA: Diagnosis not present

## 2013-08-23 DIAGNOSIS — Z23 Encounter for immunization: Secondary | ICD-10-CM | POA: Diagnosis not present

## 2013-08-23 DIAGNOSIS — I10 Essential (primary) hypertension: Secondary | ICD-10-CM | POA: Diagnosis not present

## 2013-08-26 DIAGNOSIS — H353290 Exudative age-related macular degeneration, unspecified eye, stage unspecified: Secondary | ICD-10-CM | POA: Diagnosis not present

## 2013-08-26 DIAGNOSIS — H31019 Macula scars of posterior pole (postinflammatory) (post-traumatic), unspecified eye: Secondary | ICD-10-CM | POA: Diagnosis not present

## 2013-08-26 DIAGNOSIS — Z961 Presence of intraocular lens: Secondary | ICD-10-CM | POA: Diagnosis not present

## 2013-08-31 ENCOUNTER — Encounter: Payer: Self-pay | Admitting: Cardiology

## 2013-08-31 ENCOUNTER — Ambulatory Visit (INDEPENDENT_AMBULATORY_CARE_PROVIDER_SITE_OTHER): Payer: Medicare Other | Admitting: Pharmacist

## 2013-08-31 ENCOUNTER — Ambulatory Visit (INDEPENDENT_AMBULATORY_CARE_PROVIDER_SITE_OTHER): Payer: Medicare Other | Admitting: Cardiology

## 2013-08-31 VITALS — BP 120/82 | HR 85 | Ht 63.5 in | Wt 173.0 lb

## 2013-08-31 DIAGNOSIS — Z7901 Long term (current) use of anticoagulants: Secondary | ICD-10-CM | POA: Diagnosis not present

## 2013-08-31 DIAGNOSIS — N184 Chronic kidney disease, stage 4 (severe): Secondary | ICD-10-CM

## 2013-08-31 DIAGNOSIS — I059 Rheumatic mitral valve disease, unspecified: Secondary | ICD-10-CM | POA: Diagnosis not present

## 2013-08-31 DIAGNOSIS — I4892 Unspecified atrial flutter: Secondary | ICD-10-CM | POA: Diagnosis not present

## 2013-08-31 DIAGNOSIS — I340 Nonrheumatic mitral (valve) insufficiency: Secondary | ICD-10-CM

## 2013-08-31 DIAGNOSIS — R634 Abnormal weight loss: Secondary | ICD-10-CM

## 2013-08-31 LAB — POCT INR: INR: 2.5

## 2013-08-31 NOTE — Patient Instructions (Signed)
Continue current medications as listed.  Follow up in 6 months with Dr Marlou Porch.  You will receive a letter in the mail 2 months before you are due.  Please call us when you receive this letter to schedule your follow up appointment.

## 2013-08-31 NOTE — Progress Notes (Signed)
Patient ID: [name], female   DOB: 1931-12-16, 80 y.o.   MRN: [id number]       [address], [address] [place], [place]  [address] Phone: [phone] Fax:  [phone]  Date:  08/31/2013   ID:  [name], DOB 02/12/1932, MRN [id number]  PCP:  Irven Shelling, MD   History of Present Illness: [name] is a 80 y.o. female with history of atrial flutter, chronic kidney disease here for followup.   She has been admitted in the past with atrial flutter heart rate of 145 beats per minute with rapid ventricular response. Her symptoms were sensation of palpitations, shakiness. No prior cardioversions took place. She has been rate controlled.  At previous visits, I have titrated her metoprolol. Diltiazem also. She takes Bumex as well. Shortness of breath felt to be in part secondary to diastolic heart failure.  10/04/11- Holter - persistent atrial fibrillation/flutter average heart rate 94 beats per minute. 8/13-Echocardiogram-normal ejection fraction, dilated left atrium, moderate mitral regurgitation  Overall she's doing very well. No complaints. [place]. Had some questions regarding medicines as below.  Wt Readings from Last 3 Encounters:  08/31/13 173 lb (78.472 kg)  11/27/12 180 lb 6.4 oz (81.829 kg)  04/10/11 192 lb 11.2 oz (87.408 kg)     Past Medical History  Diagnosis Date  . Hypertension   . Renal disorder   . Cancer   . Complication of anesthesia     sensitive to drugs  . Shortness of breath   . Hypothyroidism   . Blood transfusion     " no reaction to transfusion"  . Headache(784.0)   . Dysrhythmia     atrial flutter  . Leiomyosarcoma of uterus     Chemotherapy, Magrinat and Clarke-Pearson  . Visual loss, right eye     Left ocular accident  . UTI (urinary tract infection)     Frequent  . Hyperlipidemia   . Migraine headache     Improved after menopause  . Shortness of breath       Cardiology Eval., possible diastolic dysfunctio  . Impaired fasting glucose   . Kidney disease, chronic, stage IV (GFR 15-29 ml/min)   . Vitamin D deficiency   . Wrist fracture, right 12/2009  . Macular degeneration   . Atrial fibrillation   . Mitral regurgitation 11/27/2012    Past Surgical History  Procedure Laterality Date  . Tonsillectomy    . Total abdominal hysterectomy w/ bilateral salpingoophorectomy  01/2012    , Partial resection of the sigmoid colon  . Nerve biopsy    . Blepharoplasty    . Cataract surgery Bilateral     Current Outpatient Prescriptions  Medication Sig Dispense Refill  . bumetanide (BUMEX) 2 MG tablet Take 2 mg by mouth at bedtime.      . Cholecalciferol (VITAMIN D) 2000 UNITS tablet Take 2,000 Units by mouth daily.      . ciprofloxacin (CIPRO) 250 MG tablet Take 250 mg by mouth 2 (two) times daily as needed. For symptoms      . diltiazem (CARDIZEM CD) 180 MG 24 hr capsule Take 1 capsule (180 mg total) by mouth daily.  30 capsule  11  . levothyroxine (SYNTHROID, LEVOTHROID) 75 MCG tablet Take 75 mcg by mouth daily.      Marland Kitchen lisinopril (PRINIVIL,ZESTRIL) 20 MG tablet Take 20 mg by mouth daily.      Marland Kitchen  metoprolol succinate (TOPROL-XL) 100 MG 24 hr tablet TAKE 1 TABLET ONCE DAILY.  30 tablet  0  . Multiple Vitamins-Minerals (ICAPS) TABS Take 1 tablet by mouth 2 (two) times daily.      . potassium chloride SA (K-DUR,KLOR-CON) 20 MEQ tablet Take 40 mEq by mouth daily.      Marland Kitchen torsemide (DEMADEX) 20 MG tablet       . warfarin (COUMADIN) 3 MG tablet Take as directed by coumadin clinic  30 tablet  3   No current facility-administered medications for this visit.    Allergies:    Allergies  Allergen Reactions  . Plendil [Felodipine]   . Adhesive [Tape]     Rash   . Aspirin     Stomach upset  . Barbiturates     Poorly tolerated  . Eggs Or Egg-Derived Products     diarrhea  . Epinephrine     Fever   . Felodipine Er     headache  . Iodinated  Diagnostic Agents     Kidney faiilure  . Morphine And Related     Rash   . Oxycodone Hcl     Heart race  . Penicillins     Fever   . Pravachol     Muscle ache  . Soy Allergy     shaking  . Sulfa Antibiotics     rash  . Wheat     Stomach upset    Social History:  The patient  reports that she has never smoked. She has never used smokeless tobacco. She reports that she does not drink alcohol or use illicit drugs.   ROS:  Please see the history of present illness.   No bleeding, no syncope, no strokelike symptoms, no chest pain.   All other systems reviewed and negative.   PHYSICAL EXAM: VS:  BP 120/82  Ht 5' 3.5" (1.613 m)  Wt 173 lb (78.472 kg)  BMI 30.16 kg/m2 Well nourished, well developed, in no acute distress HEENT: normal Neck: no JVD Cardiac:  Mildly irreg. S1, S2; RRR; 1/6 Systolic murmur apex Lungs:  clear to auscultation bilaterally, no wheezing, rhonchi or rales Abd: soft, nontender, no hepatomegalyobesity Ext: no edema Skin: warm and dry Neuro: no focal abnormalities noted   EKG:  Atrial fibrillation, 83, no changes from prior. 08/31/13-atrial fibrillation rate 85, nonspecific ST-T wave changes, no significant change from prior. Heart rate 85  ASSESSMENT AND PLAN:  80 year old with persistent atrial fibrillation/flutter currently rate controlled with chronic anticoagulation, chronic kidney disease stage IV.   1. Atrial fibrillation/flutter-continue with good rate control. Medications reviewed. Feels great 2. Chronic kidney disease stage IV-avoid NSAIDs. Currently on ACE inhibitor. Had questions today about Prolia bone density medication and Coumadin. She is also about to start Cipro for cystitis. We will relate to [facility], [place].D. for recommendations. Dr. Laurann Montana is her primary. 3. Chronic anticoagulation-continue to monitor Coumadin closely. No bleeding. 4. Mild LVH/moderate mitral regurgitation-no change in symptoms. Continue to monitor. 5. Weight  loss-excellent job, 20 pounds over the past 2 years. 6. Six-month followup  Signed, Candee Furbish, MD [facility]  08/31/2013 8:44 AM

## 2013-09-09 ENCOUNTER — Other Ambulatory Visit: Payer: Self-pay | Admitting: Cardiology

## 2013-10-11 ENCOUNTER — Ambulatory Visit (INDEPENDENT_AMBULATORY_CARE_PROVIDER_SITE_OTHER): Payer: Medicare Other | Admitting: Pharmacist

## 2013-10-11 DIAGNOSIS — I4892 Unspecified atrial flutter: Secondary | ICD-10-CM | POA: Diagnosis not present

## 2013-10-11 LAB — POCT INR: INR: 2.6

## 2013-10-28 DIAGNOSIS — Z23 Encounter for immunization: Secondary | ICD-10-CM | POA: Diagnosis not present

## 2013-11-22 ENCOUNTER — Ambulatory Visit (INDEPENDENT_AMBULATORY_CARE_PROVIDER_SITE_OTHER): Payer: Medicare Other | Admitting: Surgery

## 2013-11-22 DIAGNOSIS — I4892 Unspecified atrial flutter: Secondary | ICD-10-CM | POA: Diagnosis not present

## 2013-11-22 LAB — POCT INR: INR: 2.4

## 2013-11-25 DIAGNOSIS — Z961 Presence of intraocular lens: Secondary | ICD-10-CM | POA: Diagnosis not present

## 2013-11-25 DIAGNOSIS — H31013 Macula scars of posterior pole (postinflammatory) (post-traumatic), bilateral: Secondary | ICD-10-CM | POA: Diagnosis not present

## 2013-11-25 DIAGNOSIS — H3532 Exudative age-related macular degeneration: Secondary | ICD-10-CM | POA: Diagnosis not present

## 2014-01-03 ENCOUNTER — Ambulatory Visit (INDEPENDENT_AMBULATORY_CARE_PROVIDER_SITE_OTHER): Payer: Medicare Other

## 2014-01-03 DIAGNOSIS — I4892 Unspecified atrial flutter: Secondary | ICD-10-CM

## 2014-01-03 LAB — POCT INR: INR: 2.2

## 2014-01-17 DIAGNOSIS — L821 Other seborrheic keratosis: Secondary | ICD-10-CM | POA: Diagnosis not present

## 2014-01-17 DIAGNOSIS — D1801 Hemangioma of skin and subcutaneous tissue: Secondary | ICD-10-CM | POA: Diagnosis not present

## 2014-01-17 DIAGNOSIS — D225 Melanocytic nevi of trunk: Secondary | ICD-10-CM | POA: Diagnosis not present

## 2014-01-17 DIAGNOSIS — L814 Other melanin hyperpigmentation: Secondary | ICD-10-CM | POA: Diagnosis not present

## 2014-02-14 ENCOUNTER — Ambulatory Visit (INDEPENDENT_AMBULATORY_CARE_PROVIDER_SITE_OTHER): Payer: Medicare Other | Admitting: Pharmacist

## 2014-02-14 DIAGNOSIS — I4892 Unspecified atrial flutter: Secondary | ICD-10-CM

## 2014-02-14 LAB — POCT INR: INR: 2.4

## 2014-02-24 DIAGNOSIS — I4891 Unspecified atrial fibrillation: Secondary | ICD-10-CM | POA: Diagnosis not present

## 2014-02-24 DIAGNOSIS — D649 Anemia, unspecified: Secondary | ICD-10-CM | POA: Diagnosis not present

## 2014-02-24 DIAGNOSIS — Z1389 Encounter for screening for other disorder: Secondary | ICD-10-CM | POA: Diagnosis not present

## 2014-02-24 DIAGNOSIS — N183 Chronic kidney disease, stage 3 (moderate): Secondary | ICD-10-CM | POA: Diagnosis not present

## 2014-02-24 DIAGNOSIS — E039 Hypothyroidism, unspecified: Secondary | ICD-10-CM | POA: Diagnosis not present

## 2014-02-24 DIAGNOSIS — I129 Hypertensive chronic kidney disease with stage 1 through stage 4 chronic kidney disease, or unspecified chronic kidney disease: Secondary | ICD-10-CM | POA: Diagnosis not present

## 2014-03-02 ENCOUNTER — Ambulatory Visit (INDEPENDENT_AMBULATORY_CARE_PROVIDER_SITE_OTHER): Payer: Medicare Other | Admitting: Cardiology

## 2014-03-02 ENCOUNTER — Encounter: Payer: Self-pay | Admitting: Cardiology

## 2014-03-02 VITALS — BP 114/80 | HR 83 | Ht 63.5 in | Wt 178.0 lb

## 2014-03-02 DIAGNOSIS — Z7901 Long term (current) use of anticoagulants: Secondary | ICD-10-CM

## 2014-03-02 DIAGNOSIS — I4892 Unspecified atrial flutter: Secondary | ICD-10-CM

## 2014-03-02 DIAGNOSIS — N184 Chronic kidney disease, stage 4 (severe): Secondary | ICD-10-CM

## 2014-03-02 DIAGNOSIS — I340 Nonrheumatic mitral (valve) insufficiency: Secondary | ICD-10-CM

## 2014-03-02 NOTE — Progress Notes (Signed)
Patient ID: [name], female   DOB: 02-05-1932, 81 y.o.   MRN: [id number]       [address], [address] [place], [place]  [address] Phone: [phone] Fax:  [phone]  Date:  03/02/2014   ID:  [name], DOB 1931/04/30, MRN [id number]  PCP:  Irven Shelling, MD   History of Present Illness: [name] is a 81 y.o. female with history of atrial flutter, chronic kidney disease here for followup.   She has been admitted in the past with atrial flutter heart rate of 145 beats per minute with rapid ventricular response. Her symptoms were sensation of palpitations, shakiness. No prior cardioversions took place. She has been rate controlled.  At previous visits, I have titrated her metoprolol. Diltiazem also. She takes Bumex as well. Shortness of breath felt to be in part secondary to diastolic heart failure.  10/04/11- Holter - persistent atrial fibrillation/flutter average heart rate 94 beats per minute. 8/13-Echocardiogram-normal ejection fraction, dilated left atrium, moderate mitral regurgitation  Overall she's doing very well. No complaints. [place]. Had some questions regarding medicines as below.  03/02/13 - Dr. Laurann Montana has changed synthroid, decreased BP meds. Feels so much better. No palpitations, no CP, no SOB unless up hill.   Wt Readings from Last 3 Encounters:  03/02/14 178 lb (80.74 kg)  08/31/13 173 lb (78.472 kg)  11/27/12 180 lb 6.4 oz (81.829 kg)     Past Medical History  Diagnosis Date  . Hypertension   . Renal disorder   . Cancer   . Complication of anesthesia     sensitive to drugs  . Shortness of breath   . Hypothyroidism   . Blood transfusion     " no reaction to transfusion"  . Headache(784.0)   . Dysrhythmia     atrial flutter  . Leiomyosarcoma of uterus     Chemotherapy, Magrinat and Clarke-Pearson  . Visual loss, right eye     Left ocular accident  . UTI (urinary  tract infection)     Frequent  . Hyperlipidemia   . Migraine headache     Improved after menopause  . Shortness of breath     Cardiology Eval., possible diastolic dysfunctio  . Impaired fasting glucose   . Kidney disease, chronic, stage IV (GFR 15-29 ml/min)   . Vitamin D deficiency   . Wrist fracture, right 12/2009  . Macular degeneration   . Atrial fibrillation   . Mitral regurgitation 11/27/2012    Past Surgical History  Procedure Laterality Date  . Tonsillectomy    . Total abdominal hysterectomy w/ bilateral salpingoophorectomy  01/2012    , Partial resection of the sigmoid colon  . Nerve biopsy    . Blepharoplasty    . Cataract surgery Bilateral     Current Outpatient Prescriptions  Medication Sig Dispense Refill  . Cholecalciferol (VITAMIN D) 2000 UNITS tablet Take 2,000 Units by mouth daily.    . ciprofloxacin (CIPRO) 250 MG tablet Take 250 mg by mouth 2 (two) times daily as needed. For symptoms    . diltiazem (CARDIZEM CD) 180 MG 24 hr capsule Take 1 capsule (180 mg total) by mouth daily. 30 capsule 11  . levothyroxine (SYNTHROID, LEVOTHROID) 75 MCG tablet Take 75 mcg by mouth every other day.     . lisinopril (PRINIVIL,ZESTRIL) 10 MG tablet Take 10 mg by mouth daily.    Marland Kitchen loratadine (CLARITIN) 10  MG tablet Take 10 mg by mouth daily.    . metoprolol succinate (TOPROL-XL) 100 MG 24 hr tablet TAKE 1 TABLET ONCE DAILY. (Patient taking differently: TAKE 1 TABLET BY MOUTH ONCE DAILY.) 30 tablet 5  . Multiple Vitamins-Minerals (ICAPS) TABS Take 1 tablet by mouth 2 (two) times daily.    . potassium chloride SA (K-DUR,KLOR-CON) 20 MEQ tablet Take 20 mEq by mouth daily.     Marland Kitchen SYNTHROID 50 MCG tablet Take 50 mcg by mouth every other day.     . torsemide (DEMADEX) 20 MG tablet     . warfarin (COUMADIN) 3 MG tablet Take as directed by coumadin clinic 30 tablet 3   No current facility-administered medications for this visit.    Allergies:    Allergies  Allergen Reactions  .  Plendil [Felodipine]   . Adhesive [Tape]     Rash   . Aspirin     Stomach upset  . Barbiturates     Poorly tolerated  . Eggs Or Egg-Derived Products     diarrhea  . Epinephrine     Fever   . Felodipine Er     headache  . Iodinated Diagnostic Agents     Kidney faiilure  . Morphine And Related     Rash   . Oxycodone Hcl     Heart race  . Penicillins     Fever   . Pravachol     Muscle ache  . Soy Allergy     shaking  . Sulfa Antibiotics     rash  . Wheat     Stomach upset    Social History:  The patient  reports that she has never smoked. She has never used smokeless tobacco. She reports that she does not drink alcohol or use illicit drugs.   ROS:  Please see the history of present illness.   No bleeding, no syncope, no strokelike symptoms, no chest pain.   All other systems reviewed and negative.   PHYSICAL EXAM: VS:  BP 114/80 mmHg  Pulse 83  Ht 5' 3.5" (1.613 m)  Wt 178 lb (80.74 kg)  BMI 31.03 kg/m2 Well nourished, well developed, in no acute distress HEENT: normal Neck: no JVD Cardiac:  Mildly irreg. S1, S2; RRR; 1/6 Systolic murmur apex Lungs:  clear to auscultation bilaterally, no wheezing, rhonchi or rales Abd: soft, nontender, no hepatomegalyobesity Ext: no pitting edema  Skin: warm and dry Neuro: no focal abnormalities noted   EKG:  Atrial fibrillation, 83, no changes from prior. 08/31/13-atrial fibrillation rate 85, nonspecific ST-T wave changes, no significant change from prior. Heart rate 85  ASSESSMENT AND PLAN:  81 year old with persistent atrial fibrillation/flutter currently rate controlled with chronic anticoagulation, chronic kidney disease stage IV.   1. Atrial fibrillation/flutter-continue with good rate control. Medications reviewed. Feels great. Gave me a hug. Currently good rate control. 2. Chronic kidney disease stage IV-avoid NSAIDs. Currently on ACE inhibitor. Dr. Laurann Montana is her primary.  3. Chronic anticoagulation-continue to  monitor Coumadin closely. No bleeding. 4. Mild LVH/moderate mitral regurgitation-no change in symptoms. Continue to monitor. Soft murmur. 5. Weight loss-excellent job, 20 pounds over the past 2 years. Minimal weight gain recently. Continuing with torsemide. 6. 23-month followup, she would like to come in the fall and in the spring.  Signed, Candee Furbish, MD [facility]  03/02/2014 8:16 AM

## 2014-03-02 NOTE — Patient Instructions (Signed)
Your physician recommends that you continue on your current medications as directed. Please refer to the Current Medication list given to you today.  Your physician wants you to follow-up in: 9 months with Dr.Skains You will receive a reminder letter in the mail two months in advance. If you don't receive a letter, please call our office to schedule the follow-up appointment.

## 2014-03-10 ENCOUNTER — Other Ambulatory Visit: Payer: Self-pay | Admitting: Cardiology

## 2014-03-28 ENCOUNTER — Ambulatory Visit (INDEPENDENT_AMBULATORY_CARE_PROVIDER_SITE_OTHER): Payer: Medicare Other | Admitting: *Deleted

## 2014-03-28 DIAGNOSIS — I129 Hypertensive chronic kidney disease with stage 1 through stage 4 chronic kidney disease, or unspecified chronic kidney disease: Secondary | ICD-10-CM | POA: Diagnosis not present

## 2014-03-28 DIAGNOSIS — I4892 Unspecified atrial flutter: Secondary | ICD-10-CM | POA: Diagnosis not present

## 2014-03-28 DIAGNOSIS — E039 Hypothyroidism, unspecified: Secondary | ICD-10-CM | POA: Diagnosis not present

## 2014-03-28 LAB — POCT INR: INR: 2.4

## 2014-05-09 ENCOUNTER — Ambulatory Visit (INDEPENDENT_AMBULATORY_CARE_PROVIDER_SITE_OTHER): Payer: Medicare Other

## 2014-05-09 DIAGNOSIS — I4892 Unspecified atrial flutter: Secondary | ICD-10-CM

## 2014-05-09 LAB — POCT INR: INR: 2.4

## 2014-05-30 DIAGNOSIS — E039 Hypothyroidism, unspecified: Secondary | ICD-10-CM | POA: Diagnosis not present

## 2014-06-02 DIAGNOSIS — H31013 Macula scars of posterior pole (postinflammatory) (post-traumatic), bilateral: Secondary | ICD-10-CM | POA: Diagnosis not present

## 2014-06-02 DIAGNOSIS — H3532 Exudative age-related macular degeneration: Secondary | ICD-10-CM | POA: Diagnosis not present

## 2014-06-02 DIAGNOSIS — Z961 Presence of intraocular lens: Secondary | ICD-10-CM | POA: Diagnosis not present

## 2014-06-20 ENCOUNTER — Ambulatory Visit (INDEPENDENT_AMBULATORY_CARE_PROVIDER_SITE_OTHER): Payer: Medicare Other | Admitting: *Deleted

## 2014-06-20 DIAGNOSIS — I4892 Unspecified atrial flutter: Secondary | ICD-10-CM | POA: Diagnosis not present

## 2014-06-20 DIAGNOSIS — Z5181 Encounter for therapeutic drug level monitoring: Secondary | ICD-10-CM | POA: Insufficient documentation

## 2014-06-20 LAB — POCT INR: INR: 2.3

## 2014-07-12 DIAGNOSIS — Z961 Presence of intraocular lens: Secondary | ICD-10-CM | POA: Diagnosis not present

## 2014-07-12 DIAGNOSIS — H3530 Unspecified macular degeneration: Secondary | ICD-10-CM | POA: Diagnosis not present

## 2014-07-12 DIAGNOSIS — H52223 Regular astigmatism, bilateral: Secondary | ICD-10-CM | POA: Diagnosis not present

## 2014-07-12 DIAGNOSIS — H3532 Exudative age-related macular degeneration: Secondary | ICD-10-CM | POA: Diagnosis not present

## 2014-07-12 DIAGNOSIS — H5201 Hypermetropia, right eye: Secondary | ICD-10-CM | POA: Diagnosis not present

## 2014-07-12 DIAGNOSIS — D3141 Benign neoplasm of right ciliary body: Secondary | ICD-10-CM | POA: Diagnosis not present

## 2014-08-01 ENCOUNTER — Ambulatory Visit (INDEPENDENT_AMBULATORY_CARE_PROVIDER_SITE_OTHER): Payer: Medicare Other | Admitting: Pharmacist

## 2014-08-01 DIAGNOSIS — Z5181 Encounter for therapeutic drug level monitoring: Secondary | ICD-10-CM

## 2014-08-01 DIAGNOSIS — I4892 Unspecified atrial flutter: Secondary | ICD-10-CM

## 2014-08-01 LAB — POCT INR: INR: 2.5

## 2014-08-09 ENCOUNTER — Other Ambulatory Visit: Payer: Self-pay | Admitting: Cardiology

## 2014-08-17 DIAGNOSIS — E039 Hypothyroidism, unspecified: Secondary | ICD-10-CM | POA: Diagnosis not present

## 2014-08-17 DIAGNOSIS — N183 Chronic kidney disease, stage 3 (moderate): Secondary | ICD-10-CM | POA: Diagnosis not present

## 2014-08-17 DIAGNOSIS — I10 Essential (primary) hypertension: Secondary | ICD-10-CM | POA: Diagnosis not present

## 2014-08-31 ENCOUNTER — Other Ambulatory Visit: Payer: Self-pay

## 2014-08-31 DIAGNOSIS — Z1231 Encounter for screening mammogram for malignant neoplasm of breast: Secondary | ICD-10-CM

## 2014-09-06 ENCOUNTER — Other Ambulatory Visit: Payer: Self-pay | Admitting: Cardiology

## 2014-09-12 ENCOUNTER — Ambulatory Visit
Admission: RE | Admit: 2014-09-12 | Discharge: 2014-09-12 | Disposition: A | Discharge status: Home / Self Care | Payer: Medicare Other | Source: Ambulatory Visit

## 2014-09-12 ENCOUNTER — Ambulatory Visit (INDEPENDENT_AMBULATORY_CARE_PROVIDER_SITE_OTHER): Payer: Medicare Other | Admitting: *Deleted

## 2014-09-12 DIAGNOSIS — Z5181 Encounter for therapeutic drug level monitoring: Secondary | ICD-10-CM

## 2014-09-12 DIAGNOSIS — I4892 Unspecified atrial flutter: Secondary | ICD-10-CM | POA: Diagnosis not present

## 2014-09-12 DIAGNOSIS — Z1231 Encounter for screening mammogram for malignant neoplasm of breast: Secondary | ICD-10-CM | POA: Diagnosis not present

## 2014-09-12 LAB — POCT INR: INR: 2.9

## 2014-10-25 ENCOUNTER — Ambulatory Visit (INDEPENDENT_AMBULATORY_CARE_PROVIDER_SITE_OTHER): Payer: Medicare Other | Admitting: *Deleted

## 2014-10-25 DIAGNOSIS — Z5181 Encounter for therapeutic drug level monitoring: Secondary | ICD-10-CM | POA: Diagnosis not present

## 2014-10-25 DIAGNOSIS — I4892 Unspecified atrial flutter: Secondary | ICD-10-CM

## 2014-10-25 LAB — POCT INR: INR: 2.2

## 2014-11-15 DIAGNOSIS — Z23 Encounter for immunization: Secondary | ICD-10-CM | POA: Diagnosis not present

## 2014-11-28 ENCOUNTER — Ambulatory Visit (INDEPENDENT_AMBULATORY_CARE_PROVIDER_SITE_OTHER): Payer: Medicare Other | Admitting: *Deleted

## 2014-11-28 ENCOUNTER — Encounter: Payer: Self-pay | Admitting: Cardiology

## 2014-11-28 ENCOUNTER — Ambulatory Visit (INDEPENDENT_AMBULATORY_CARE_PROVIDER_SITE_OTHER): Payer: Medicare Other | Admitting: Cardiology

## 2014-11-28 VITALS — BP 138/82 | HR 72 | Ht 63.0 in | Wt 177.4 lb

## 2014-11-28 DIAGNOSIS — I340 Nonrheumatic mitral (valve) insufficiency: Secondary | ICD-10-CM | POA: Diagnosis not present

## 2014-11-28 DIAGNOSIS — Z7901 Long term (current) use of anticoagulants: Secondary | ICD-10-CM

## 2014-11-28 DIAGNOSIS — N184 Chronic kidney disease, stage 4 (severe): Secondary | ICD-10-CM

## 2014-11-28 DIAGNOSIS — I4892 Unspecified atrial flutter: Secondary | ICD-10-CM

## 2014-11-28 DIAGNOSIS — Z5181 Encounter for therapeutic drug level monitoring: Secondary | ICD-10-CM

## 2014-11-28 LAB — POCT INR: INR: 2.4

## 2014-11-28 NOTE — Patient Instructions (Signed)
Medication Instructions:  The current medical regimen is effective;  continue present plan and medications.  Follow-Up: Follow up in 6 months with Dr. Skains.  You will receive a letter in the mail 2 months before you are due.  Please call us when you receive this letter to schedule your follow up appointment.  Thank you for choosing [facility]!!     

## 2014-11-28 NOTE — Progress Notes (Signed)
Patient ID: [name], female   DOB: 11-22-1931, 81 y.o.   MRN: [id number]       [address], [address] [place], [place]  [address] Phone: [phone] Fax:  [phone]  Date:  11/28/2014   ID:  [name], DOB 06-25-1931, MRN [id number]  PCP:  Irven Shelling, MD   History of Present Illness: [name] is a 81 y.o. female with history of atrial flutter, chronic kidney disease here for followup.   She has been admitted in the past with atrial flutter heart rate of 145 beats per minute with rapid ventricular response. Her symptoms were sensation of palpitations, shakiness. No prior cardioversions took place. She has been rate controlled.  At previous visits, I have titrated her metoprolol. Diltiazem also. She takes Bumex as well. Shortness of breath felt to be in part secondary to diastolic heart failure.  10/04/11- Holter - persistent atrial fibrillation/flutter average heart rate 94 beats per minute.  09/2011-Echocardiogram-normal ejection fraction, dilated left atrium, moderate mitral regurgitation  Overall she's doing very well. No complaints. [place]. Had some questions regarding medicines as below.  03/02/13 - Dr. Laurann Montana has changed synthroid, decreased BP meds. Feels so much better. No palpitations, no CP, no SOB unless up hill.   11/28/14-no syncope and change her prior. Right eye cyst is new. She is going to be seeing eye doctor soon. Baseline shortness of breath when walking to mailbox, she has had since she was 81 years old she states. Overall no bleeding, no side effects of medications. Sometimes feels palpitations when laying on left side.  Wt Readings from Last 3 Encounters:  11/28/14 177 lb 6.4 oz (80.468 kg)  03/02/14 178 lb (80.74 kg)  08/31/13 173 lb (78.472 kg)     Past Medical History  Diagnosis Date  . Hypertension   . Renal disorder   . Cancer ([place])   . Complication of  anesthesia     sensitive to drugs  . Shortness of breath   . Hypothyroidism   . Blood transfusion     " no reaction to transfusion"  . Headache(784.0)   . Dysrhythmia     atrial flutter  . 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 Visual loss, right eye     Left ocular accident  . UTI (urinary tract infection)     Frequent  . Hyperlipidemia   . Migraine headache     Improved after menopause  . Shortness of breath     Cardiology Eval., possible diastolic dysfunctio  . Impaired fasting glucose   . Kidney disease, chronic, stage IV (GFR 15-29 ml/min) (HCC)   . Vitamin D deficiency   . Wrist fracture, right 12/2009  . Macular degeneration   . Atrial fibrillation ([place])   . Mitral regurgitation 11/27/2012    Past Surgical History  Procedure Laterality Date  . Tonsillectomy    . Total abdominal hysterectomy w/ bilateral salpingoophorectomy  01/2012    , Partial resection of the sigmoid colon  . Nerve biopsy    . Blepharoplasty    . Cataract surgery Bilateral     Current Outpatient Prescriptions  Medication Sig Dispense Refill  . Cholecalciferol (VITAMIN D) 2000 UNITS tablet Take 2,000 Units by mouth daily.    . ciprofloxacin (CIPRO) 250 MG tablet Take 250 mg by mouth 2 (two) times daily as needed. For symptoms    .  diltiazem (CARDIZEM CD) 180 MG 24 hr capsule TAKE 1 CAPSULE IN THE MORNING ON AN EMPTY STOMACH. 30 capsule 3  . levothyroxine (SYNTHROID, LEVOTHROID) 75 MCG tablet Take 75 mcg by mouth every other day.     . lisinopril (PRINIVIL,ZESTRIL) 10 MG tablet Take 10 mg by mouth daily.    Marland Kitchen loratadine (CLARITIN) 10 MG tablet Take 10 mg by mouth daily.    . metoprolol succinate (TOPROL-XL) 100 MG 24 hr tablet TAKE 1 TABLET ONCE DAILY. 30 tablet 9  . Multiple Vitamins-Minerals (ICAPS) TABS Take 1 tablet by mouth 2 (two) times daily.    . potassium chloride SA (K-DUR,KLOR-CON) 20 MEQ tablet Take 20 mEq by mouth daily.     Marland Kitchen SYNTHROID 50  MCG tablet Take 50 mcg by mouth every other day.     . torsemide (DEMADEX) 20 MG tablet     . warfarin (COUMADIN) 3 MG tablet Take as directed by [facility] 30 tablet 3   No current facility-administered medications for this visit.    Allergies:    Allergies  Allergen Reactions  . Plendil [Felodipine]   . Adhesive [Tape]     Rash   . Aspirin     Stomach upset  . Barbiturates     Poorly tolerated  . Eggs Or Egg-Derived Products     diarrhea  . Epinephrine     Fever   . Felodipine Er     headache  . Iodinated Diagnostic Agents     Kidney faiilure  . Morphine And Related     Rash   . Oxycodone Hcl     Heart race  . Penicillins     Fever   . Pravachol     Muscle ache  . Soy Allergy     shaking  . Sulfa Antibiotics     rash  . Wheat     Stomach upset    Social History:  The patient  reports that she has never smoked. She has never used smokeless tobacco. She reports that she does not drink alcohol or use illicit drugs.   ROS:  Please see the history of present illness.   No bleeding, no syncope, no strokelike symptoms, no chest pain.   All other systems reviewed and negative.   PHYSICAL EXAM: VS:  BP 138/82 mmHg  Pulse 72  Ht 5\' 3"  (1.6 m)  Wt 177 lb 6.4 oz (80.468 kg)  BMI 31.43 kg/m2 Well nourished, well developed, in no acute distress HEENT: normalRight eye cyst Neck: no JVD Cardiac:  Mildly irreg. S1, S2; normal rate; 1/6 Systolic murmur apex Lungs:  clear to auscultation bilaterally, no wheezing, rhonchi or rales Abd: soft, nontender, no hepatomegalyobesity Ext: no pitting edema, chronic subcutaneous tissue bilateral lower extremities Skin: warm and dry Neuro: no focal abnormalities noted   EKG: Today 11/28/14- atrial fibrillation heart rate 78 with borderline low voltage, no significant change from prior personally viewed-Atrial fibrillation, 83, no changes from prior. 08/31/13-atrial fibrillation rate 85, nonspecific ST-T wave changes, no  significant change from prior. Heart rate 85  Echo: 2014-normal EF, moderate mitral regurgitation  ASSESSMENT AND PLAN:  81 year old with persistent atrial fibrillation/flutter currently rate controlled with chronic anticoagulation, chronic kidney disease stage IV.   1. Atrial fibrillation/flutter-continue with good rate control. On both metoprolol as well as diltiazem. Medications reviewed. Feels great. Currently good rate control. 2. Chronic kidney disease stage IV-avoid NSAIDs. Currently on ACE inhibitor. Dr. Laurann Montana is her primary.  3. Chronic anticoagulation-continue to monitor  Coumadin closely. No bleeding. She has her Coumadin checked here. This morning, therapeutic at 2.4. 4. Mild LVH/moderate mitral regurgitation-no change in symptoms. Continue to monitor. Soft murmur. 5. Weight loss-excellent job, 20 pounds over the past 2 years. Minimal weight gain recently. Continuing with torsemide. 6. 15-month followup, she would like to come in the fall and in the spring. She is going to eye MD soon  Signed, Candee Furbish, MD [facility]  11/28/2014 7:52 AM

## 2014-12-07 ENCOUNTER — Other Ambulatory Visit: Payer: Self-pay | Admitting: Cardiology

## 2014-12-08 DIAGNOSIS — H353212 Exudative age-related macular degeneration, right eye, with inactive choroidal neovascularization: Secondary | ICD-10-CM | POA: Diagnosis not present

## 2014-12-08 DIAGNOSIS — Z961 Presence of intraocular lens: Secondary | ICD-10-CM | POA: Diagnosis not present

## 2014-12-08 DIAGNOSIS — H43813 Vitreous degeneration, bilateral: Secondary | ICD-10-CM | POA: Diagnosis not present

## 2014-12-08 DIAGNOSIS — H353221 Exudative age-related macular degeneration, left eye, with active choroidal neovascularization: Secondary | ICD-10-CM | POA: Diagnosis not present

## 2015-01-03 ENCOUNTER — Other Ambulatory Visit: Payer: Self-pay | Admitting: Cardiology

## 2015-01-09 ENCOUNTER — Ambulatory Visit (INDEPENDENT_AMBULATORY_CARE_PROVIDER_SITE_OTHER): Payer: Medicare Other | Admitting: *Deleted

## 2015-01-09 DIAGNOSIS — Z5181 Encounter for therapeutic drug level monitoring: Secondary | ICD-10-CM | POA: Diagnosis not present

## 2015-01-09 DIAGNOSIS — I4892 Unspecified atrial flutter: Secondary | ICD-10-CM | POA: Diagnosis not present

## 2015-01-09 LAB — POCT INR: INR: 2.4

## 2015-02-17 DIAGNOSIS — N183 Chronic kidney disease, stage 3 (moderate): Secondary | ICD-10-CM | POA: Diagnosis not present

## 2015-02-17 DIAGNOSIS — I481 Persistent atrial fibrillation: Secondary | ICD-10-CM | POA: Diagnosis not present

## 2015-02-17 DIAGNOSIS — R7301 Impaired fasting glucose: Secondary | ICD-10-CM | POA: Diagnosis not present

## 2015-02-17 DIAGNOSIS — I5032 Chronic diastolic (congestive) heart failure: Secondary | ICD-10-CM | POA: Diagnosis not present

## 2015-02-17 DIAGNOSIS — G4700 Insomnia, unspecified: Secondary | ICD-10-CM | POA: Diagnosis not present

## 2015-02-17 DIAGNOSIS — I129 Hypertensive chronic kidney disease with stage 1 through stage 4 chronic kidney disease, or unspecified chronic kidney disease: Secondary | ICD-10-CM | POA: Diagnosis not present

## 2015-02-17 DIAGNOSIS — M810 Age-related osteoporosis without current pathological fracture: Secondary | ICD-10-CM | POA: Diagnosis not present

## 2015-02-17 DIAGNOSIS — R21 Rash and other nonspecific skin eruption: Secondary | ICD-10-CM | POA: Diagnosis not present

## 2015-02-17 DIAGNOSIS — Z0000 Encounter for general adult medical examination without abnormal findings: Secondary | ICD-10-CM | POA: Diagnosis not present

## 2015-02-17 DIAGNOSIS — E039 Hypothyroidism, unspecified: Secondary | ICD-10-CM | POA: Diagnosis not present

## 2015-02-17 DIAGNOSIS — Z1389 Encounter for screening for other disorder: Secondary | ICD-10-CM | POA: Diagnosis not present

## 2015-02-21 ENCOUNTER — Ambulatory Visit (INDEPENDENT_AMBULATORY_CARE_PROVIDER_SITE_OTHER): Payer: Medicare Other | Admitting: Pharmacist

## 2015-02-21 DIAGNOSIS — Z5181 Encounter for therapeutic drug level monitoring: Secondary | ICD-10-CM

## 2015-02-21 DIAGNOSIS — I4892 Unspecified atrial flutter: Secondary | ICD-10-CM | POA: Diagnosis not present

## 2015-02-21 LAB — POCT INR: INR: 2.3

## 2015-03-10 ENCOUNTER — Other Ambulatory Visit: Payer: Self-pay | Admitting: Cardiology

## 2015-04-04 ENCOUNTER — Ambulatory Visit (INDEPENDENT_AMBULATORY_CARE_PROVIDER_SITE_OTHER): Payer: Medicare Other

## 2015-04-04 ENCOUNTER — Telehealth: Payer: Self-pay | Admitting: Cardiology

## 2015-04-04 DIAGNOSIS — I4892 Unspecified atrial flutter: Secondary | ICD-10-CM

## 2015-04-04 DIAGNOSIS — Z5181 Encounter for therapeutic drug level monitoring: Secondary | ICD-10-CM | POA: Diagnosis not present

## 2015-04-04 LAB — POCT INR: INR: 1.8

## 2015-04-04 NOTE — Telephone Encounter (Signed)
Telephoned pt and she just wanted to make sure that she could drink Pineapple juice and orange juice using Coumadin. Informed her that its okay to use those juices but not use use grapefruit juice with her  calcium channel blockers.

## 2015-04-04 NOTE — Telephone Encounter (Signed)
New message      Pt was seen this am.  She has a question for Tiffany.

## 2015-04-11 ENCOUNTER — Other Ambulatory Visit: Payer: Self-pay | Admitting: Cardiology

## 2015-05-01 ENCOUNTER — Ambulatory Visit (INDEPENDENT_AMBULATORY_CARE_PROVIDER_SITE_OTHER): Payer: Medicare Other | Admitting: *Deleted

## 2015-05-01 DIAGNOSIS — I4892 Unspecified atrial flutter: Secondary | ICD-10-CM | POA: Diagnosis not present

## 2015-05-01 DIAGNOSIS — Z5181 Encounter for therapeutic drug level monitoring: Secondary | ICD-10-CM

## 2015-05-01 LAB — POCT INR: INR: 2.1

## 2015-06-01 DIAGNOSIS — H353212 Exudative age-related macular degeneration, right eye, with inactive choroidal neovascularization: Secondary | ICD-10-CM | POA: Diagnosis not present

## 2015-06-01 DIAGNOSIS — H43813 Vitreous degeneration, bilateral: Secondary | ICD-10-CM | POA: Diagnosis not present

## 2015-06-01 DIAGNOSIS — H353221 Exudative age-related macular degeneration, left eye, with active choroidal neovascularization: Secondary | ICD-10-CM | POA: Diagnosis not present

## 2015-06-01 DIAGNOSIS — Z961 Presence of intraocular lens: Secondary | ICD-10-CM | POA: Diagnosis not present

## 2015-06-08 ENCOUNTER — Other Ambulatory Visit: Payer: Self-pay | Admitting: Cardiology

## 2015-06-12 ENCOUNTER — Ambulatory Visit (INDEPENDENT_AMBULATORY_CARE_PROVIDER_SITE_OTHER): Payer: Medicare Other | Admitting: Cardiology

## 2015-06-12 ENCOUNTER — Ambulatory Visit (INDEPENDENT_AMBULATORY_CARE_PROVIDER_SITE_OTHER): Payer: Medicare Other | Admitting: *Deleted

## 2015-06-12 ENCOUNTER — Encounter: Payer: Self-pay | Admitting: Cardiology

## 2015-06-12 VITALS — BP 120/70 | HR 72 | Ht 63.0 in | Wt 185.0 lb

## 2015-06-12 DIAGNOSIS — I340 Nonrheumatic mitral (valve) insufficiency: Secondary | ICD-10-CM | POA: Diagnosis not present

## 2015-06-12 DIAGNOSIS — Z7901 Long term (current) use of anticoagulants: Secondary | ICD-10-CM | POA: Diagnosis not present

## 2015-06-12 DIAGNOSIS — I4892 Unspecified atrial flutter: Secondary | ICD-10-CM

## 2015-06-12 DIAGNOSIS — N184 Chronic kidney disease, stage 4 (severe): Secondary | ICD-10-CM

## 2015-06-12 DIAGNOSIS — Z5181 Encounter for therapeutic drug level monitoring: Secondary | ICD-10-CM

## 2015-06-12 LAB — POCT INR: INR: 2.6

## 2015-06-12 MED ORDER — METOPROLOL SUCCINATE ER 100 MG PO TB24: 100.0000 mg | ORAL_TABLET | Freq: Every day | ORAL | Status: DC

## 2015-06-12 MED ORDER — DILTIAZEM HCL ER COATED BEADS 180 MG PO CP24: ORAL_CAPSULE | ORAL | Status: DC

## 2015-06-12 NOTE — Patient Instructions (Signed)

## 2015-06-12 NOTE — Progress Notes (Signed)
Patient ID: [name], female   DOB: 1931/05/14, 82 y.o.   MRN: [id number]       [address], [address] [place], [place]  [address] Phone: [phone] Fax:  [phone]  Date:  06/12/2015   ID:  [name], DOB [birth year], MRN [id number]  PCP:  Irven Shelling, MD   History of Present Illness: [name] is a 82 y.o. female with history of atrial flutter, chronic kidney disease here for followup.   She has been admitted in the past with atrial flutter heart rate of 145 beats per minute with rapid ventricular response. Her symptoms were sensation of palpitations, shakiness. No prior cardioversions took place. She has been rate controlled.  At previous visits, I have titrated her metoprolol. Diltiazem also. She takes Bumex as well. Shortness of breath felt to be in part secondary to diastolic heart failure.  10/04/11- Holter - persistent atrial fibrillation/flutter average heart rate 94 beats per minute.  09/2011-Echocardiogram-normal ejection fraction, dilated left atrium, moderate mitral regurgitation  Overall she's doing very well. No complaints. [place], [place] breeze [place]. Had some questions regarding medicines as below.  03/02/13 - Dr. Laurann Montana has changed synthroid, decreased BP meds. Feels so much better. No palpitations, no CP, no SOB unless up hill.   11/28/14-no syncope and change her prior. Right eye cyst is new. She is going to be seeing eye doctor soon. Baseline shortness of breath when walking to mailbox, she has had since she was 82 years old she states. Overall no bleeding, no side effects of medications. Sometimes feels palpitations when laying on left side.  06/12/15-overall doing well, right eye cyst has resolved. Baseline shortness of breath noted. She has had left foot pain/arch pain. This has translated to some back pain. This is decreased her overall activity level. No syncope, no chest pain, no bleeding. She  is doing well with her Coumadin. Insomnia was her main problem.  Wt Readings from Last 3 Encounters:  06/12/15 185 lb (83.915 kg)  11/28/14 177 lb 6.4 oz (80.468 kg)  03/02/14 178 lb (80.74 kg)     Past Medical History  Diagnosis Date  . Hypertension   . Renal disorder   . Cancer ([place])   . Complication of anesthesia     sensitive to drugs  . Shortness of breath   . Hypothyroidism   . Blood transfusion     " no reaction to transfusion"  . Headache(784.0)   . Dysrhythmia     atrial flutter  . 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 Visual loss, right eye     Left ocular accident  . UTI (urinary tract infection)     Frequent  . Hyperlipidemia   . Migraine headache     Improved after menopause  . Shortness of breath     Cardiology Eval., possible diastolic dysfunctio  . Impaired fasting glucose   . Kidney disease, chronic, stage IV (GFR 15-29 ml/min) (HCC)   . Vitamin D deficiency   . Wrist fracture, right 12/2009  . Macular degeneration   . Atrial fibrillation ([place])   . Mitral regurgitation 11/27/2012    Past Surgical History  Procedure Laterality Date  . Tonsillectomy    . Total abdominal hysterectomy w/ bilateral salpingoophorectomy  01/2012    , Partial resection of the sigmoid colon  . Nerve biopsy    . Blepharoplasty    .  Cataract surgery Bilateral     Current Outpatient Prescriptions  Medication Sig Dispense Refill  . Cholecalciferol (VITAMIN D) 2000 UNITS tablet Take 2,000 Units by mouth daily.    . ciprofloxacin (CIPRO) 250 MG tablet Take 250 mg by mouth 2 (two) times daily as needed (for cystitis symptoms). For symptoms    . diltiazem (CARDIZEM CD) 180 MG 24 hr capsule TAKE 1 CAPSULE IN THE MORNING ON AN EMPTY STOMACH. 30 capsule 7  . levothyroxine (SYNTHROID, LEVOTHROID) 75 MCG tablet Take 75 mcg by mouth every other day.     . lisinopril (PRINIVIL,ZESTRIL) 10 MG tablet Take 10 mg by mouth daily.    Marland Kitchen loratadine  (CLARITIN) 10 MG tablet Take 10 mg by mouth daily as needed for allergies.     . metoprolol succinate (TOPROL-XL) 100 MG 24 hr tablet Take 100 mg by mouth daily. Take with or immediately following a meal.    . Multiple Vitamins-Minerals (ICAPS) TABS Take 1 tablet by mouth daily.     . potassium chloride SA (K-DUR,KLOR-CON) 20 MEQ tablet Take 20 mEq by mouth daily.     Marland Kitchen SYNTHROID 50 MCG tablet Take 50 mcg by mouth every other day.     . torsemide (DEMADEX) 20 MG tablet Take 20 mg by mouth every evening.     . warfarin (COUMADIN) 3 MG tablet TAKE AS DIRECTED BY COUMADIN CLINIC. 45 tablet 3   No current facility-administered medications for this visit.    Allergies:    Allergies  Allergen Reactions  . Plendil [Felodipine]   . Aspirin     Stomach upset  . Barbiturates     Poorly tolerated  . Eggs Or Egg-Derived Products     diarrhea  . Epinephrine     Fever   . Erythromycin Base Nausea And Vomiting    All mycins  . Felodipine Er     headache  . Iodinated Diagnostic Agents     Kidney faiilure  . Iodine Other (See Comments)    Kidneys shut down  . Morphine And Related     Rash   . Oxycodone Hcl     Heart race  . Penicillins     Fever   . Pravachol     Muscle ache  . Soy Allergy     shaking  . Sulfa Antibiotics     rash  . Wheat     Stomach upset  . Adhesive [Tape] Rash    Rash     Social History:  The patient  reports that she has never smoked. She has never used smokeless tobacco. She reports that she does not drink alcohol or use illicit drugs.   ROS:  Please see the history of present illness.   No bleeding, no syncope, no strokelike symptoms, no chest pain.   All other systems reviewed and negative.   PHYSICAL EXAM: VS:  BP 120/70 mmHg  Pulse 72  Ht 5\' 3"  (1.6 m)  Wt 185 lb (83.915 kg)  BMI 32.78 kg/m2 Well nourished, well developed, in no acute distress HEENT: normalRight eye cyst resolved Neck: no JVD Cardiac:  Mildly irreg. S1, S2; normal rate; 1/6  Systolic murmur apex Lungs:  clear to auscultation bilaterally, no wheezing, rhonchi or rales Abd: soft, nontender, no hepatomegalyobesity Ext: no pitting edema, chronic subcutaneous tissue bilateral lower extremities Skin: warm and dry Neuro: no focal abnormalities noted   EKG: Today 11/28/14- atrial fibrillation heart rate 78 with borderline low voltage, no significant change from prior  personally viewed-Atrial fibrillation, 83, no changes from prior. 08/31/13-atrial fibrillation rate 85, nonspecific ST-T wave changes, no significant change from prior. Heart rate 85  Echo: 2014-normal EF, moderate mitral regurgitation  LABS: 06/12/15: INR 2.6  ASSESSMENT AND PLAN:  82 year old with persistent atrial fibrillation/flutter currently rate controlled with chronic anticoagulation, chronic kidney disease stage IV.   1. Atrial fibrillation/flutter-continue with good rate control. On both metoprolol as well as diltiazem. Medications reviewed. Feels great. Currently good rate control. 2. Chronic kidney disease stage IV-avoid NSAIDs. Currently on ACE inhibitor. Dr. Laurann Montana is her primary.  3. Insomnia-asked her to discuss this further with Dr. Laurann Montana. She has a paradoxical response to Benadryl. Does not like the way melatonin makes her feel. 4. Chronic anticoagulation-continue to monitor Coumadin closely. No bleeding. She has her Coumadin checked here. This morning, therapeutic at 2.6. 5. Mild LVH/moderate mitral regurgitation-no change in symptoms. Continue to monitor. Soft murmur. Has some baseline shortness of breath. 6. Weight loss-previously 20 pounds over the past 2 years. Minimal weight gain recently. Continuing with torsemide. Previous water activity.  37-month followup Signed, Candee Furbish, MD [facility]  06/12/2015 8:00 AM

## 2015-06-15 ENCOUNTER — Ambulatory Visit: Payer: Medicare Other | Admitting: Cardiology

## 2015-07-17 ENCOUNTER — Ambulatory Visit (INDEPENDENT_AMBULATORY_CARE_PROVIDER_SITE_OTHER): Payer: Medicare Other | Admitting: *Deleted

## 2015-07-17 DIAGNOSIS — Z5181 Encounter for therapeutic drug level monitoring: Secondary | ICD-10-CM | POA: Diagnosis not present

## 2015-07-17 DIAGNOSIS — I4892 Unspecified atrial flutter: Secondary | ICD-10-CM | POA: Diagnosis not present

## 2015-07-17 LAB — POCT INR: INR: 2.7

## 2015-08-11 DIAGNOSIS — I5032 Chronic diastolic (congestive) heart failure: Secondary | ICD-10-CM | POA: Diagnosis not present

## 2015-08-11 DIAGNOSIS — I129 Hypertensive chronic kidney disease with stage 1 through stage 4 chronic kidney disease, or unspecified chronic kidney disease: Secondary | ICD-10-CM | POA: Diagnosis not present

## 2015-08-11 DIAGNOSIS — R269 Unspecified abnormalities of gait and mobility: Secondary | ICD-10-CM | POA: Diagnosis not present

## 2015-08-11 DIAGNOSIS — M810 Age-related osteoporosis without current pathological fracture: Secondary | ICD-10-CM | POA: Diagnosis not present

## 2015-08-11 DIAGNOSIS — N184 Chronic kidney disease, stage 4 (severe): Secondary | ICD-10-CM | POA: Diagnosis not present

## 2015-08-11 DIAGNOSIS — I481 Persistent atrial fibrillation: Secondary | ICD-10-CM | POA: Diagnosis not present

## 2015-08-14 ENCOUNTER — Ambulatory Visit (INDEPENDENT_AMBULATORY_CARE_PROVIDER_SITE_OTHER): Payer: Medicare Other | Admitting: *Deleted

## 2015-08-14 DIAGNOSIS — Z5181 Encounter for therapeutic drug level monitoring: Secondary | ICD-10-CM | POA: Diagnosis not present

## 2015-08-14 DIAGNOSIS — I4892 Unspecified atrial flutter: Secondary | ICD-10-CM

## 2015-08-14 LAB — POCT INR: INR: 3.2

## 2015-08-21 DIAGNOSIS — R531 Weakness: Secondary | ICD-10-CM | POA: Diagnosis not present

## 2015-08-21 DIAGNOSIS — R296 Repeated falls: Secondary | ICD-10-CM | POA: Diagnosis not present

## 2015-08-28 ENCOUNTER — Other Ambulatory Visit: Payer: Self-pay | Admitting: Internal Medicine

## 2015-08-28 DIAGNOSIS — Z1231 Encounter for screening mammogram for malignant neoplasm of breast: Secondary | ICD-10-CM

## 2015-09-04 ENCOUNTER — Ambulatory Visit (INDEPENDENT_AMBULATORY_CARE_PROVIDER_SITE_OTHER): Payer: Medicare Other | Admitting: Pharmacist

## 2015-09-04 DIAGNOSIS — I4892 Unspecified atrial flutter: Secondary | ICD-10-CM | POA: Diagnosis not present

## 2015-09-04 DIAGNOSIS — Z5181 Encounter for therapeutic drug level monitoring: Secondary | ICD-10-CM | POA: Diagnosis not present

## 2015-09-04 LAB — POCT INR: INR: 3

## 2015-09-21 ENCOUNTER — Ambulatory Visit
Admission: RE | Admit: 2015-09-21 | Discharge: 2015-09-21 | Disposition: A | Discharge status: Home / Self Care | Payer: Medicare Other | Source: Ambulatory Visit | Attending: Internal Medicine | Admitting: Internal Medicine

## 2015-09-21 DIAGNOSIS — D1801 Hemangioma of skin and subcutaneous tissue: Secondary | ICD-10-CM | POA: Diagnosis not present

## 2015-09-21 DIAGNOSIS — Z1231 Encounter for screening mammogram for malignant neoplasm of breast: Secondary | ICD-10-CM | POA: Diagnosis not present

## 2015-09-21 DIAGNOSIS — L579 Skin changes due to chronic exposure to nonionizing radiation, unspecified: Secondary | ICD-10-CM | POA: Diagnosis not present

## 2015-09-21 DIAGNOSIS — L814 Other melanin hyperpigmentation: Secondary | ICD-10-CM | POA: Diagnosis not present

## 2015-09-21 DIAGNOSIS — R296 Repeated falls: Secondary | ICD-10-CM | POA: Diagnosis not present

## 2015-09-21 DIAGNOSIS — R531 Weakness: Secondary | ICD-10-CM | POA: Diagnosis not present

## 2015-09-21 DIAGNOSIS — H353133 Nonexudative age-related macular degeneration, bilateral, advanced atrophic without subfoveal involvement: Secondary | ICD-10-CM | POA: Diagnosis not present

## 2015-09-21 DIAGNOSIS — L918 Other hypertrophic disorders of the skin: Secondary | ICD-10-CM | POA: Diagnosis not present

## 2015-09-21 DIAGNOSIS — L821 Other seborrheic keratosis: Secondary | ICD-10-CM | POA: Diagnosis not present

## 2015-09-21 DIAGNOSIS — D225 Melanocytic nevi of trunk: Secondary | ICD-10-CM | POA: Diagnosis not present

## 2015-09-26 DIAGNOSIS — R197 Diarrhea, unspecified: Secondary | ICD-10-CM | POA: Diagnosis not present

## 2015-09-26 DIAGNOSIS — Z1389 Encounter for screening for other disorder: Secondary | ICD-10-CM | POA: Diagnosis not present

## 2015-10-02 ENCOUNTER — Ambulatory Visit (INDEPENDENT_AMBULATORY_CARE_PROVIDER_SITE_OTHER): Payer: Medicare Other | Admitting: *Deleted

## 2015-10-02 DIAGNOSIS — I4892 Unspecified atrial flutter: Secondary | ICD-10-CM | POA: Diagnosis not present

## 2015-10-02 DIAGNOSIS — Z5181 Encounter for therapeutic drug level monitoring: Secondary | ICD-10-CM | POA: Diagnosis not present

## 2015-10-02 LAB — POCT INR: INR: 3.8

## 2015-10-16 ENCOUNTER — Ambulatory Visit (INDEPENDENT_AMBULATORY_CARE_PROVIDER_SITE_OTHER): Payer: Medicare Other | Admitting: Pharmacist

## 2015-10-16 DIAGNOSIS — I4892 Unspecified atrial flutter: Secondary | ICD-10-CM

## 2015-10-16 DIAGNOSIS — Z5181 Encounter for therapeutic drug level monitoring: Secondary | ICD-10-CM

## 2015-10-16 LAB — POCT INR: INR: 3.5

## 2015-11-02 ENCOUNTER — Ambulatory Visit (INDEPENDENT_AMBULATORY_CARE_PROVIDER_SITE_OTHER): Payer: Medicare Other | Admitting: *Deleted

## 2015-11-02 DIAGNOSIS — Z5181 Encounter for therapeutic drug level monitoring: Secondary | ICD-10-CM | POA: Diagnosis not present

## 2015-11-02 DIAGNOSIS — I4892 Unspecified atrial flutter: Secondary | ICD-10-CM | POA: Diagnosis not present

## 2015-11-02 LAB — POCT INR: INR: 3.3

## 2015-11-10 ENCOUNTER — Ambulatory Visit (INDEPENDENT_AMBULATORY_CARE_PROVIDER_SITE_OTHER): Payer: Medicare Other | Admitting: Pharmacist

## 2015-11-10 DIAGNOSIS — Z5181 Encounter for therapeutic drug level monitoring: Secondary | ICD-10-CM | POA: Diagnosis not present

## 2015-11-10 DIAGNOSIS — I4892 Unspecified atrial flutter: Secondary | ICD-10-CM

## 2015-11-10 LAB — POCT INR: INR: 2.5

## 2015-11-18 DIAGNOSIS — Z23 Encounter for immunization: Secondary | ICD-10-CM | POA: Diagnosis not present

## 2015-11-27 ENCOUNTER — Ambulatory Visit (INDEPENDENT_AMBULATORY_CARE_PROVIDER_SITE_OTHER): Payer: Medicare Other | Admitting: *Deleted

## 2015-11-27 DIAGNOSIS — I4892 Unspecified atrial flutter: Secondary | ICD-10-CM | POA: Diagnosis not present

## 2015-11-27 DIAGNOSIS — Z5181 Encounter for therapeutic drug level monitoring: Secondary | ICD-10-CM | POA: Diagnosis not present

## 2015-11-27 LAB — POCT INR: INR: 3.4

## 2015-12-07 DIAGNOSIS — Z961 Presence of intraocular lens: Secondary | ICD-10-CM | POA: Diagnosis not present

## 2015-12-07 DIAGNOSIS — H353212 Exudative age-related macular degeneration, right eye, with inactive choroidal neovascularization: Secondary | ICD-10-CM | POA: Diagnosis not present

## 2015-12-07 DIAGNOSIS — H43813 Vitreous degeneration, bilateral: Secondary | ICD-10-CM | POA: Diagnosis not present

## 2015-12-18 ENCOUNTER — Ambulatory Visit (INDEPENDENT_AMBULATORY_CARE_PROVIDER_SITE_OTHER): Payer: Medicare Other | Admitting: *Deleted

## 2015-12-18 ENCOUNTER — Ambulatory Visit (INDEPENDENT_AMBULATORY_CARE_PROVIDER_SITE_OTHER): Payer: Medicare Other | Admitting: Cardiology

## 2015-12-18 ENCOUNTER — Encounter: Payer: Self-pay | Admitting: Cardiology

## 2015-12-18 VITALS — BP 130/78 | HR 84 | Ht 63.0 in | Wt 168.8 lb

## 2015-12-18 DIAGNOSIS — I482 Chronic atrial fibrillation: Secondary | ICD-10-CM

## 2015-12-18 DIAGNOSIS — I340 Nonrheumatic mitral (valve) insufficiency: Secondary | ICD-10-CM

## 2015-12-18 DIAGNOSIS — I4892 Unspecified atrial flutter: Secondary | ICD-10-CM

## 2015-12-18 DIAGNOSIS — Z7901 Long term (current) use of anticoagulants: Secondary | ICD-10-CM

## 2015-12-18 DIAGNOSIS — I4821 Permanent atrial fibrillation: Secondary | ICD-10-CM

## 2015-12-18 DIAGNOSIS — Z5181 Encounter for therapeutic drug level monitoring: Secondary | ICD-10-CM

## 2015-12-18 LAB — POCT INR: INR: 2.8

## 2015-12-18 NOTE — Progress Notes (Signed)
Patient ID: [name], female   DOB: 03/04/1931, 82 y.o.   MRN: [id number]       [address], [address] [place], [place]  [address] Phone: [phone] Fax:  [phone]  Date:  12/18/2015   ID:  [name], DOB March 27, 1931, MRN [id number]  PCP:  Irven Shelling, MD   History of Present Illness: [name] is a 82 y.o. female with history of atrial flutter, chronic kidney disease here for followup.   She has been admitted in the past with atrial flutter heart rate of 145 beats per minute with rapid ventricular response. Her symptoms were sensation of palpitations, shakiness. No prior cardioversions took place. She has been rate controlled.  At previous visits, I have titrated her metoprolol. Diltiazem also. She takes Bumex as well. Shortness of breath felt to be in part secondary to diastolic heart failure.  10/04/11- Holter - persistent atrial fibrillation/flutter average heart rate 94 beats per minute.  09/2011-Echocardiogram-normal ejection fraction, dilated left atrium, moderate mitral regurgitation  Overall she's doing very well. No complaints. [place], [place]. Had some questions regarding medicines as below.  03/02/13 - Dr. Laurann Montana has changed synthroid, decreased BP meds. Feels so much better. No palpitations, no CP, no SOB unless up hill.   11/28/14-no syncope and change her prior. Right eye cyst is new. She is going to be seeing eye doctor soon. Baseline shortness of breath when walking to mailbox, she has had since she was 82 years old she states. Overall no bleeding, no side effects of medications. Sometimes feels palpitations when laying on left side.  06/12/15-overall doing well, right eye cyst has resolved. Baseline shortness of breath noted. She has had left foot pain/arch pain. This has translated to some back pain. This is decreased her overall activity level. No syncope, no chest pain, no bleeding. She  is doing well with her Coumadin. Insomnia was her main problem.  12/18/15 - Fuji the cat jumped on her shoulder. She has trained her to sit. She is having no chest pain, no sensation of palpitations, no shortness of breath, no sick be, no bleeding. Coumadin at times has been a little challenging control despite diet being steady.  Wt Readings from Last 3 Encounters:  12/18/15 168 lb 12.8 oz (76.6 kg)  06/12/15 185 lb (83.9 kg)  11/28/14 177 lb 6.4 oz (80.5 kg)     Past Medical History:  Diagnosis Date  . Atrial fibrillation ([place])   . Blood transfusion    " no reaction to transfusion"  . Cancer ([place])   . Complication of anesthesia    sensitive to drugs  . Dysrhythmia    atrial flutter  . Headache(784.0)   . Hyperlipidemia   . Hypertension   . Hypothyroidism   . Impaired fasting glucose   . Kidney disease, chronic, stage IV (GFR 15-29 ml/min) (HCC)   . 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 Macular degeneration   . Migraine headache    Improved after menopause  . Mitral regurgitation 11/27/2012  . Renal disorder   . Shortness of breath   . Shortness of breath    Cardiology Eval., possible diastolic dysfunctio  . UTI (urinary tract infection)    Frequent  . Visual loss, right eye    Left ocular accident  . Vitamin D deficiency   . Wrist fracture, right 12/2009    Past Surgical  History:  Procedure Laterality Date  . BLEPHAROPLASTY    . Cataract Surgery Bilateral   . NERVE BIOPSY    . TONSILLECTOMY    . TOTAL ABDOMINAL HYSTERECTOMY W/ BILATERAL SALPINGOOPHORECTOMY  01/2012   , Partial resection of the sigmoid colon    Current Outpatient Prescriptions  Medication Sig Dispense Refill  . Cholecalciferol (VITAMIN D) 2000 UNITS tablet Take 2,000 Units by mouth daily.    . ciprofloxacin (CIPRO) 250 MG tablet Take 250 mg by mouth 2 (two) times daily as needed (for cystitis symptoms). For symptoms    . diltiazem (CARDIZEM CD) 180 MG  24 hr capsule TAKE 1 CAPSULE IN THE MORNING ON AN EMPTY STOMACH. 30 capsule 11  . levothyroxine (SYNTHROID, LEVOTHROID) 75 MCG tablet Take 75 mcg by mouth every other day.     . lisinopril (PRINIVIL,ZESTRIL) 10 MG tablet Take 10 mg by mouth daily.    Marland Kitchen loratadine (CLARITIN) 10 MG tablet Take 10 mg by mouth daily as needed for allergies.     . metoprolol succinate (TOPROL-XL) 100 MG 24 hr tablet Take 1 tablet (100 mg total) by mouth daily. Take with or immediately following a meal. 30 tablet 11  . potassium chloride SA (K-DUR,KLOR-CON) 20 MEQ tablet Take 20 mEq by mouth daily.     . Probiotic Product (ALIGN PO) Take 1 capsule by mouth daily.    Marland Kitchen SYNTHROID 50 MCG tablet Take 50 mcg by mouth every other day.     . torsemide (DEMADEX) 20 MG tablet Take 20 mg by mouth every evening.     . warfarin (COUMADIN) 3 MG tablet TAKE AS DIRECTED BY COUMADIN CLINIC. 45 tablet 3   No current facility-administered medications for this visit.     Allergies:    Allergies  Allergen Reactions  . Plendil [Felodipine]   . Aspirin     Stomach upset  . Barbiturates     Poorly tolerated  . Eggs Or Egg-Derived Products     diarrhea  . Epinephrine     Fever   . Erythromycin Base Nausea And Vomiting    All mycins  . Felodipine Er     headache  . Iodinated Diagnostic Agents     Kidney faiilure  . Iodine Other (See Comments)    Kidneys shut down  . Morphine And Related     Rash   . Oxycodone Hcl     Heart race  . Penicillins     Fever   . Pravachol     Muscle ache  . Soy Allergy     shaking  . Sulfa Antibiotics     rash  . Wheat     Stomach upset  . Adhesive [Tape] Rash    Rash     Social History:  The patient  reports that she has never smoked. She has never used smokeless tobacco. She reports that she does not drink alcohol or use drugs.   ROS:  Please see the history of present illness.   No bleeding, no syncope, no strokelike symptoms, no chest pain.   All other systems reviewed and  negative.   PHYSICAL EXAM: VS:  BP 130/78   Pulse 84   Ht 5\' 3"  (1.6 m)   Wt 168 lb 12.8 oz (76.6 kg)   BMI 29.90 kg/m  Well nourished, well developed, in no acute distress  HEENT: normal  Neck: no JVD  Cardiac:  Mildly irreg. S1, S2; normal rate; 1/6 Systolic murmur apex  Lungs:  clear to auscultation bilaterally, no wheezing, rhonchi or rales  Abd: soft, nontender, no hepatomegaly obesity Ext: no pitting edema, chronic subcutaneous tissue bilateral lower extremities  Skin: warm and dry  Neuro: no focal abnormalities noted   EKG: Today 12/18/15-atrial fibrillation 84 with no other significant abnormalities. 11/28/14- atrial fibrillation heart rate 78 with borderline low voltage, no significant change from prior personally viewed-Atrial fibrillation, 83, no changes from prior. 08/31/13-atrial fibrillation rate 85, nonspecific ST-T wave changes, no significant change from prior. Heart rate 85  Echo: 2014-normal EF, moderate mitral regurgitation  LABS: 06/12/15: INR 2.6  ASSESSMENT AND PLAN:  82 year old with persistent atrial fibrillation/flutter currently rate controlled with chronic anticoagulation, chronic kidney disease stage IV.   1. Atrial fibrillation/flutter permanent-continue with good rate control. On both metoprolol as well as diltiazem. Medications reviewed. Feels great. Currently good rate control. 2. Chronic kidney disease stage IV-avoid NSAIDs. Currently on ACE inhibitor. Dr. Laurann Montana is her primary.  3. Insomnia- Dr. Laurann Montana. She has a paradoxical response to Benadryl. Does not like the way melatonin makes her feel. 4. Chronic anticoagulation-continue to monitor Coumadin closely. No bleeding. She has her Coumadin checked here.  5. Mild LVH/moderate mitral regurgitation-no change in symptoms. Continue to monitor. Soft murmur. Has some baseline shortness of breath. Weight loss- Continuing with torsemide. Previous water activity. Doing well.   7-month  followup   Signed, Candee Furbish, MD [facility]  12/18/2015 9:11 AM

## 2015-12-18 NOTE — Patient Instructions (Signed)

## 2016-01-15 ENCOUNTER — Ambulatory Visit (INDEPENDENT_AMBULATORY_CARE_PROVIDER_SITE_OTHER): Payer: Medicare Other | Admitting: *Deleted

## 2016-01-15 DIAGNOSIS — I4892 Unspecified atrial flutter: Secondary | ICD-10-CM

## 2016-01-15 DIAGNOSIS — Z5181 Encounter for therapeutic drug level monitoring: Secondary | ICD-10-CM | POA: Diagnosis not present

## 2016-01-15 LAB — POCT INR: INR: 3

## 2016-02-12 ENCOUNTER — Ambulatory Visit (INDEPENDENT_AMBULATORY_CARE_PROVIDER_SITE_OTHER): Payer: Medicare Other | Admitting: *Deleted

## 2016-02-12 DIAGNOSIS — I4892 Unspecified atrial flutter: Secondary | ICD-10-CM

## 2016-02-12 DIAGNOSIS — Z5181 Encounter for therapeutic drug level monitoring: Secondary | ICD-10-CM | POA: Diagnosis not present

## 2016-02-12 LAB — POCT INR: INR: 2.9

## 2016-02-28 DIAGNOSIS — E669 Obesity, unspecified: Secondary | ICD-10-CM | POA: Diagnosis not present

## 2016-02-28 DIAGNOSIS — E559 Vitamin D deficiency, unspecified: Secondary | ICD-10-CM | POA: Diagnosis not present

## 2016-02-28 DIAGNOSIS — Z1389 Encounter for screening for other disorder: Secondary | ICD-10-CM | POA: Diagnosis not present

## 2016-02-28 DIAGNOSIS — I129 Hypertensive chronic kidney disease with stage 1 through stage 4 chronic kidney disease, or unspecified chronic kidney disease: Secondary | ICD-10-CM | POA: Diagnosis not present

## 2016-02-28 DIAGNOSIS — E039 Hypothyroidism, unspecified: Secondary | ICD-10-CM | POA: Diagnosis not present

## 2016-02-28 DIAGNOSIS — Z0000 Encounter for general adult medical examination without abnormal findings: Secondary | ICD-10-CM | POA: Diagnosis not present

## 2016-02-28 DIAGNOSIS — N184 Chronic kidney disease, stage 4 (severe): Secondary | ICD-10-CM | POA: Diagnosis not present

## 2016-02-28 DIAGNOSIS — I481 Persistent atrial fibrillation: Secondary | ICD-10-CM | POA: Diagnosis not present

## 2016-02-28 DIAGNOSIS — I5032 Chronic diastolic (congestive) heart failure: Secondary | ICD-10-CM | POA: Diagnosis not present

## 2016-02-28 DIAGNOSIS — R7301 Impaired fasting glucose: Secondary | ICD-10-CM | POA: Diagnosis not present

## 2016-02-28 DIAGNOSIS — Z6831 Body mass index (BMI) 31.0-31.9, adult: Secondary | ICD-10-CM | POA: Diagnosis not present

## 2016-03-18 ENCOUNTER — Ambulatory Visit (INDEPENDENT_AMBULATORY_CARE_PROVIDER_SITE_OTHER): Payer: Medicare Other

## 2016-03-18 DIAGNOSIS — Z5181 Encounter for therapeutic drug level monitoring: Secondary | ICD-10-CM | POA: Diagnosis not present

## 2016-03-18 DIAGNOSIS — I4892 Unspecified atrial flutter: Secondary | ICD-10-CM

## 2016-03-18 LAB — POCT INR: INR: 2.7

## 2016-03-25 ENCOUNTER — Encounter: Payer: Self-pay | Admitting: Podiatry

## 2016-03-25 ENCOUNTER — Ambulatory Visit (INDEPENDENT_AMBULATORY_CARE_PROVIDER_SITE_OTHER): Payer: Medicare Other | Admitting: Podiatry

## 2016-03-25 ENCOUNTER — Ambulatory Visit (INDEPENDENT_AMBULATORY_CARE_PROVIDER_SITE_OTHER): Payer: Medicare Other

## 2016-03-25 VITALS — BP 132/82 | HR 87 | Resp 14 | Ht 63.0 in | Wt 165.0 lb

## 2016-03-25 DIAGNOSIS — M21069 Valgus deformity, not elsewhere classified, unspecified knee: Secondary | ICD-10-CM

## 2016-03-25 DIAGNOSIS — M2140 Flat foot [pes planus] (acquired), unspecified foot: Secondary | ICD-10-CM

## 2016-03-25 NOTE — Progress Notes (Signed)
   Subjective:    Patient ID: [name], female    DOB: 1931/12/03, 83 y.o.   MRN: [id number]  HPI  [name] presents to the office today for concerns of feet rolling in which she believes is causing her knees to hurt. She has been trying to work out but her arch completely falls and she feels that she needs an orthotic to "build it up". She denies any foot pain and she denies any swelling. No numbness or tingling. No other complaints at this time. This has been ongoing for 2-3 years.   Review of Systems  Musculoskeletal:        Muscle pain  Hematological: Bruises/bleeds easily.       Objective:   Physical Exam General: AAO x3, NAD  Dermatological: Skin is warm, dry and supple bilateral. Nails x 10 are well manicured; remaining integument appears unremarkable at this time. There are no open sores, no preulcerative lesions, no rash or signs of infection present.  Vascular: Dorsalis Pedis artery and Posterior Tibial artery pedal pulses are 2/4 bilateral with immedate capillary fill time. There is no pain with calf compression, swelling, warmth, erythema.   Neruologic: Grossly intact via light touch bilateral. Vibratory intact via tuning fork bilateral. Protective threshold with Semmes Wienstein monofilament intact to all pedal sites bilateral.   Musculoskeletal: There is a thickened decrease in medial arch height. There is no areas of tenderness to bilateral lower extremities. There is genu valgum present. No gross boney pedal deformities bilateral. No pain, crepitus, or limitation noted with foot and ankle range of motion bilateral. Muscular strength 5/5 in all groups tested bilateral.  Gait: Unassisted, Nonantalgic.      Assessment & Plan:  Bilateral flatfoot -Treatment options discussed including all alternatives, risks, and complications -Etiology of symptoms were discussed -X-rays were obtained and reviewed with the patient. No evidence of acute fracture -She is  requesting orthotics. She was scanned for orthotics today and they were sent to [facility] labs. She may ultimately need a brace.  -Follow-up in 3 weeks to [facility] or sooner if any problems arise. In the meantime, encouraged to call the office with any questions, concerns, change in symptoms.   Celesta Gentile, DPm

## 2016-04-05 ENCOUNTER — Telehealth: Payer: Self-pay | Admitting: Podiatry

## 2016-04-05 NOTE — Telephone Encounter (Signed)
Pt called to let you know she got her orthotics at her home yesterday afternoon. She said they feel good when she put them in her shoes and will call to schedule an appointment in about 4 weeks like she was told to do. She has to get transportation lined up.

## 2016-04-29 ENCOUNTER — Encounter (INDEPENDENT_AMBULATORY_CARE_PROVIDER_SITE_OTHER): Payer: Self-pay

## 2016-04-29 ENCOUNTER — Ambulatory Visit (INDEPENDENT_AMBULATORY_CARE_PROVIDER_SITE_OTHER): Payer: Medicare Other | Admitting: *Deleted

## 2016-04-29 DIAGNOSIS — Z5181 Encounter for therapeutic drug level monitoring: Secondary | ICD-10-CM

## 2016-04-29 DIAGNOSIS — I4892 Unspecified atrial flutter: Secondary | ICD-10-CM

## 2016-04-29 LAB — POCT INR: INR: 2.3

## 2016-05-24 ENCOUNTER — Other Ambulatory Visit: Payer: Self-pay | Admitting: Cardiology

## 2016-06-06 DIAGNOSIS — D2339 Other benign neoplasm of skin of other parts of face: Secondary | ICD-10-CM | POA: Diagnosis not present

## 2016-06-06 DIAGNOSIS — M6748 Ganglion, other site: Secondary | ICD-10-CM | POA: Diagnosis not present

## 2016-06-06 DIAGNOSIS — L219 Seborrheic dermatitis, unspecified: Secondary | ICD-10-CM | POA: Diagnosis not present

## 2016-06-14 ENCOUNTER — Ambulatory Visit (INDEPENDENT_AMBULATORY_CARE_PROVIDER_SITE_OTHER): Payer: Medicare Other | Admitting: *Deleted

## 2016-06-14 DIAGNOSIS — Z5181 Encounter for therapeutic drug level monitoring: Secondary | ICD-10-CM

## 2016-06-14 DIAGNOSIS — I4892 Unspecified atrial flutter: Secondary | ICD-10-CM | POA: Diagnosis not present

## 2016-06-14 LAB — POCT INR: INR: 2.2

## 2016-06-21 ENCOUNTER — Other Ambulatory Visit: Payer: Self-pay | Admitting: Cardiology

## 2016-07-09 ENCOUNTER — Ambulatory Visit (INDEPENDENT_AMBULATORY_CARE_PROVIDER_SITE_OTHER): Payer: Medicare Other | Admitting: Podiatry

## 2016-07-09 ENCOUNTER — Encounter: Payer: Self-pay | Admitting: Podiatry

## 2016-07-09 DIAGNOSIS — M21069 Valgus deformity, not elsewhere classified, unspecified knee: Secondary | ICD-10-CM

## 2016-07-09 DIAGNOSIS — M2140 Flat foot [pes planus] (acquired), unspecified foot: Secondary | ICD-10-CM | POA: Diagnosis not present

## 2016-07-09 NOTE — Progress Notes (Signed)
Subjective: 83 year old female presents the office today after getting answers patient states that at first the answers were comfortable but she feels that she is having more arch support the arch and rebuilt up on the left side more than the right. She denies any pain to her feet and she denies any swelling or redness. She states that the inserts have helped her niece that she feels that she is more support. Denies any systemic complaints such as fevers, chills, nausea, vomiting. No acute changes since last appointment, and no other complaints at this time.   Objective: AAO x3, NAD DP/PT pulses palpable bilaterally, CRT less than 3 seconds There is no area of tenderness bilateral lower extremities. Decrease in medial arch height upon weightbearing. Any valgus present. She is now walking with a walker which is helping. There is no significant edema, erythema, increase in warmth. No open lesions or pre-ulcerative lesions.  No pain with calf compression, swelling, warmth, erythema  Assessment: Flatfoot  Plan: -All treatment options discussed with the patient including all alternatives, risks, complications.  -Discussed stretching exercises for the Achilles. She will that she could do more with the arch support. We've and a make her inserts to add more arch support to see if this will help more and we will also try a different style of insert.  -Will mail the inserts to her once they come in . -Patient encouraged to call the office with any questions, concerns, change in symptoms.   Celesta Gentile, DPM

## 2016-07-11 DIAGNOSIS — L814 Other melanin hyperpigmentation: Secondary | ICD-10-CM | POA: Diagnosis not present

## 2016-07-11 DIAGNOSIS — D485 Neoplasm of uncertain behavior of skin: Secondary | ICD-10-CM | POA: Diagnosis not present

## 2016-07-12 ENCOUNTER — Ambulatory Visit (INDEPENDENT_AMBULATORY_CARE_PROVIDER_SITE_OTHER): Payer: Medicare Other | Admitting: Pharmacist

## 2016-07-12 DIAGNOSIS — I4892 Unspecified atrial flutter: Secondary | ICD-10-CM

## 2016-07-12 DIAGNOSIS — Z5181 Encounter for therapeutic drug level monitoring: Secondary | ICD-10-CM | POA: Diagnosis not present

## 2016-07-12 LAB — POCT INR: INR: 2.1

## 2016-08-26 ENCOUNTER — Ambulatory Visit (INDEPENDENT_AMBULATORY_CARE_PROVIDER_SITE_OTHER): Payer: Medicare Other | Admitting: *Deleted

## 2016-08-26 DIAGNOSIS — Z5181 Encounter for therapeutic drug level monitoring: Secondary | ICD-10-CM | POA: Diagnosis not present

## 2016-08-26 DIAGNOSIS — I4892 Unspecified atrial flutter: Secondary | ICD-10-CM

## 2016-08-26 LAB — POCT INR: INR: 3.2

## 2016-09-03 DIAGNOSIS — R269 Unspecified abnormalities of gait and mobility: Secondary | ICD-10-CM | POA: Diagnosis not present

## 2016-09-03 DIAGNOSIS — N184 Chronic kidney disease, stage 4 (severe): Secondary | ICD-10-CM | POA: Diagnosis not present

## 2016-09-03 DIAGNOSIS — M810 Age-related osteoporosis without current pathological fracture: Secondary | ICD-10-CM | POA: Diagnosis not present

## 2016-09-03 DIAGNOSIS — I129 Hypertensive chronic kidney disease with stage 1 through stage 4 chronic kidney disease, or unspecified chronic kidney disease: Secondary | ICD-10-CM | POA: Diagnosis not present

## 2016-09-04 ENCOUNTER — Other Ambulatory Visit: Payer: Self-pay | Admitting: Internal Medicine

## 2016-09-04 DIAGNOSIS — M810 Age-related osteoporosis without current pathological fracture: Secondary | ICD-10-CM | POA: Diagnosis not present

## 2016-09-04 DIAGNOSIS — Z1231 Encounter for screening mammogram for malignant neoplasm of breast: Secondary | ICD-10-CM

## 2016-09-04 DIAGNOSIS — N184 Chronic kidney disease, stage 4 (severe): Secondary | ICD-10-CM | POA: Diagnosis not present

## 2016-09-04 DIAGNOSIS — I130 Hypertensive heart and chronic kidney disease with heart failure and stage 1 through stage 4 chronic kidney disease, or unspecified chronic kidney disease: Secondary | ICD-10-CM | POA: Diagnosis not present

## 2016-09-04 DIAGNOSIS — I5032 Chronic diastolic (congestive) heart failure: Secondary | ICD-10-CM | POA: Diagnosis not present

## 2016-09-04 DIAGNOSIS — I482 Chronic atrial fibrillation: Secondary | ICD-10-CM | POA: Diagnosis not present

## 2016-09-04 DIAGNOSIS — R269 Unspecified abnormalities of gait and mobility: Secondary | ICD-10-CM | POA: Diagnosis not present

## 2016-09-06 DIAGNOSIS — N184 Chronic kidney disease, stage 4 (severe): Secondary | ICD-10-CM | POA: Diagnosis not present

## 2016-09-06 DIAGNOSIS — M810 Age-related osteoporosis without current pathological fracture: Secondary | ICD-10-CM | POA: Diagnosis not present

## 2016-09-06 DIAGNOSIS — R269 Unspecified abnormalities of gait and mobility: Secondary | ICD-10-CM | POA: Diagnosis not present

## 2016-09-06 DIAGNOSIS — I482 Chronic atrial fibrillation: Secondary | ICD-10-CM | POA: Diagnosis not present

## 2016-09-06 DIAGNOSIS — I130 Hypertensive heart and chronic kidney disease with heart failure and stage 1 through stage 4 chronic kidney disease, or unspecified chronic kidney disease: Secondary | ICD-10-CM | POA: Diagnosis not present

## 2016-09-06 DIAGNOSIS — I5032 Chronic diastolic (congestive) heart failure: Secondary | ICD-10-CM | POA: Diagnosis not present

## 2016-09-13 DIAGNOSIS — I482 Chronic atrial fibrillation: Secondary | ICD-10-CM | POA: Diagnosis not present

## 2016-09-13 DIAGNOSIS — R269 Unspecified abnormalities of gait and mobility: Secondary | ICD-10-CM | POA: Diagnosis not present

## 2016-09-13 DIAGNOSIS — I5032 Chronic diastolic (congestive) heart failure: Secondary | ICD-10-CM | POA: Diagnosis not present

## 2016-09-13 DIAGNOSIS — I130 Hypertensive heart and chronic kidney disease with heart failure and stage 1 through stage 4 chronic kidney disease, or unspecified chronic kidney disease: Secondary | ICD-10-CM | POA: Diagnosis not present

## 2016-09-13 DIAGNOSIS — M810 Age-related osteoporosis without current pathological fracture: Secondary | ICD-10-CM | POA: Diagnosis not present

## 2016-09-13 DIAGNOSIS — N184 Chronic kidney disease, stage 4 (severe): Secondary | ICD-10-CM | POA: Diagnosis not present

## 2016-09-18 DIAGNOSIS — R269 Unspecified abnormalities of gait and mobility: Secondary | ICD-10-CM | POA: Diagnosis not present

## 2016-09-18 DIAGNOSIS — N184 Chronic kidney disease, stage 4 (severe): Secondary | ICD-10-CM | POA: Diagnosis not present

## 2016-09-18 DIAGNOSIS — I482 Chronic atrial fibrillation: Secondary | ICD-10-CM | POA: Diagnosis not present

## 2016-09-18 DIAGNOSIS — I130 Hypertensive heart and chronic kidney disease with heart failure and stage 1 through stage 4 chronic kidney disease, or unspecified chronic kidney disease: Secondary | ICD-10-CM | POA: Diagnosis not present

## 2016-09-18 DIAGNOSIS — M810 Age-related osteoporosis without current pathological fracture: Secondary | ICD-10-CM | POA: Diagnosis not present

## 2016-09-18 DIAGNOSIS — I5032 Chronic diastolic (congestive) heart failure: Secondary | ICD-10-CM | POA: Diagnosis not present

## 2016-09-20 DIAGNOSIS — N184 Chronic kidney disease, stage 4 (severe): Secondary | ICD-10-CM | POA: Diagnosis not present

## 2016-09-20 DIAGNOSIS — I130 Hypertensive heart and chronic kidney disease with heart failure and stage 1 through stage 4 chronic kidney disease, or unspecified chronic kidney disease: Secondary | ICD-10-CM | POA: Diagnosis not present

## 2016-09-20 DIAGNOSIS — I482 Chronic atrial fibrillation: Secondary | ICD-10-CM | POA: Diagnosis not present

## 2016-09-20 DIAGNOSIS — M810 Age-related osteoporosis without current pathological fracture: Secondary | ICD-10-CM | POA: Diagnosis not present

## 2016-09-20 DIAGNOSIS — I5032 Chronic diastolic (congestive) heart failure: Secondary | ICD-10-CM | POA: Diagnosis not present

## 2016-09-20 DIAGNOSIS — R269 Unspecified abnormalities of gait and mobility: Secondary | ICD-10-CM | POA: Diagnosis not present

## 2016-09-23 ENCOUNTER — Ambulatory Visit (INDEPENDENT_AMBULATORY_CARE_PROVIDER_SITE_OTHER): Payer: Medicare Other | Admitting: *Deleted

## 2016-09-23 DIAGNOSIS — Z5181 Encounter for therapeutic drug level monitoring: Secondary | ICD-10-CM

## 2016-09-23 DIAGNOSIS — I4892 Unspecified atrial flutter: Secondary | ICD-10-CM | POA: Diagnosis not present

## 2016-09-23 LAB — POCT INR: INR: 2.4

## 2016-09-27 DIAGNOSIS — I5032 Chronic diastolic (congestive) heart failure: Secondary | ICD-10-CM | POA: Diagnosis not present

## 2016-09-27 DIAGNOSIS — R269 Unspecified abnormalities of gait and mobility: Secondary | ICD-10-CM | POA: Diagnosis not present

## 2016-09-27 DIAGNOSIS — N184 Chronic kidney disease, stage 4 (severe): Secondary | ICD-10-CM | POA: Diagnosis not present

## 2016-09-27 DIAGNOSIS — M810 Age-related osteoporosis without current pathological fracture: Secondary | ICD-10-CM | POA: Diagnosis not present

## 2016-09-27 DIAGNOSIS — I482 Chronic atrial fibrillation: Secondary | ICD-10-CM | POA: Diagnosis not present

## 2016-09-27 DIAGNOSIS — I130 Hypertensive heart and chronic kidney disease with heart failure and stage 1 through stage 4 chronic kidney disease, or unspecified chronic kidney disease: Secondary | ICD-10-CM | POA: Diagnosis not present

## 2016-10-02 DIAGNOSIS — I482 Chronic atrial fibrillation: Secondary | ICD-10-CM | POA: Diagnosis not present

## 2016-10-02 DIAGNOSIS — M810 Age-related osteoporosis without current pathological fracture: Secondary | ICD-10-CM | POA: Diagnosis not present

## 2016-10-02 DIAGNOSIS — R269 Unspecified abnormalities of gait and mobility: Secondary | ICD-10-CM | POA: Diagnosis not present

## 2016-10-02 DIAGNOSIS — I5032 Chronic diastolic (congestive) heart failure: Secondary | ICD-10-CM | POA: Diagnosis not present

## 2016-10-02 DIAGNOSIS — I130 Hypertensive heart and chronic kidney disease with heart failure and stage 1 through stage 4 chronic kidney disease, or unspecified chronic kidney disease: Secondary | ICD-10-CM | POA: Diagnosis not present

## 2016-10-02 DIAGNOSIS — N184 Chronic kidney disease, stage 4 (severe): Secondary | ICD-10-CM | POA: Diagnosis not present

## 2016-10-03 DIAGNOSIS — H43813 Vitreous degeneration, bilateral: Secondary | ICD-10-CM | POA: Diagnosis not present

## 2016-10-03 DIAGNOSIS — H353222 Exudative age-related macular degeneration, left eye, with inactive choroidal neovascularization: Secondary | ICD-10-CM | POA: Diagnosis not present

## 2016-10-03 DIAGNOSIS — H353212 Exudative age-related macular degeneration, right eye, with inactive choroidal neovascularization: Secondary | ICD-10-CM | POA: Diagnosis not present

## 2016-10-03 DIAGNOSIS — H353232 Exudative age-related macular degeneration, bilateral, with inactive choroidal neovascularization: Secondary | ICD-10-CM | POA: Diagnosis not present

## 2016-10-03 DIAGNOSIS — Z961 Presence of intraocular lens: Secondary | ICD-10-CM | POA: Diagnosis not present

## 2016-10-04 DIAGNOSIS — R269 Unspecified abnormalities of gait and mobility: Secondary | ICD-10-CM | POA: Diagnosis not present

## 2016-10-04 DIAGNOSIS — I482 Chronic atrial fibrillation: Secondary | ICD-10-CM | POA: Diagnosis not present

## 2016-10-04 DIAGNOSIS — N184 Chronic kidney disease, stage 4 (severe): Secondary | ICD-10-CM | POA: Diagnosis not present

## 2016-10-04 DIAGNOSIS — M810 Age-related osteoporosis without current pathological fracture: Secondary | ICD-10-CM | POA: Diagnosis not present

## 2016-10-04 DIAGNOSIS — I130 Hypertensive heart and chronic kidney disease with heart failure and stage 1 through stage 4 chronic kidney disease, or unspecified chronic kidney disease: Secondary | ICD-10-CM | POA: Diagnosis not present

## 2016-10-04 DIAGNOSIS — I5032 Chronic diastolic (congestive) heart failure: Secondary | ICD-10-CM | POA: Diagnosis not present

## 2016-10-09 DIAGNOSIS — M810 Age-related osteoporosis without current pathological fracture: Secondary | ICD-10-CM | POA: Diagnosis not present

## 2016-10-09 DIAGNOSIS — I482 Chronic atrial fibrillation: Secondary | ICD-10-CM | POA: Diagnosis not present

## 2016-10-09 DIAGNOSIS — I5032 Chronic diastolic (congestive) heart failure: Secondary | ICD-10-CM | POA: Diagnosis not present

## 2016-10-09 DIAGNOSIS — N184 Chronic kidney disease, stage 4 (severe): Secondary | ICD-10-CM | POA: Diagnosis not present

## 2016-10-09 DIAGNOSIS — R269 Unspecified abnormalities of gait and mobility: Secondary | ICD-10-CM | POA: Diagnosis not present

## 2016-10-09 DIAGNOSIS — I130 Hypertensive heart and chronic kidney disease with heart failure and stage 1 through stage 4 chronic kidney disease, or unspecified chronic kidney disease: Secondary | ICD-10-CM | POA: Diagnosis not present

## 2016-10-10 DIAGNOSIS — R269 Unspecified abnormalities of gait and mobility: Secondary | ICD-10-CM | POA: Diagnosis not present

## 2016-10-10 DIAGNOSIS — I482 Chronic atrial fibrillation: Secondary | ICD-10-CM | POA: Diagnosis not present

## 2016-10-10 DIAGNOSIS — I5032 Chronic diastolic (congestive) heart failure: Secondary | ICD-10-CM | POA: Diagnosis not present

## 2016-10-10 DIAGNOSIS — N184 Chronic kidney disease, stage 4 (severe): Secondary | ICD-10-CM | POA: Diagnosis not present

## 2016-10-10 DIAGNOSIS — M810 Age-related osteoporosis without current pathological fracture: Secondary | ICD-10-CM | POA: Diagnosis not present

## 2016-10-10 DIAGNOSIS — I130 Hypertensive heart and chronic kidney disease with heart failure and stage 1 through stage 4 chronic kidney disease, or unspecified chronic kidney disease: Secondary | ICD-10-CM | POA: Diagnosis not present

## 2016-10-20 ENCOUNTER — Other Ambulatory Visit: Payer: Self-pay | Admitting: Cardiology

## 2016-10-21 ENCOUNTER — Ambulatory Visit
Admission: RE | Admit: 2016-10-21 | Discharge: 2016-10-21 | Disposition: A | Discharge status: Home / Self Care | Payer: Medicare Other | Source: Ambulatory Visit | Attending: Internal Medicine | Admitting: Internal Medicine

## 2016-10-21 ENCOUNTER — Ambulatory Visit (INDEPENDENT_AMBULATORY_CARE_PROVIDER_SITE_OTHER): Payer: Medicare Other | Admitting: *Deleted

## 2016-10-21 DIAGNOSIS — I4892 Unspecified atrial flutter: Secondary | ICD-10-CM

## 2016-10-21 DIAGNOSIS — Z5181 Encounter for therapeutic drug level monitoring: Secondary | ICD-10-CM

## 2016-10-21 DIAGNOSIS — Z1231 Encounter for screening mammogram for malignant neoplasm of breast: Secondary | ICD-10-CM | POA: Diagnosis not present

## 2016-10-21 LAB — POCT INR: INR: 3.1

## 2016-11-14 ENCOUNTER — Ambulatory Visit (INDEPENDENT_AMBULATORY_CARE_PROVIDER_SITE_OTHER): Payer: Medicare Other | Admitting: *Deleted

## 2016-11-14 DIAGNOSIS — I4892 Unspecified atrial flutter: Secondary | ICD-10-CM | POA: Diagnosis not present

## 2016-11-14 DIAGNOSIS — L218 Other seborrheic dermatitis: Secondary | ICD-10-CM | POA: Diagnosis not present

## 2016-11-14 DIAGNOSIS — Z5181 Encounter for therapeutic drug level monitoring: Secondary | ICD-10-CM

## 2016-11-14 DIAGNOSIS — D2271 Melanocytic nevi of right lower limb, including hip: Secondary | ICD-10-CM | POA: Diagnosis not present

## 2016-11-14 DIAGNOSIS — Z23 Encounter for immunization: Secondary | ICD-10-CM | POA: Diagnosis not present

## 2016-11-14 LAB — POCT INR: INR: 2.8

## 2016-11-19 DIAGNOSIS — E7800 Pure hypercholesterolemia, unspecified: Secondary | ICD-10-CM | POA: Diagnosis not present

## 2016-11-19 DIAGNOSIS — I129 Hypertensive chronic kidney disease with stage 1 through stage 4 chronic kidney disease, or unspecified chronic kidney disease: Secondary | ICD-10-CM | POA: Diagnosis not present

## 2016-11-19 DIAGNOSIS — I5032 Chronic diastolic (congestive) heart failure: Secondary | ICD-10-CM | POA: Diagnosis not present

## 2016-11-19 DIAGNOSIS — I481 Persistent atrial fibrillation: Secondary | ICD-10-CM | POA: Diagnosis not present

## 2016-11-19 DIAGNOSIS — E039 Hypothyroidism, unspecified: Secondary | ICD-10-CM | POA: Diagnosis not present

## 2016-11-19 DIAGNOSIS — N184 Chronic kidney disease, stage 4 (severe): Secondary | ICD-10-CM | POA: Diagnosis not present

## 2016-11-19 DIAGNOSIS — M810 Age-related osteoporosis without current pathological fracture: Secondary | ICD-10-CM | POA: Diagnosis not present

## 2016-12-20 ENCOUNTER — Encounter: Payer: Self-pay | Admitting: Cardiology

## 2016-12-20 ENCOUNTER — Ambulatory Visit (INDEPENDENT_AMBULATORY_CARE_PROVIDER_SITE_OTHER): Payer: Medicare Other | Admitting: Cardiology

## 2016-12-20 ENCOUNTER — Encounter (INDEPENDENT_AMBULATORY_CARE_PROVIDER_SITE_OTHER): Payer: Self-pay

## 2016-12-20 ENCOUNTER — Other Ambulatory Visit: Payer: Self-pay | Admitting: Cardiology

## 2016-12-20 ENCOUNTER — Ambulatory Visit (INDEPENDENT_AMBULATORY_CARE_PROVIDER_SITE_OTHER): Payer: Medicare Other | Admitting: *Deleted

## 2016-12-20 VITALS — BP 146/86 | HR 79 | Ht 63.0 in | Wt 161.8 lb

## 2016-12-20 DIAGNOSIS — Z5181 Encounter for therapeutic drug level monitoring: Secondary | ICD-10-CM

## 2016-12-20 DIAGNOSIS — N184 Chronic kidney disease, stage 4 (severe): Secondary | ICD-10-CM

## 2016-12-20 DIAGNOSIS — I4892 Unspecified atrial flutter: Secondary | ICD-10-CM | POA: Diagnosis not present

## 2016-12-20 DIAGNOSIS — I340 Nonrheumatic mitral (valve) insufficiency: Secondary | ICD-10-CM | POA: Diagnosis not present

## 2016-12-20 DIAGNOSIS — Z7901 Long term (current) use of anticoagulants: Secondary | ICD-10-CM | POA: Diagnosis not present

## 2016-12-20 LAB — POCT INR: INR: 3.8

## 2016-12-20 NOTE — Patient Instructions (Signed)

## 2016-12-20 NOTE — Progress Notes (Signed)
Patient ID: [name], female   DOB: 1931/12/06, 83 y.o.   MRN: [id number]       [address], [address], [place]  [address] Phone: [phone] Fax:  [phone]  Date:  12/20/2016   ID:  [name], [place] [birth year], MRN [id number]  PCP:  Tina Orn, MD   History of Present Illness: [name] is a 83 y.o. female with history of atrial flutter, chronic kidney disease here for followup.   She was admitted in 2013 with atrial flutter heart rate of 145 beats per minute with rapid ventricular response. Her symptoms were sensation of palpitations, shakiness. No prior cardioversions took place. She has been rate controlled.  At previous visits, I have titrated her metoprolol. Diltiazem also. She takes Bumex as well. Shortness of breath felt to be in part secondary to diastolic heart failure.  10/04/11- Holter - persistent atrial fibrillation/flutter average heart rate 94 beats per minute.  09/2011-Echocardiogram-normal ejection fraction, dilated left atrium, moderate mitral regurgitation  03/02/13 - Dr. Laurann [name] has changed synthroid, decreased BP meds. Feels so much better. No palpitations, no CP, no SOB unless up hill.   11/28/14-no syncope and change her prior. Right eye cyst is new. She is going to be seeing eye doctor soon. Baseline shortness of breath when walking to mailbox, she has had since she was 83 years old she states. Overall no bleeding, no side effects of medications. Sometimes feels palpitations when laying on left side.  06/12/15-overall doing well, right eye cyst has resolved. Baseline shortness of breath noted. She has had left foot pain/arch pain. This has translated to some back pain. This is decreased her overall activity level. No syncope, no chest pain, no bleeding. She is doing well with her Coumadin. Insomnia was her main problem.  12/18/15 - Fuji the cat jumped on her shoulder. She has trained her to sit. She is having no chest pain, no  sensation of palpitations, no shortness of breath, no syncope, no bleeding. Coumadin at times has been a little challenging control despite diet being steady.  12/20/16 - insomnia.  This has been a major issue for her/main issue for her.  She also has chronic left lower extremity edema.  Sees podiatrist.  No chest pain, no shortness of breath, no fevers, no syncope.  She is legally blind with macular degeneration.  Wt Readings from Last 3 Encounters:  12/20/16 161 lb 12.8 oz (73.4 kg)  03/25/16 165 lb (74.8 kg)  12/18/15 168 lb 12.8 oz (76.6 kg)     Past Medical History:  Diagnosis Date  . Atrial fibrillation ([place])   . Blood transfusion    " no reaction to transfusion"  . Cancer ([place])   . Complication of anesthesia    sensitive to drugs  . Dysrhythmia    atrial flutter  . Headache(784.0)   . Hyperlipidemia   . Hypertension   . Hypothyroidism   . Impaired fasting glucose   . Kidney disease, chronic, stage IV (GFR 15-29 ml/min) (HCC)   . 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 Macular degeneration   . Migraine headache    Improved after menopause  . Mitral regurgitation 11/27/2012  . Renal disorder   . Shortness of breath   . Shortness of breath    Cardiology Eval., possible diastolic dysfunctio  . UTI (urinary tract infection)    Frequent  . Visual loss, right eye    Left ocular  accident  . Vitamin D deficiency   . Wrist fracture, right 12/2009    Past Surgical History:  Procedure Laterality Date  . BLEPHAROPLASTY    . Cataract Surgery Bilateral   . NERVE BIOPSY    . TONSILLECTOMY    . TOTAL ABDOMINAL HYSTERECTOMY W/ BILATERAL SALPINGOOPHORECTOMY  01/2012   , Partial resection of the sigmoid colon    Current Outpatient Prescriptions  Medication Sig Dispense Refill  . Cholecalciferol (VITAMIN D) 2000 UNITS tablet Take 2,000 Units by mouth daily.    . ciprofloxacin (CIPRO) 250 MG tablet Take 250 mg by mouth 2 (two) times  daily as needed (for cystitis symptoms). For symptoms    . diltiazem (CARDIZEM CD) 180 MG 24 hr capsule TAKE 1 CAPSULE BY MOUTH IN THE MORNING ON AN EMPTY STOMACH. 30 capsule 5  . levothyroxine (SYNTHROID, LEVOTHROID) 75 MCG tablet Take 75 mcg by mouth every other day.     . lisinopril (PRINIVIL,ZESTRIL) 10 MG tablet Take 10 mg by mouth daily.    Marland Kitchen loratadine (CLARITIN) 10 MG tablet Take 10 mg by mouth daily as needed for allergies.     . metoprolol succinate (TOPROL-XL) 100 MG 24 hr tablet Take 1 tablet (100 mg total) by mouth daily. 30 tablet 5  . Misc Natural Products (LUTEIN 20) CAPS Take 1 capsule by mouth daily.    . potassium chloride SA (K-DUR,KLOR-CON) 20 MEQ tablet Take 20 mEq by mouth daily.     . Probiotic Product (ALIGN PO) Take 1 capsule by mouth daily.    Marland Kitchen SYNTHROID 50 MCG tablet Take 50 mcg by mouth every other day.     . torsemide (DEMADEX) 20 MG tablet Take 20 mg by mouth every evening.     . warfarin (COUMADIN) 3 MG tablet TAKE AS DIRECTED BY COUMADIN CLINIC. 20 tablet 2   No current facility-administered medications for this visit.     Allergies:    Allergies  Allergen Reactions  . Plendil [Felodipine]   . Aspirin     Stomach upset  . Barbiturates     Poorly tolerated  . Eggs Or Egg-Derived Products     diarrhea  . Epinephrine     Fever   . Erythromycin Base Nausea And Vomiting    All mycins  . Felodipine Er     headache  . Iodinated Diagnostic Agents     Kidney faiilure  . Iodine Other (See Comments)    Kidneys shut down  . Morphine And Related     Rash   . Oxycodone Hcl     Heart race  . Penicillins     Fever   . Pravachol     Muscle ache  . Soy Allergy     shaking  . Sulfa Antibiotics     rash  . Wheat     Stomach upset  . Adhesive [Tape] Rash    Rash     Social History:  The patient  reports that she has never smoked. She has never used smokeless tobacco. She reports that she does not drink alcohol or use drugs.   ROS:  Please see  the history of present illness.   No bleeding, no syncope, no strokelike symptoms, no chest pain.   All other systems reviewed and negative.   PHYSICAL EXAM: VS:  BP (!) 146/86   Pulse 79   Ht 5\' 3"  (1.6 m)   Wt 161 lb 12.8 oz (73.4 kg)   SpO2 98%   BMI  28.66 kg/m   GEN: Well nourished, well developed, in no acute distress  HEENT: normal  Neck: no JVD, carotid bruits, or masses Cardiac: mildly irreg; 1/6 holosystolic murmur, no rubs, or gallops, chronic edema lower extremities, left greater than right Respiratory:  clear to auscultation bilaterally, normal work of breathing GI: soft, nontender, nondistended, + BS MS: no deformity or atrophy  Skin: warm and dry, no rash Neuro:  Alert and Oriented x 3, Strength and sensation are intact Psych: euthymic mood, full affect   EKG: Today 12/20/16-atrial fibrillation heart rate 79 bpm with nonspecific ST-T wave changes personally viewed 12/18/15-atrial fibrillation 84 with no other significant abnormalities. 11/28/14- atrial fibrillation heart rate 78 with borderline low voltage, no significant change from prior personally viewed-Atrial fibrillation, 83, no changes from prior. 08/31/13-atrial fibrillation rate 85, nonspecific ST-T wave changes, no significant change from prior. Heart rate 85  Echo: 2014-normal EF, moderate mitral regurgitation  LABS: LDL 131 A1c 5.7 hemoglobin 14.4 creatinine 1.5 TSH 0.7  ASSESSMENT AND PLAN:  83 year old with persistent atrial fibrillation/flutter currently rate controlled with chronic anticoagulation, chronic kidney disease stage IV.   1. Atrial fibrillation/flutter permanent-continue with good rate control. On both metoprolol as well as diltiazem. Medications reviewed.  Doing very well, no changes made. 2. Chronic kidney disease stage 3-IV-avoid NSAIDs. Currently on ACE inhibitor. Dr. Laurann [name] is her primary.  Seems to be quite stable, 1.58 3. Insomnia- Dr. Laurann [name]. She has a paradoxical response to  Benadryl. Does not like the way melatonin makes her feel.  This still seems to be her main complaint. 4. Chronic anticoagulation-continue to monitor Coumadin closely. No bleeding. She has her Coumadin checked here.  Doing well. 5. Mild LVH/moderate mitral regurgitation-no change in symptoms. Continue to monitor. Soft murmur. Has some baseline shortness of breath.  Soft murmur heard on exam.    50-month followup   Signed, Candee Furbish, MD [facility]  12/20/2016 8:45 AM

## 2017-01-06 ENCOUNTER — Ambulatory Visit (INDEPENDENT_AMBULATORY_CARE_PROVIDER_SITE_OTHER): Payer: Medicare Other | Admitting: *Deleted

## 2017-01-06 DIAGNOSIS — I4892 Unspecified atrial flutter: Secondary | ICD-10-CM | POA: Diagnosis not present

## 2017-01-06 DIAGNOSIS — Z5181 Encounter for therapeutic drug level monitoring: Secondary | ICD-10-CM | POA: Diagnosis not present

## 2017-01-06 LAB — POCT INR: INR: 2.8

## 2017-01-06 NOTE — Progress Notes (Signed)
01/06/17-INR 2.8 Instructions: Continue taking 1/2 tablet of warfarin every day.  Recheck INR in 3 weeks.

## 2017-01-27 ENCOUNTER — Ambulatory Visit (INDEPENDENT_AMBULATORY_CARE_PROVIDER_SITE_OTHER): Payer: Medicare Other | Admitting: *Deleted

## 2017-01-27 DIAGNOSIS — I4892 Unspecified atrial flutter: Secondary | ICD-10-CM

## 2017-01-27 DIAGNOSIS — Z5181 Encounter for therapeutic drug level monitoring: Secondary | ICD-10-CM | POA: Diagnosis not present

## 2017-01-27 LAB — POCT INR: INR: 2.7

## 2017-01-27 NOTE — Patient Instructions (Addendum)
Continue taking 1/2 tablet of warfarin every day.  Recheck INR in 4 weeks. Call with new medications-Coumadin clinic [phone].

## 2017-02-17 ENCOUNTER — Other Ambulatory Visit: Payer: Self-pay | Admitting: Cardiology

## 2017-02-24 ENCOUNTER — Ambulatory Visit (INDEPENDENT_AMBULATORY_CARE_PROVIDER_SITE_OTHER): Payer: Medicare Other | Admitting: Pharmacist

## 2017-02-24 DIAGNOSIS — Z5181 Encounter for therapeutic drug level monitoring: Secondary | ICD-10-CM

## 2017-02-24 DIAGNOSIS — I4892 Unspecified atrial flutter: Secondary | ICD-10-CM

## 2017-02-24 LAB — POCT INR: INR: 2.2

## 2017-02-24 NOTE — Patient Instructions (Signed)
Continue taking 1/2 tablet of warfarin every day.  Recheck INR in 6 weeks. Call with new medications-Coumadin clinic [phone].

## 2017-03-06 DIAGNOSIS — D2271 Melanocytic nevi of right lower limb, including hip: Secondary | ICD-10-CM | POA: Diagnosis not present

## 2017-03-06 DIAGNOSIS — D1801 Hemangioma of skin and subcutaneous tissue: Secondary | ICD-10-CM | POA: Diagnosis not present

## 2017-03-06 DIAGNOSIS — L821 Other seborrheic keratosis: Secondary | ICD-10-CM | POA: Diagnosis not present

## 2017-03-06 DIAGNOSIS — D2272 Melanocytic nevi of left lower limb, including hip: Secondary | ICD-10-CM | POA: Diagnosis not present

## 2017-03-06 DIAGNOSIS — L570 Actinic keratosis: Secondary | ICD-10-CM | POA: Diagnosis not present

## 2017-03-06 DIAGNOSIS — L814 Other melanin hyperpigmentation: Secondary | ICD-10-CM | POA: Diagnosis not present

## 2017-03-06 DIAGNOSIS — D225 Melanocytic nevi of trunk: Secondary | ICD-10-CM | POA: Diagnosis not present

## 2017-03-07 DIAGNOSIS — Z1389 Encounter for screening for other disorder: Secondary | ICD-10-CM | POA: Diagnosis not present

## 2017-03-07 DIAGNOSIS — Z0000 Encounter for general adult medical examination without abnormal findings: Secondary | ICD-10-CM | POA: Diagnosis not present

## 2017-03-07 DIAGNOSIS — I5032 Chronic diastolic (congestive) heart failure: Secondary | ICD-10-CM | POA: Diagnosis not present

## 2017-03-07 DIAGNOSIS — E039 Hypothyroidism, unspecified: Secondary | ICD-10-CM | POA: Diagnosis not present

## 2017-03-07 DIAGNOSIS — N184 Chronic kidney disease, stage 4 (severe): Secondary | ICD-10-CM | POA: Diagnosis not present

## 2017-03-07 DIAGNOSIS — M5431 Sciatica, right side: Secondary | ICD-10-CM | POA: Diagnosis not present

## 2017-03-07 DIAGNOSIS — M810 Age-related osteoporosis without current pathological fracture: Secondary | ICD-10-CM | POA: Diagnosis not present

## 2017-03-07 DIAGNOSIS — I129 Hypertensive chronic kidney disease with stage 1 through stage 4 chronic kidney disease, or unspecified chronic kidney disease: Secondary | ICD-10-CM | POA: Diagnosis not present

## 2017-03-07 DIAGNOSIS — R7301 Impaired fasting glucose: Secondary | ICD-10-CM | POA: Diagnosis not present

## 2017-03-07 DIAGNOSIS — M5432 Sciatica, left side: Secondary | ICD-10-CM | POA: Diagnosis not present

## 2017-03-07 DIAGNOSIS — I481 Persistent atrial fibrillation: Secondary | ICD-10-CM | POA: Diagnosis not present

## 2017-03-14 DIAGNOSIS — E039 Hypothyroidism, unspecified: Secondary | ICD-10-CM | POA: Diagnosis not present

## 2017-03-14 DIAGNOSIS — R7301 Impaired fasting glucose: Secondary | ICD-10-CM | POA: Diagnosis not present

## 2017-03-14 DIAGNOSIS — I129 Hypertensive chronic kidney disease with stage 1 through stage 4 chronic kidney disease, or unspecified chronic kidney disease: Secondary | ICD-10-CM | POA: Diagnosis not present

## 2017-03-14 DIAGNOSIS — N184 Chronic kidney disease, stage 4 (severe): Secondary | ICD-10-CM | POA: Diagnosis not present

## 2017-03-26 DIAGNOSIS — M810 Age-related osteoporosis without current pathological fracture: Secondary | ICD-10-CM | POA: Diagnosis not present

## 2017-03-26 DIAGNOSIS — N184 Chronic kidney disease, stage 4 (severe): Secondary | ICD-10-CM | POA: Diagnosis not present

## 2017-03-26 DIAGNOSIS — E039 Hypothyroidism, unspecified: Secondary | ICD-10-CM | POA: Diagnosis not present

## 2017-03-26 DIAGNOSIS — I5032 Chronic diastolic (congestive) heart failure: Secondary | ICD-10-CM | POA: Diagnosis not present

## 2017-03-26 DIAGNOSIS — E7800 Pure hypercholesterolemia, unspecified: Secondary | ICD-10-CM | POA: Diagnosis not present

## 2017-03-26 DIAGNOSIS — I481 Persistent atrial fibrillation: Secondary | ICD-10-CM | POA: Diagnosis not present

## 2017-04-07 ENCOUNTER — Ambulatory Visit (INDEPENDENT_AMBULATORY_CARE_PROVIDER_SITE_OTHER): Payer: Medicare Other | Admitting: *Deleted

## 2017-04-07 DIAGNOSIS — Z5181 Encounter for therapeutic drug level monitoring: Secondary | ICD-10-CM

## 2017-04-07 DIAGNOSIS — I4892 Unspecified atrial flutter: Secondary | ICD-10-CM

## 2017-04-07 LAB — POCT INR: INR: 2.7

## 2017-04-07 NOTE — Patient Instructions (Signed)
Description   Continue taking 1/2 tablet of warfarin every day.  Recheck INR in 6 weeks. Call with new medications-Coumadin clinic #[phone].     

## 2017-05-15 ENCOUNTER — Other Ambulatory Visit: Payer: Self-pay | Admitting: Cardiology

## 2017-05-15 DIAGNOSIS — K5900 Constipation, unspecified: Secondary | ICD-10-CM | POA: Diagnosis not present

## 2017-05-15 DIAGNOSIS — R109 Unspecified abdominal pain: Secondary | ICD-10-CM | POA: Diagnosis not present

## 2017-05-19 ENCOUNTER — Ambulatory Visit (INDEPENDENT_AMBULATORY_CARE_PROVIDER_SITE_OTHER): Payer: Medicare Other | Admitting: *Deleted

## 2017-05-19 DIAGNOSIS — Z5181 Encounter for therapeutic drug level monitoring: Secondary | ICD-10-CM | POA: Diagnosis not present

## 2017-05-19 DIAGNOSIS — I4892 Unspecified atrial flutter: Secondary | ICD-10-CM

## 2017-05-19 LAB — POCT INR: INR: 3

## 2017-05-19 NOTE — Patient Instructions (Addendum)
Description   Continue taking 1/2 tablet of warfarin every day.  Recheck INR in 5-6 weeks. Call with new medications-Coumadin clinic [phone].

## 2017-06-23 ENCOUNTER — Ambulatory Visit (INDEPENDENT_AMBULATORY_CARE_PROVIDER_SITE_OTHER): Payer: Medicare Other | Admitting: *Deleted

## 2017-06-23 DIAGNOSIS — Z5181 Encounter for therapeutic drug level monitoring: Secondary | ICD-10-CM

## 2017-06-23 DIAGNOSIS — I4892 Unspecified atrial flutter: Secondary | ICD-10-CM | POA: Diagnosis not present

## 2017-06-23 LAB — POCT INR: INR: 1.9

## 2017-06-23 NOTE — Patient Instructions (Signed)
Description   Today April 29th take 1 tablet then continue taking 1/2 tablet of warfarin every day.  Recheck INR in 2 weeks. Call with new medications-Coumadin clinic [phone].

## 2017-07-11 ENCOUNTER — Ambulatory Visit (INDEPENDENT_AMBULATORY_CARE_PROVIDER_SITE_OTHER): Payer: Medicare Other | Admitting: *Deleted

## 2017-07-11 DIAGNOSIS — Z5181 Encounter for therapeutic drug level monitoring: Secondary | ICD-10-CM

## 2017-07-11 LAB — POCT INR: INR: 2.6

## 2017-07-11 NOTE — Patient Instructions (Signed)
Description   Continue taking 1/2 tablet of warfarin every day.  Recheck INR in 6 weeks. Call with new medications-Coumadin clinic #[phone].     

## 2017-08-07 DIAGNOSIS — H353133 Nonexudative age-related macular degeneration, bilateral, advanced atrophic without subfoveal involvement: Secondary | ICD-10-CM | POA: Diagnosis not present

## 2017-08-22 ENCOUNTER — Encounter (INDEPENDENT_AMBULATORY_CARE_PROVIDER_SITE_OTHER): Payer: Self-pay

## 2017-08-22 ENCOUNTER — Ambulatory Visit (INDEPENDENT_AMBULATORY_CARE_PROVIDER_SITE_OTHER): Payer: Medicare Other | Admitting: *Deleted

## 2017-08-22 DIAGNOSIS — Z5181 Encounter for therapeutic drug level monitoring: Secondary | ICD-10-CM

## 2017-08-22 DIAGNOSIS — I4892 Unspecified atrial flutter: Secondary | ICD-10-CM

## 2017-08-22 LAB — POCT INR: INR: 2.8 (ref 2.0–3.0)

## 2017-08-22 NOTE — Patient Instructions (Signed)
Description   Continue taking 1/2 tablet of warfarin every day.  Recheck INR in 6 weeks. Call with new medications-Coumadin clinic [phone].

## 2017-08-29 DIAGNOSIS — H53413 Scotoma involving central area, bilateral: Secondary | ICD-10-CM | POA: Diagnosis not present

## 2017-09-04 DIAGNOSIS — I5032 Chronic diastolic (congestive) heart failure: Secondary | ICD-10-CM | POA: Diagnosis not present

## 2017-09-04 DIAGNOSIS — I129 Hypertensive chronic kidney disease with stage 1 through stage 4 chronic kidney disease, or unspecified chronic kidney disease: Secondary | ICD-10-CM | POA: Diagnosis not present

## 2017-09-04 DIAGNOSIS — I481 Persistent atrial fibrillation: Secondary | ICD-10-CM | POA: Diagnosis not present

## 2017-09-04 DIAGNOSIS — N184 Chronic kidney disease, stage 4 (severe): Secondary | ICD-10-CM | POA: Diagnosis not present

## 2017-09-05 DIAGNOSIS — M810 Age-related osteoporosis without current pathological fracture: Secondary | ICD-10-CM | POA: Diagnosis not present

## 2017-09-08 DIAGNOSIS — H53413 Scotoma involving central area, bilateral: Secondary | ICD-10-CM | POA: Diagnosis not present

## 2017-09-12 DIAGNOSIS — H53413 Scotoma involving central area, bilateral: Secondary | ICD-10-CM | POA: Diagnosis not present

## 2017-09-25 DIAGNOSIS — H53413 Scotoma involving central area, bilateral: Secondary | ICD-10-CM | POA: Diagnosis not present

## 2017-10-01 DIAGNOSIS — H53413 Scotoma involving central area, bilateral: Secondary | ICD-10-CM | POA: Diagnosis not present

## 2017-10-03 ENCOUNTER — Ambulatory Visit (INDEPENDENT_AMBULATORY_CARE_PROVIDER_SITE_OTHER): Payer: Medicare Other | Admitting: *Deleted

## 2017-10-03 DIAGNOSIS — I4892 Unspecified atrial flutter: Secondary | ICD-10-CM | POA: Diagnosis not present

## 2017-10-03 DIAGNOSIS — Z5181 Encounter for therapeutic drug level monitoring: Secondary | ICD-10-CM

## 2017-10-03 LAB — POCT INR: INR: 2.1 (ref 2.0–3.0)

## 2017-10-03 NOTE — Patient Instructions (Signed)
Description   Continue taking 1/2 tablet of warfarin every day.  Recheck INR in 6 weeks. Call with new medications-Coumadin clinic [phone].

## 2017-10-13 DIAGNOSIS — H53413 Scotoma involving central area, bilateral: Secondary | ICD-10-CM | POA: Diagnosis not present

## 2017-10-21 DIAGNOSIS — H53413 Scotoma involving central area, bilateral: Secondary | ICD-10-CM | POA: Diagnosis not present

## 2017-11-03 DIAGNOSIS — H53413 Scotoma involving central area, bilateral: Secondary | ICD-10-CM | POA: Diagnosis not present

## 2017-11-11 DIAGNOSIS — H53413 Scotoma involving central area, bilateral: Secondary | ICD-10-CM | POA: Diagnosis not present

## 2017-11-14 ENCOUNTER — Ambulatory Visit (INDEPENDENT_AMBULATORY_CARE_PROVIDER_SITE_OTHER): Payer: Medicare Other | Admitting: *Deleted

## 2017-11-14 DIAGNOSIS — Z5181 Encounter for therapeutic drug level monitoring: Secondary | ICD-10-CM

## 2017-11-14 DIAGNOSIS — I4892 Unspecified atrial flutter: Secondary | ICD-10-CM

## 2017-11-14 DIAGNOSIS — Z23 Encounter for immunization: Secondary | ICD-10-CM | POA: Diagnosis not present

## 2017-11-14 LAB — POCT INR: INR: 2.2 (ref 2.0–3.0)

## 2017-11-14 NOTE — Patient Instructions (Signed)
Description   Continue taking 1/2 tablet of warfarin every day.  Recheck INR in 6 weeks. Call with new medications-Coumadin clinic [phone].

## 2017-11-17 DIAGNOSIS — H53413 Scotoma involving central area, bilateral: Secondary | ICD-10-CM | POA: Diagnosis not present

## 2017-11-24 DIAGNOSIS — H53413 Scotoma involving central area, bilateral: Secondary | ICD-10-CM | POA: Diagnosis not present

## 2017-12-01 DIAGNOSIS — H53413 Scotoma involving central area, bilateral: Secondary | ICD-10-CM | POA: Diagnosis not present

## 2017-12-08 ENCOUNTER — Other Ambulatory Visit: Payer: Self-pay | Admitting: Cardiology

## 2017-12-12 ENCOUNTER — Telehealth: Payer: Self-pay | Admitting: Cardiology

## 2017-12-12 NOTE — Telephone Encounter (Signed)
This should be ok with her cardiac medications. I am doubtful that this will effect her coumadin as well.

## 2017-12-12 NOTE — Telephone Encounter (Signed)
Will have our pharmacist and Dr Marlou Porch review, then I will call back back with recommendation.

## 2017-12-12 NOTE — Telephone Encounter (Signed)
Pt aware should be OK with cardiac medications.  She was grateful for the call and information.

## 2017-12-12 NOTE — Telephone Encounter (Signed)
New Message:     Pt wants to know if it will be alright for her to take this herbal medicine Calm Forte? She said it is a a medicine that helps you sleep.

## 2017-12-19 DIAGNOSIS — H53413 Scotoma involving central area, bilateral: Secondary | ICD-10-CM | POA: Diagnosis not present

## 2017-12-23 DIAGNOSIS — H53413 Scotoma involving central area, bilateral: Secondary | ICD-10-CM | POA: Diagnosis not present

## 2017-12-24 ENCOUNTER — Encounter: Payer: Self-pay | Admitting: Cardiology

## 2017-12-24 ENCOUNTER — Ambulatory Visit (INDEPENDENT_AMBULATORY_CARE_PROVIDER_SITE_OTHER): Payer: Medicare Other | Admitting: Cardiology

## 2017-12-24 ENCOUNTER — Ambulatory Visit (INDEPENDENT_AMBULATORY_CARE_PROVIDER_SITE_OTHER): Payer: Medicare Other | Admitting: *Deleted

## 2017-12-24 VITALS — BP 140/80 | HR 85 | Ht 63.0 in | Wt 165.6 lb

## 2017-12-24 DIAGNOSIS — Z5181 Encounter for therapeutic drug level monitoring: Secondary | ICD-10-CM | POA: Diagnosis not present

## 2017-12-24 DIAGNOSIS — I484 Atypical atrial flutter: Secondary | ICD-10-CM

## 2017-12-24 DIAGNOSIS — N184 Chronic kidney disease, stage 4 (severe): Secondary | ICD-10-CM | POA: Diagnosis not present

## 2017-12-24 DIAGNOSIS — I4892 Unspecified atrial flutter: Secondary | ICD-10-CM

## 2017-12-24 DIAGNOSIS — Z7901 Long term (current) use of anticoagulants: Secondary | ICD-10-CM | POA: Diagnosis not present

## 2017-12-24 DIAGNOSIS — I340 Nonrheumatic mitral (valve) insufficiency: Secondary | ICD-10-CM | POA: Diagnosis not present

## 2017-12-24 LAB — POCT INR: INR: 2.9 (ref 2.0–3.0)

## 2017-12-24 NOTE — Progress Notes (Signed)
Cardiology Office Note:    Date:  12/24/2017   ID:  TAMI BLASS, DOB [birth year], MRN [id number]  PCP:  Lavone Orn, MD  Cardiologist:  No primary care provider on file.  Electrophysiologist:  None   Referring MD: Lavone Orn, MD     History of Present Illness:    [name] is a 84 y.o. female here for follow-up of atrial flutter persistent rate controlled.  She has had some chronic left lower extremity edema, sees a podiatrist, also has legal blindness with macular degeneration.  Insomnia continues to be an issue.  Previously told me about her cat named Fuji who has been trained to sit and jump on her shoulders. To mail box mild SOB.   Unfortunately, her previous spouse died yesterday.  He was 71 years old.  Pacing happy birthday to him as his birthday was today, granddaughter was living in [place] where it was officially 1 day ahead.  No syncopal episodes, no orthopnea.  She states that she prays for [place].  Hemoglobin 14.5, creatinine 1.4 on 05/15/2017  Past Medical History:  Diagnosis Date  . Atrial fibrillation ([place])   . Blood transfusion    " no reaction to transfusion"  . Cancer ([place])   . Complication of anesthesia    sensitive to drugs  . Dysrhythmia    atrial flutter  . Headache(784.0)   . Hyperlipidemia   . Hypertension   . Hypothyroidism   . Impaired fasting glucose   . Kidney disease, chronic, stage IV (GFR 15-29 ml/min) (HCC)   . 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 Macular degeneration   . Migraine headache    Improved after menopause  . Mitral regurgitation 11/27/2012  . Renal disorder   . Shortness of breath   . Shortness of breath    Cardiology Eval., possible diastolic dysfunctio  . UTI (urinary tract infection)    Frequent  . Visual loss, right eye    Left ocular accident  . Vitamin D deficiency   . Wrist fracture, right 12/2009    Past Surgical History:  Procedure Laterality Date  .  BLEPHAROPLASTY    . Cataract Surgery Bilateral   . NERVE BIOPSY    . TONSILLECTOMY    . TOTAL ABDOMINAL HYSTERECTOMY W/ BILATERAL SALPINGOOPHORECTOMY  01/2012   , Partial resection of the sigmoid colon    Current Medications: Current Meds  Medication Sig  . Cholecalciferol (VITAMIN D) 2000 UNITS tablet Take 2,000 Units by mouth daily.  . ciprofloxacin (CIPRO) 250 MG tablet Take 250 mg by mouth 2 (two) times daily as needed (for cystitis symptoms). For symptoms  . diltiazem (CARDIZEM CD) 180 MG 24 hr capsule Take 1 capsule in the morning on an empty stomach. Please keep upcoming appt in October with Dr. Marlou Porch for future refills. Thank you  . levothyroxine (SYNTHROID, LEVOTHROID) 75 MCG tablet Take 75 mcg by mouth every other day.   . lisinopril (PRINIVIL,ZESTRIL) 10 MG tablet Take 10 mg by mouth daily.  Marland Kitchen loratadine (CLARITIN) 10 MG tablet Take 10 mg by mouth daily as needed for allergies.   . metoprolol succinate (TOPROL-XL) 100 MG 24 hr tablet Take 1 tablet (100 mg total) by mouth daily. Please keep upcoming appt in October with Dr. Marlou Porch for future refills. Thank you  . Misc Natural Products (LUTEIN 20) CAPS Take 1 capsule by mouth daily.  . potassium chloride SA (K-DUR,KLOR-CON) 20 MEQ tablet Take 20 mEq by mouth  daily.   . Probiotic Product (ALIGN PO) Take 1 capsule by mouth daily.  Marland Kitchen SYNTHROID 50 MCG tablet Take 50 mcg by mouth every other day.   . torsemide (DEMADEX) 20 MG tablet Take 20 mg by mouth every evening.   . warfarin (COUMADIN) 3 MG tablet TAKE AS DIRECTED BY COUMADIN CLINIC.     Allergies:   Plendil [felodipine]; Aspirin; Barbiturates; Eggs or egg-derived products; Epinephrine; Erythromycin base; Felodipine er; Iodinated diagnostic agents; Iodine; Morphine and related; Oxycodone hcl; Penicillins; Pravachol; Soy allergy; Sulfa antibiotics; Wheat; and Adhesive [tape]   Social History   Socioeconomic History  . Marital status: Single    Spouse name: Not on file  .  Number of children: Not on file  . Years of education: Not on file  . Highest education level: Not on file  Occupational History  . Not on file  Social Needs  . Financial resource strain: Not on file  . Food insecurity:    Worry: Not on file    Inability: Not on file  . Transportation needs:    Medical: Not on file    Non-medical: Not on file  Tobacco Use  . Smoking status: Never Smoker  . Smokeless tobacco: Never Used  Substance and Sexual Activity  . Alcohol use: No  . Drug use: No  . Sexual activity: Never    Birth control/protection: Post-menopausal  Lifestyle  . Physical activity:    Days per week: Not on file    Minutes per session: Not on file  . Stress: Not on file  Relationships  . Social connections:    Talks on phone: Not on file    Gets together: Not on file    Attends religious service: Not on file    Active member of club or organization: Not on file    Attends meetings of clubs or organizations: Not on file    Relationship status: Not on file  Other Topics Concern  . Not on file  Social History Narrative  . Not on file     Family History: The patient's family history includes Cancer in her father; Heart disease in her maternal grandfather, maternal grandmother, and mother; Hypertension in her mother and sister; Peripheral Artery Disease in her sister. There is no history of Breast cancer.  ROS:   Please see the history of present illness.    Denies any fevers chills nausea vomiting syncope bleeding all other systems reviewed and are negative.  EKGs/Labs/Other Studies Reviewed:    The following studies were reviewed today:  Echocardiogram 2014 normal EF with moderate mitral regurgitation.  EKG:  EKG is  ordered today.  The ekg ordered today demonstrates 12/24/2017-atrial fibrillation heart rate 85 bpm with incomplete right bundle branch block, nonspecific ST-T wave changes personally reviewed and interpreted-prior from 12/20/2016 shows atrial  fibrillation heart rate 79 bpm with nonspecific ST-T wave changes.  Personally reviewed and interpreted.  Recent Labs: No results found for requested labs within last 8760 hours.  Recent Lipid Panel No results found for: CHOL, TRIG, HDL, CHOLHDL, VLDL, LDLCALC, LDLDIRECT  Physical Exam:    VS:  BP 140/80   Pulse 85   Ht 5\' 3"  (1.6 m)   Wt 165 lb 9.6 oz (75.1 kg)   SpO2 97%   BMI 29.33 kg/m     Wt Readings from Last 3 Encounters:  12/24/17 165 lb 9.6 oz (75.1 kg)  12/20/16 161 lb 12.8 oz (73.4 kg)  03/25/16 165 lb (74.8 kg)  GEN:  Well nourished, well developed in no acute distress HEENT: Normal NECK: No JVD; No carotid bruits LYMPHATICS: No lymphadenopathy CARDIAC: irreg irreg, 2/6 SM apex, rubs, gallops RESPIRATORY:  Clear to auscultation without rales, wheezing or rhonchi  ABDOMEN: Soft, non-tender, non-distended MUSCULOSKELETAL:  No edema; No deformity  SKIN: Warm and dry NEUROLOGIC:  Alert and oriented x 3 PSYCHIATRIC:  Normal affect   ASSESSMENT:    1. Mitral valve insufficiency, unspecified etiology   2. Atypical atrial flutter (HCC)   3. Non-rheumatic mitral regurgitation   4. Chronic anticoagulation   5. CKD (chronic kidney disease), stage IV (HCC)    PLAN:    In order of problems listed above:  Permanent atrial fibrillation/flutter -Good rate control on metoprolol and diltiazem.  No medication changes.  Chronic kidney disease stage III/IV -Creatinine has been approximately 1.4 to 1.6.  Dr. Laurann Montana has been monitoring.  No NSAIDs.  She is currently on ACE inhibitor.  Chronic anticoagulation -Continue to monitor Coumadin closely.  No bleeding.  Last check 2.2.  Moderate mitral regurgitation/mild LVH seen on prior echocardiogram - Seems to be doing quite well with symptoms. Will check ECHO.  Continue to monitor clinically.  Insomnia -In the past has had a paradoxical response to Benadryl.  Melatonin makes her feel bad.  This in the past has been  her main complaint.  Medication Adjustments/Labs and Tests Ordered: Current medicines are reviewed at length with the patient today.  Concerns regarding medicines are outlined above.  Orders Placed This Encounter  Procedures  . EKG 12-Lead  . ECHOCARDIOGRAM COMPLETE   No orders of the defined types were placed in this encounter.   Patient Instructions  Your physician recommends that you continue on your current medications as directed. Please refer to the Current Medication list given to you today.   Your physician has requested that you have an echocardiogram. Echocardiography is a painless test that uses sound waves to create images of your heart. It provides your doctor with information about the size and shape of your heart and how well your heart's chambers and valves are working. This procedure takes approximately one hour. There are no restrictions for this procedure.   Your physician wants you to follow-up WU:XLKG WITH DR Marlou Porch  You will receive a reminder letter in the mail two months in advance. If you don't receive a letter, please call our office to schedule the follow-up appointment.    Signed, Candee Furbish, MD  12/24/2017 10:01 AM    [facility]

## 2017-12-24 NOTE — Patient Instructions (Signed)
Description   Continue taking 1/2 tablet of warfarin every day.  Recheck INR in 6 weeks. Call with new medications-Coumadin clinic [phone].

## 2017-12-24 NOTE — Patient Instructions (Addendum)
Your physician recommends that you continue on your current medications as directed. Please refer to the Current Medication list given to you today.   Your physician has requested that you have an echocardiogram. Echocardiography is a painless test that uses sound waves to create images of your heart. It provides your doctor with information about the size and shape of your heart and how well your heart's chambers and valves are working. This procedure takes approximately one hour. There are no restrictions for this procedure.   Your physician wants you to follow-up [id number] WITH DR Marlou Porch  You will receive a reminder letter in the mail two months in advance. If you don't receive a letter, please call our office to schedule the follow-up appointment.

## 2017-12-25 DIAGNOSIS — Z961 Presence of intraocular lens: Secondary | ICD-10-CM | POA: Diagnosis not present

## 2017-12-25 DIAGNOSIS — H353232 Exudative age-related macular degeneration, bilateral, with inactive choroidal neovascularization: Secondary | ICD-10-CM | POA: Diagnosis not present

## 2017-12-25 DIAGNOSIS — H43813 Vitreous degeneration, bilateral: Secondary | ICD-10-CM | POA: Diagnosis not present

## 2017-12-25 DIAGNOSIS — H31013 Macula scars of posterior pole (postinflammatory) (post-traumatic), bilateral: Secondary | ICD-10-CM | POA: Diagnosis not present

## 2018-01-06 DIAGNOSIS — H53413 Scotoma involving central area, bilateral: Secondary | ICD-10-CM | POA: Diagnosis not present

## 2018-01-08 ENCOUNTER — Other Ambulatory Visit: Payer: Self-pay | Admitting: Cardiology

## 2018-01-12 DIAGNOSIS — H53413 Scotoma involving central area, bilateral: Secondary | ICD-10-CM | POA: Diagnosis not present

## 2018-02-04 ENCOUNTER — Encounter (INDEPENDENT_AMBULATORY_CARE_PROVIDER_SITE_OTHER): Payer: Self-pay

## 2018-02-04 ENCOUNTER — Ambulatory Visit (HOSPITAL_COMMUNITY): Payer: Medicare Other | Attending: Cardiology

## 2018-02-04 ENCOUNTER — Ambulatory Visit (INDEPENDENT_AMBULATORY_CARE_PROVIDER_SITE_OTHER): Payer: Medicare Other | Admitting: Pharmacist

## 2018-02-04 ENCOUNTER — Other Ambulatory Visit: Payer: Self-pay

## 2018-02-04 DIAGNOSIS — I4892 Unspecified atrial flutter: Secondary | ICD-10-CM

## 2018-02-04 DIAGNOSIS — Z5181 Encounter for therapeutic drug level monitoring: Secondary | ICD-10-CM | POA: Diagnosis not present

## 2018-02-04 DIAGNOSIS — I340 Nonrheumatic mitral (valve) insufficiency: Secondary | ICD-10-CM | POA: Diagnosis not present

## 2018-02-04 LAB — POCT INR: INR: 3.3 — AB (ref 2.0–3.0)

## 2018-02-04 NOTE — Patient Instructions (Signed)
Hold warfarin tonight then continue taking 1/2 tablet of warfarin every day.  Recheck INR in 4 weeks. Call with new medications-Coumadin clinic [phone].

## 2018-02-05 DIAGNOSIS — H53413 Scotoma involving central area, bilateral: Secondary | ICD-10-CM | POA: Diagnosis not present

## 2018-02-06 ENCOUNTER — Telehealth: Payer: Self-pay | Admitting: *Deleted

## 2018-02-06 DIAGNOSIS — I340 Nonrheumatic mitral (valve) insufficiency: Secondary | ICD-10-CM

## 2018-02-06 NOTE — Telephone Encounter (Signed)
-----   Message from Jerline Pain, MD sent at 02/06/2018  4:28 PM EST ----- Mild to moderate aortic stenosis.  Mild mitral regurgitation.  Repeat echocardiogram in one year to monitor aortic stenosis.  Candee Furbish, MD

## 2018-02-06 NOTE — Telephone Encounter (Signed)
Pt has been notified of echo results by phone with verbal understanding. Pt thanked me for the call. Pt agreeable to repeat echo in 1 year ------

## 2018-02-13 DIAGNOSIS — H53413 Scotoma involving central area, bilateral: Secondary | ICD-10-CM | POA: Diagnosis not present

## 2018-03-04 ENCOUNTER — Ambulatory Visit (INDEPENDENT_AMBULATORY_CARE_PROVIDER_SITE_OTHER): Payer: Medicare Other | Admitting: *Deleted

## 2018-03-04 DIAGNOSIS — I4892 Unspecified atrial flutter: Secondary | ICD-10-CM

## 2018-03-04 DIAGNOSIS — Z5181 Encounter for therapeutic drug level monitoring: Secondary | ICD-10-CM

## 2018-03-04 LAB — POCT INR: INR: 3.1 — AB (ref 2.0–3.0)

## 2018-03-04 NOTE — Patient Instructions (Addendum)
  Description   Continue taking 1/2 tablet of warfarin every day.  Recheck INR in 2 weeks. Call with new medications-Coumadin clinic [phone]. Increase your leafy green vegetable by another serving.

## 2018-03-13 DIAGNOSIS — M810 Age-related osteoporosis without current pathological fracture: Secondary | ICD-10-CM | POA: Diagnosis not present

## 2018-03-13 DIAGNOSIS — E7800 Pure hypercholesterolemia, unspecified: Secondary | ICD-10-CM | POA: Diagnosis not present

## 2018-03-13 DIAGNOSIS — I129 Hypertensive chronic kidney disease with stage 1 through stage 4 chronic kidney disease, or unspecified chronic kidney disease: Secondary | ICD-10-CM | POA: Diagnosis not present

## 2018-03-13 DIAGNOSIS — E039 Hypothyroidism, unspecified: Secondary | ICD-10-CM | POA: Diagnosis not present

## 2018-03-13 DIAGNOSIS — Z0000 Encounter for general adult medical examination without abnormal findings: Secondary | ICD-10-CM | POA: Diagnosis not present

## 2018-03-13 DIAGNOSIS — R7301 Impaired fasting glucose: Secondary | ICD-10-CM | POA: Diagnosis not present

## 2018-03-13 DIAGNOSIS — Z1389 Encounter for screening for other disorder: Secondary | ICD-10-CM | POA: Diagnosis not present

## 2018-03-13 DIAGNOSIS — N184 Chronic kidney disease, stage 4 (severe): Secondary | ICD-10-CM | POA: Diagnosis not present

## 2018-03-13 DIAGNOSIS — I5032 Chronic diastolic (congestive) heart failure: Secondary | ICD-10-CM | POA: Diagnosis not present

## 2018-03-13 DIAGNOSIS — I482 Chronic atrial fibrillation, unspecified: Secondary | ICD-10-CM | POA: Diagnosis not present

## 2018-03-19 ENCOUNTER — Ambulatory Visit (INDEPENDENT_AMBULATORY_CARE_PROVIDER_SITE_OTHER): Payer: Medicare Other | Admitting: *Deleted

## 2018-03-19 DIAGNOSIS — I4892 Unspecified atrial flutter: Secondary | ICD-10-CM | POA: Diagnosis not present

## 2018-03-19 DIAGNOSIS — Z5181 Encounter for therapeutic drug level monitoring: Secondary | ICD-10-CM

## 2018-03-19 LAB — POCT INR: INR: 2.8 (ref 2.0–3.0)

## 2018-03-19 NOTE — Patient Instructions (Signed)
Description   Continue taking 1/2 tablet of warfarin every day.  Recheck INR in 4 weeks. Call with new medications-Coumadin clinic [phone]. Increase your leafy green vegetable by another serving.

## 2018-04-13 ENCOUNTER — Other Ambulatory Visit: Payer: Self-pay | Admitting: Cardiology

## 2018-04-14 ENCOUNTER — Other Ambulatory Visit: Payer: Self-pay | Admitting: Cardiology

## 2018-04-16 ENCOUNTER — Ambulatory Visit (INDEPENDENT_AMBULATORY_CARE_PROVIDER_SITE_OTHER): Payer: Medicare Other | Admitting: Pharmacist

## 2018-04-16 DIAGNOSIS — I4892 Unspecified atrial flutter: Secondary | ICD-10-CM

## 2018-04-16 DIAGNOSIS — Z5181 Encounter for therapeutic drug level monitoring: Secondary | ICD-10-CM

## 2018-04-16 LAB — POCT INR: INR: 3.5 — AB (ref 2.0–3.0)

## 2018-04-16 NOTE — Patient Instructions (Signed)
Description   No warfarin today then continue taking 1/2 tablet of warfarin every day.  Recheck INR in 3 weeks. Call with new medications-Coumadin clinic [phone]. Increase your leafy green vegetable by another serving.

## 2018-04-28 DIAGNOSIS — H31013 Macula scars of posterior pole (postinflammatory) (post-traumatic), bilateral: Secondary | ICD-10-CM | POA: Diagnosis not present

## 2018-04-28 DIAGNOSIS — H43813 Vitreous degeneration, bilateral: Secondary | ICD-10-CM | POA: Diagnosis not present

## 2018-04-28 DIAGNOSIS — H353232 Exudative age-related macular degeneration, bilateral, with inactive choroidal neovascularization: Secondary | ICD-10-CM | POA: Diagnosis not present

## 2018-04-28 DIAGNOSIS — Z961 Presence of intraocular lens: Secondary | ICD-10-CM | POA: Diagnosis not present

## 2018-05-05 ENCOUNTER — Ambulatory Visit (INDEPENDENT_AMBULATORY_CARE_PROVIDER_SITE_OTHER): Payer: Medicare Other

## 2018-05-05 DIAGNOSIS — Z5181 Encounter for therapeutic drug level monitoring: Secondary | ICD-10-CM

## 2018-05-05 DIAGNOSIS — I4892 Unspecified atrial flutter: Secondary | ICD-10-CM | POA: Diagnosis not present

## 2018-05-05 LAB — POCT INR: INR: 2.8 (ref 2.0–3.0)

## 2018-05-05 NOTE — Patient Instructions (Signed)
Description   Continue on same dosage 1/2 tablet daily.  Recheck INR in 4 weeks. Call with new medications-Coumadin clinic [phone]. Increase your leafy green vegetable by another serving.

## 2018-05-08 DIAGNOSIS — D2262 Melanocytic nevi of left upper limb, including shoulder: Secondary | ICD-10-CM | POA: Diagnosis not present

## 2018-05-08 DIAGNOSIS — L308 Other specified dermatitis: Secondary | ICD-10-CM | POA: Diagnosis not present

## 2018-05-08 DIAGNOSIS — H0262 Xanthelasma of right lower eyelid: Secondary | ICD-10-CM | POA: Diagnosis not present

## 2018-05-08 DIAGNOSIS — L218 Other seborrheic dermatitis: Secondary | ICD-10-CM | POA: Diagnosis not present

## 2018-05-08 DIAGNOSIS — D1801 Hemangioma of skin and subcutaneous tissue: Secondary | ICD-10-CM | POA: Diagnosis not present

## 2018-05-08 DIAGNOSIS — D2271 Melanocytic nevi of right lower limb, including hip: Secondary | ICD-10-CM | POA: Diagnosis not present

## 2018-05-08 DIAGNOSIS — L821 Other seborrheic keratosis: Secondary | ICD-10-CM | POA: Diagnosis not present

## 2018-05-08 DIAGNOSIS — H0265 Xanthelasma of left lower eyelid: Secondary | ICD-10-CM | POA: Diagnosis not present

## 2018-05-08 DIAGNOSIS — D225 Melanocytic nevi of trunk: Secondary | ICD-10-CM | POA: Diagnosis not present

## 2018-05-08 DIAGNOSIS — L814 Other melanin hyperpigmentation: Secondary | ICD-10-CM | POA: Diagnosis not present

## 2018-06-01 ENCOUNTER — Telehealth: Payer: Self-pay

## 2018-06-01 NOTE — Telephone Encounter (Signed)
lmom prescreen/driveup.

## 2018-06-01 NOTE — Telephone Encounter (Signed)

## 2018-06-02 ENCOUNTER — Other Ambulatory Visit: Payer: Self-pay

## 2018-06-02 ENCOUNTER — Ambulatory Visit (INDEPENDENT_AMBULATORY_CARE_PROVIDER_SITE_OTHER): Payer: Medicare Other | Admitting: Pharmacist

## 2018-06-02 DIAGNOSIS — Z5181 Encounter for therapeutic drug level monitoring: Secondary | ICD-10-CM

## 2018-06-02 DIAGNOSIS — I4892 Unspecified atrial flutter: Secondary | ICD-10-CM | POA: Diagnosis not present

## 2018-06-02 LAB — POCT INR: INR: 2.7 (ref 2.0–3.0)

## 2018-06-02 NOTE — Patient Instructions (Signed)
Description   Spoke with pt & instructed to continue on same dosage 1/2 tablet daily.  Recheck INR in 6 weeks. Call with new medications-Coumadin clinic [phone]. Increase your leafy green vegetable by another serving.

## 2018-07-13 ENCOUNTER — Telehealth: Payer: Self-pay

## 2018-07-13 NOTE — Telephone Encounter (Signed)

## 2018-07-14 ENCOUNTER — Other Ambulatory Visit: Payer: Self-pay

## 2018-07-14 ENCOUNTER — Ambulatory Visit (INDEPENDENT_AMBULATORY_CARE_PROVIDER_SITE_OTHER): Payer: Medicare Other | Admitting: *Deleted

## 2018-07-14 DIAGNOSIS — Z5181 Encounter for therapeutic drug level monitoring: Secondary | ICD-10-CM

## 2018-07-14 DIAGNOSIS — I4892 Unspecified atrial flutter: Secondary | ICD-10-CM

## 2018-07-14 LAB — POCT INR: INR: 2.5 (ref 2.0–3.0)

## 2018-08-07 ENCOUNTER — Other Ambulatory Visit: Payer: Self-pay | Admitting: Cardiology

## 2018-08-18 ENCOUNTER — Telehealth: Payer: Self-pay

## 2018-08-18 NOTE — Telephone Encounter (Signed)

## 2018-08-25 ENCOUNTER — Other Ambulatory Visit: Payer: Self-pay

## 2018-08-25 ENCOUNTER — Ambulatory Visit (INDEPENDENT_AMBULATORY_CARE_PROVIDER_SITE_OTHER): Payer: Medicare Other

## 2018-08-25 DIAGNOSIS — Z5181 Encounter for therapeutic drug level monitoring: Secondary | ICD-10-CM

## 2018-08-25 DIAGNOSIS — I4892 Unspecified atrial flutter: Secondary | ICD-10-CM

## 2018-08-25 LAB — POCT INR: INR: 3 (ref 2.0–3.0)

## 2018-08-25 NOTE — Patient Instructions (Signed)
Description   Continue on same dosage 1/2 tablet daily.  Recheck INR in 6 weeks. Call with new medications-Coumadin clinic [phone].

## 2018-10-06 ENCOUNTER — Ambulatory Visit (INDEPENDENT_AMBULATORY_CARE_PROVIDER_SITE_OTHER): Payer: Medicare Other | Admitting: *Deleted

## 2018-10-06 ENCOUNTER — Other Ambulatory Visit: Payer: Self-pay

## 2018-10-06 DIAGNOSIS — I5032 Chronic diastolic (congestive) heart failure: Secondary | ICD-10-CM | POA: Diagnosis not present

## 2018-10-06 DIAGNOSIS — N184 Chronic kidney disease, stage 4 (severe): Secondary | ICD-10-CM | POA: Diagnosis not present

## 2018-10-06 DIAGNOSIS — Z5181 Encounter for therapeutic drug level monitoring: Secondary | ICD-10-CM

## 2018-10-06 DIAGNOSIS — I4892 Unspecified atrial flutter: Secondary | ICD-10-CM | POA: Diagnosis not present

## 2018-10-06 DIAGNOSIS — Z7901 Long term (current) use of anticoagulants: Secondary | ICD-10-CM

## 2018-10-06 DIAGNOSIS — Z7189 Other specified counseling: Secondary | ICD-10-CM | POA: Diagnosis not present

## 2018-10-06 DIAGNOSIS — I129 Hypertensive chronic kidney disease with stage 1 through stage 4 chronic kidney disease, or unspecified chronic kidney disease: Secondary | ICD-10-CM | POA: Diagnosis not present

## 2018-10-06 DIAGNOSIS — M5432 Sciatica, left side: Secondary | ICD-10-CM | POA: Diagnosis not present

## 2018-10-06 DIAGNOSIS — M810 Age-related osteoporosis without current pathological fracture: Secondary | ICD-10-CM | POA: Diagnosis not present

## 2018-10-06 LAB — POCT INR: INR: 3.1 — AB (ref 2.0–3.0)

## 2018-10-06 NOTE — Patient Instructions (Signed)
Description   Hold today, then continue on same dosage 1/2 tablet daily.  Recheck INR in 4 weeks. Call with new medications-Coumadin clinic [phone].

## 2018-10-09 DIAGNOSIS — M810 Age-related osteoporosis without current pathological fracture: Secondary | ICD-10-CM | POA: Diagnosis not present

## 2018-10-09 DIAGNOSIS — E039 Hypothyroidism, unspecified: Secondary | ICD-10-CM | POA: Diagnosis not present

## 2018-10-09 DIAGNOSIS — N184 Chronic kidney disease, stage 4 (severe): Secondary | ICD-10-CM | POA: Diagnosis not present

## 2018-10-09 DIAGNOSIS — I5032 Chronic diastolic (congestive) heart failure: Secondary | ICD-10-CM | POA: Diagnosis not present

## 2018-10-09 DIAGNOSIS — I129 Hypertensive chronic kidney disease with stage 1 through stage 4 chronic kidney disease, or unspecified chronic kidney disease: Secondary | ICD-10-CM | POA: Diagnosis not present

## 2018-10-09 DIAGNOSIS — I4819 Other persistent atrial fibrillation: Secondary | ICD-10-CM | POA: Diagnosis not present

## 2018-10-26 DIAGNOSIS — Z23 Encounter for immunization: Secondary | ICD-10-CM | POA: Diagnosis not present

## 2018-11-05 ENCOUNTER — Ambulatory Visit (INDEPENDENT_AMBULATORY_CARE_PROVIDER_SITE_OTHER): Payer: Medicare Other | Admitting: *Deleted

## 2018-11-05 ENCOUNTER — Other Ambulatory Visit: Payer: Self-pay

## 2018-11-05 DIAGNOSIS — I4892 Unspecified atrial flutter: Secondary | ICD-10-CM

## 2018-11-05 DIAGNOSIS — Z5181 Encounter for therapeutic drug level monitoring: Secondary | ICD-10-CM | POA: Diagnosis not present

## 2018-11-05 LAB — POCT INR: INR: 3.6 — AB (ref 2.0–3.0)

## 2018-11-05 NOTE — Patient Instructions (Signed)
Description   Hold today, then continue on same dosage 1/2 tablet daily.  Recheck INR in 3 weeks. Call with new medications-Coumadin clinic [phone].

## 2018-11-27 ENCOUNTER — Other Ambulatory Visit: Payer: Self-pay

## 2018-11-27 ENCOUNTER — Ambulatory Visit (INDEPENDENT_AMBULATORY_CARE_PROVIDER_SITE_OTHER): Payer: Medicare Other | Admitting: *Deleted

## 2018-11-27 DIAGNOSIS — Z5181 Encounter for therapeutic drug level monitoring: Secondary | ICD-10-CM | POA: Diagnosis not present

## 2018-11-27 DIAGNOSIS — I4892 Unspecified atrial flutter: Secondary | ICD-10-CM | POA: Diagnosis not present

## 2018-11-27 LAB — POCT INR: INR: 2.7 (ref 2.0–3.0)

## 2018-11-27 NOTE — Patient Instructions (Signed)
Description   Continue on same dosage 1/2 tablet daily.  Recheck INR in 3 weeks. Call with new medications-Coumadin clinic [phone].

## 2018-12-15 DIAGNOSIS — E039 Hypothyroidism, unspecified: Secondary | ICD-10-CM | POA: Diagnosis not present

## 2018-12-15 DIAGNOSIS — N184 Chronic kidney disease, stage 4 (severe): Secondary | ICD-10-CM | POA: Diagnosis not present

## 2018-12-15 DIAGNOSIS — E7800 Pure hypercholesterolemia, unspecified: Secondary | ICD-10-CM | POA: Diagnosis not present

## 2018-12-15 DIAGNOSIS — I129 Hypertensive chronic kidney disease with stage 1 through stage 4 chronic kidney disease, or unspecified chronic kidney disease: Secondary | ICD-10-CM | POA: Diagnosis not present

## 2018-12-15 DIAGNOSIS — I4819 Other persistent atrial fibrillation: Secondary | ICD-10-CM | POA: Diagnosis not present

## 2018-12-15 DIAGNOSIS — M810 Age-related osteoporosis without current pathological fracture: Secondary | ICD-10-CM | POA: Diagnosis not present

## 2018-12-15 DIAGNOSIS — I5032 Chronic diastolic (congestive) heart failure: Secondary | ICD-10-CM | POA: Diagnosis not present

## 2018-12-17 ENCOUNTER — Ambulatory Visit (INDEPENDENT_AMBULATORY_CARE_PROVIDER_SITE_OTHER): Payer: Medicare Other | Admitting: Cardiology

## 2018-12-17 ENCOUNTER — Other Ambulatory Visit: Payer: Self-pay

## 2018-12-17 ENCOUNTER — Ambulatory Visit (INDEPENDENT_AMBULATORY_CARE_PROVIDER_SITE_OTHER): Payer: Medicare Other

## 2018-12-17 ENCOUNTER — Encounter: Payer: Self-pay | Admitting: Cardiology

## 2018-12-17 VITALS — BP 126/80 | HR 89 | Ht 63.0 in | Wt 170.0 lb

## 2018-12-17 DIAGNOSIS — Z5181 Encounter for therapeutic drug level monitoring: Secondary | ICD-10-CM | POA: Diagnosis not present

## 2018-12-17 DIAGNOSIS — I4892 Unspecified atrial flutter: Secondary | ICD-10-CM

## 2018-12-17 DIAGNOSIS — I4821 Permanent atrial fibrillation: Secondary | ICD-10-CM

## 2018-12-17 DIAGNOSIS — N184 Chronic kidney disease, stage 4 (severe): Secondary | ICD-10-CM | POA: Diagnosis not present

## 2018-12-17 DIAGNOSIS — I350 Nonrheumatic aortic (valve) stenosis: Secondary | ICD-10-CM

## 2018-12-17 LAB — POCT INR: INR: 2.9 (ref 2.0–3.0)

## 2018-12-17 NOTE — Patient Instructions (Signed)
Description   Continue on same dosage 1/2 tablet daily.  Recheck INR in 6 weeks. Call with new medications-Coumadin clinic #[phone].     

## 2018-12-17 NOTE — Progress Notes (Signed)
Cardiology Office Note:    Date:  12/17/2018   ID:  [name], DOB 1931/11/16, MRN [id number]  PCP:  Lavone Orn, MD  Cardiologist:  No primary care provider on file.  Electrophysiologist:  None   Referring MD: Lavone Orn, MD     History of Present Illness:    [name] is a 85 y.o. female here for follow-up of atrial flutter persistent rate controlled.    She has had some chronic left lower extremity edema, sees a podiatrist, also has legal blindness with macular degeneration.  Insomnia continues to be an issue.   Previously told me about her cat named Fuji who has been trained to sit and jump on her shoulders. Stands on her at night if she wants something. Loves broccoli. Husband was a Psychologist, clinical. Chicken, geese, swan,   At prior visit she discussed her spouse's death.  He was 81 years old.   She states that she prays for [place].  Hemoglobin 14.5, creatinine 1.4 on 05/15/2017    Past Medical History:  Diagnosis Date  . Atrial fibrillation ([place])   . Blood transfusion    " no reaction to transfusion"  . Cancer ([place])   . Complication of anesthesia    sensitive to drugs  . Dysrhythmia    atrial flutter  . Headache(784.0)   . Hyperlipidemia   . Hypertension   . Hypothyroidism   . Impaired fasting glucose   . Kidney disease, chronic, stage IV (GFR 15-29 ml/min) (HCC)   . 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 Macular degeneration   . Migraine headache    Improved after menopause  . Mitral regurgitation 11/27/2012  . Renal disorder   . Shortness of breath   . Shortness of breath    Cardiology Eval., possible diastolic dysfunctio  . UTI (urinary tract infection)    Frequent  . Visual loss, right eye    Left ocular accident  . Vitamin D deficiency   . Wrist fracture, right 12/2009    Past Surgical History:  Procedure Laterality Date  . BLEPHAROPLASTY    . Cataract Surgery Bilateral   . NERVE BIOPSY    .  TONSILLECTOMY    . TOTAL ABDOMINAL HYSTERECTOMY W/ BILATERAL SALPINGOOPHORECTOMY  01/2012   , Partial resection of the sigmoid colon    Current Medications: Current Meds  Medication Sig  . Cholecalciferol (VITAMIN D) 2000 UNITS tablet Take 2,000 Units by mouth daily.  . ciprofloxacin (CIPRO) 250 MG tablet Take 250 mg by mouth 2 (two) times daily as needed (for cystitis symptoms). For symptoms  . diltiazem (CARDIZEM CD) 180 MG 24 hr capsule TAKE 1 CAPSULE IN THE MORNING ON AN EMPTY STOMACH.  . fluocinonide (LIDEX) 0.05 % external solution   . levothyroxine (SYNTHROID, LEVOTHROID) 75 MCG tablet Take 75 mcg by mouth every other day.   . lisinopril (PRINIVIL,ZESTRIL) 10 MG tablet Take 10 mg by mouth daily.  Marland Kitchen loratadine (CLARITIN) 10 MG tablet Take 10 mg by mouth daily as needed for allergies.   . metoprolol succinate (TOPROL-XL) 100 MG 24 hr tablet TAKE 1 TABLET ONCE DAILY.  Marland Kitchen Misc Natural Products (LUTEIN 20) CAPS Take 1 capsule by mouth daily.  . potassium chloride SA (K-DUR,KLOR-CON) 20 MEQ tablet Take 20 mEq by mouth daily.   . Probiotic Product (ALIGN PO) Take 1 capsule by mouth daily.  Marland Kitchen SYNTHROID 50 MCG tablet Take 50 mcg by mouth every other day.   Marland Kitchen  torsemide (DEMADEX) 20 MG tablet Take 20 mg by mouth every evening.   . triamcinolone cream (KENALOG) 0.1 %   . warfarin (COUMADIN) 3 MG tablet TAKE AS DIRECTED BY COUMADIN CLINIC.     Allergies:   Plendil [felodipine], Aspirin, Barbiturates, Eggs or egg-derived products, Epinephrine, Erythromycin base, Felodipine er, Iodinated diagnostic agents, Iodine, Morphine and related, Oxycodone hcl, Penicillins, Pravachol, Soy allergy, Sulfa antibiotics, Wheat, and Adhesive [tape]   Social History   Socioeconomic History  . Marital status: Single    Spouse name: Not on file  . Number of children: Not on file  . Years of education: Not on file  . Highest education level: Not on file  Occupational History  . Not on file  Social Needs  .  Financial resource strain: Not on file  . Food insecurity    Worry: Not on file    Inability: Not on file  . Transportation needs    Medical: Not on file    Non-medical: Not on file  Tobacco Use  . Smoking status: Never Smoker  . Smokeless tobacco: Never Used  Substance and Sexual Activity  . Alcohol use: No  . Drug use: No  . Sexual activity: Never    Birth control/protection: Post-menopausal  Lifestyle  . Physical activity    Days per week: Not on file    Minutes per session: Not on file  . Stress: Not on file  Relationships  . Social Herbalist on phone: Not on file    Gets together: Not on file    Attends religious service: Not on file    Active member of club or organization: Not on file    Attends meetings of clubs or organizations: Not on file    Relationship status: Not on file  Other Topics Concern  . Not on file  Social History Narrative  . Not on file     Family History: The patient's family history includes Cancer in her father; Heart disease in her maternal grandfather, maternal grandmother, and mother; Hypertension in her mother and sister; Peripheral Artery Disease in her sister. There is no history of Breast cancer.  ROS:   Please see the history of present illness.    Denies any fevers chills nausea vomiting syncope bleeding all other systems reviewed and are negative.  EKGs/Labs/Other Studies Reviewed:    The following studies were reviewed today: Echocardiogram 02/04/2018: - Left ventricle: The cavity size was normal. There was mild   concentric hypertrophy. Systolic function was normal. The   estimated ejection fraction was in the range of 60% to 65%. Wall   motion was normal; there were no regional wall motion   abnormalities. - Aortic valve: Trileaflet; severely thickened, severely calcified   leaflets. Right coronary cusp mobility was moderately restricted.   There was mild to moderate stenosis. Peak velocity (S): 316 cm/s.   Mean  gradient (S): 16 mm Hg. - Mitral valve: There was mild regurgitation. - Left atrium: The atrium was severely dilated. - Right atrium: The atrium was severely dilated. - Pulmonary arteries: PA peak pressure: 33 mm Hg (S).  Impressions:  - Normal LV EF. Thickened, calcified aortic valve with moderate   restriction of RCC. Gradients averaged due to atrial   fibrillation, but support mild-moderate AS. Mild MR. Severe   biatrial enlargement. Echocardiogram 2014 normal EF with moderate mitral regurgitation.  EKG:  EKG is ordered today.  The ekg ordered today demonstrates 12/17/2018-atrial fibrillation 83 with nonspecific T  wave changes-12/24/2017-atrial fibrillation heart rate 85 bpm with incomplete right bundle branch block, nonspecific ST-T wave changes personally reviewed and interpreted-prior from 12/20/2016 shows atrial fibrillation heart rate 79 bpm with nonspecific ST-T wave changes.  Personally reviewed and interpreted.  Recent Labs: No results found for requested labs within last 8760 hours.  Recent Lipid Panel No results found for: CHOL, TRIG, HDL, CHOLHDL, VLDL, LDLCALC, LDLDIRECT  Physical Exam:    VS:  BP 126/80   Pulse 89   Ht 5\' 3"  (1.6 m)   Wt 170 lb (77.1 kg)   SpO2 97%   BMI 30.11 kg/m     Wt Readings from Last 3 Encounters:  12/17/18 170 lb (77.1 kg)  12/24/17 165 lb 9.6 oz (75.1 kg)  12/20/16 161 lb 12.8 oz (73.4 kg)     GEN: Well nourished, well developed, in no acute distress  HEENT: normal  Neck: no JVD, carotid bruits, or masses Cardiac: irreg; 2/6 SM, no rubs, or gallops,no edema  Respiratory:  clear to auscultation bilaterally, normal work of breathing GI: soft, nontender, nondistended, + BS MS: no deformity or atrophy  Skin: warm and dry, no rash Neuro:  Alert and Oriented x 3, Strength and sensation are intact Psych: euthymic mood, full affect   ASSESSMENT:    1. Permanent atrial fibrillation ([place])   2. CKD (chronic kidney disease), stage IV  ([place])   3. Nonrheumatic aortic valve stenosis    PLAN:    In order of problems listed above:  Permanent atrial fibrillation/flutter -Good rate control on metoprolol and diltiazem.  No medication changes.  Doing very well.  Asymptomatic.  Chronic kidney disease stage III/IV -Creatinine has been approximately 1.4 to 1.6.  Dr. Laurann Montana has been monitoring.  No NSAIDs.  She is currently on ACE inhibitor.  Doing very well.  Chronic anticoagulation -Continue to monitor Coumadin closely.  No bleeding.  Last check was in range.  Moderate aortic stenosis mild LVH seen on prior echocardiogram - Seems to be doing quite well with symptoms.  Checking yearly.  02/04/2018 last echo.  Murmur heard on exam.  No indication at this point for replacement.  Insomnia -In the past has had a paradoxical response to Benadryl.  Melatonin makes her feel bad.  This in the past has been her main complaint.  Medication Adjustments/Labs and Tests Ordered: Current medicines are reviewed at length with the patient today.  Concerns regarding medicines are outlined above.  Orders Placed This Encounter  Procedures  . EKG 12-Lead   No orders of the defined types were placed in this encounter.   Patient Instructions  Medication Instructions:  Your physician recommends that you continue on your current medications as directed. Please refer to the Current Medication list given to you today. *If you need a refill on your cardiac medications before your next appointment, please call your pharmacy*  Lab Work: None If you have labs (blood work) drawn today and your tests are completely normal, you will receive your results only by: Marland Kitchen MyChart Message (if you have MyChart) OR . A paper copy in the mail If you have any lab test that is abnormal or we need to change your treatment, we will call you to review the results.  Testing/Procedures: Your physician has requested that you have an echocardiogram after February 05, 2019.Marland Kitchen Echocardiography is a painless test that uses sound waves to create images of your heart. It provides your doctor with information about the size and shape of your heart  and how well your heart's chambers and valves are working. This procedure takes approximately one hour. There are no restrictions for this procedure.    Follow-Up: At [facility], you and your health needs are our priority.  As part of our continuing mission to provide you with exceptional heart care, we have created designated Provider Care Teams.  These Care Teams include your primary Cardiologist (physician) and Advanced Practice Providers (APPs -  Physician Assistants and Nurse Practitioners) who all work together to provide you with the care you need, when you need it.  Your next appointment:   12 months  The format for your next appointment:   In Person  Provider:   You may see Candee Furbish, MD or one of the following Advanced Practice Providers on your designated Care Team:    Truitt Merle, NP  Cecilie Kicks, NP  Kathyrn Drown, NP   Other Instructions      Signed, Candee Furbish, MD  12/17/2018 9:26 AM    [place]

## 2018-12-17 NOTE — Patient Instructions (Signed)
Medication Instructions:  Your physician recommends that you continue on your current medications as directed. Please refer to the Current Medication list given to you today. *If you need a refill on your cardiac medications before your next appointment, please call your pharmacy*  Lab Work: None If you have labs (blood work) drawn today and your tests are completely normal, you will receive your results only by: Marland Kitchen MyChart Message (if you have MyChart) OR . A paper copy in the mail If you have any lab test that is abnormal or we need to change your treatment, we will call you to review the results.  Testing/Procedures: Your physician has requested that you have an echocardiogram after February 05, 2019.Marland Kitchen Echocardiography is a painless test that uses sound waves to create images of your heart. It provides your doctor with information about the size and shape of your heart and how well your heart's chambers and valves are working. This procedure takes approximately one hour. There are no restrictions for this procedure.    Follow-Up: At [facility], you and your health needs are our priority.  As part of our continuing mission to provide you with exceptional heart care, we have created designated Provider Care Teams.  These Care Teams include your primary Cardiologist (physician) and Advanced Practice Providers (APPs -  Physician Assistants and Nurse Practitioners) who all work together to provide you with the care you need, when you need it.  Your next appointment:   12 months  The format for your next appointment:   In Person  Provider:   You may see Candee Furbish, MD or one of the following Advanced Practice Providers on your designated Care Team:    Truitt Merle, NP  Cecilie Kicks, NP  Kathyrn Drown, NP   Other Instructions

## 2019-01-07 ENCOUNTER — Other Ambulatory Visit: Payer: Self-pay | Admitting: Cardiology

## 2019-01-28 ENCOUNTER — Other Ambulatory Visit: Payer: Self-pay

## 2019-01-28 ENCOUNTER — Ambulatory Visit (HOSPITAL_COMMUNITY): Payer: Medicare Other | Attending: Cardiovascular Disease

## 2019-01-28 ENCOUNTER — Ambulatory Visit (INDEPENDENT_AMBULATORY_CARE_PROVIDER_SITE_OTHER): Payer: Medicare Other | Admitting: *Deleted

## 2019-01-28 DIAGNOSIS — I340 Nonrheumatic mitral (valve) insufficiency: Secondary | ICD-10-CM | POA: Diagnosis not present

## 2019-01-28 DIAGNOSIS — Z5181 Encounter for therapeutic drug level monitoring: Secondary | ICD-10-CM

## 2019-01-28 LAB — POCT INR: INR: 3.2 — AB (ref 2.0–3.0)

## 2019-01-28 NOTE — Patient Instructions (Addendum)
Description   Hold today, then continue on same dosage 1/2 tablet daily.  Recheck INR in 3-4 weeks. Call with new medications-Coumadin clinic [phone].

## 2019-01-29 ENCOUNTER — Telehealth: Payer: Self-pay

## 2019-01-29 NOTE — Telephone Encounter (Signed)
-----   Message from Jerline Pain, MD sent at 01/29/2019  6:40 AM EST ----- Moderate aortic stenosis. Mild increase from last year. Normal pump function.  Repeat ECHO in one year to monitor. No indication for valve replacement at this time.  Candee Furbish, MD

## 2019-01-29 NOTE — Telephone Encounter (Signed)
LMTCB

## 2019-02-22 ENCOUNTER — Other Ambulatory Visit: Payer: Self-pay

## 2019-02-22 ENCOUNTER — Ambulatory Visit (INDEPENDENT_AMBULATORY_CARE_PROVIDER_SITE_OTHER): Payer: Medicare Other | Admitting: *Deleted

## 2019-02-22 DIAGNOSIS — Z5181 Encounter for therapeutic drug level monitoring: Secondary | ICD-10-CM

## 2019-02-22 LAB — POCT INR: INR: 3.5 — AB (ref 2.0–3.0)

## 2019-02-22 NOTE — Patient Instructions (Signed)
Description   Hold today, then continue on same dosage 1/2 tablet daily.  Recheck INR in 3 weeks. Call with new medications-Coumadin clinic [phone].

## 2019-03-23 ENCOUNTER — Other Ambulatory Visit: Payer: Self-pay

## 2019-03-23 ENCOUNTER — Encounter (INDEPENDENT_AMBULATORY_CARE_PROVIDER_SITE_OTHER): Payer: Self-pay

## 2019-03-23 ENCOUNTER — Ambulatory Visit (INDEPENDENT_AMBULATORY_CARE_PROVIDER_SITE_OTHER): Payer: Medicare Other | Admitting: *Deleted

## 2019-03-23 DIAGNOSIS — F419 Anxiety disorder, unspecified: Secondary | ICD-10-CM | POA: Diagnosis not present

## 2019-03-23 DIAGNOSIS — I5032 Chronic diastolic (congestive) heart failure: Secondary | ICD-10-CM | POA: Diagnosis not present

## 2019-03-23 DIAGNOSIS — I4892 Unspecified atrial flutter: Secondary | ICD-10-CM | POA: Diagnosis not present

## 2019-03-23 DIAGNOSIS — I129 Hypertensive chronic kidney disease with stage 1 through stage 4 chronic kidney disease, or unspecified chronic kidney disease: Secondary | ICD-10-CM | POA: Diagnosis not present

## 2019-03-23 DIAGNOSIS — I4819 Other persistent atrial fibrillation: Secondary | ICD-10-CM | POA: Diagnosis not present

## 2019-03-23 DIAGNOSIS — Z5181 Encounter for therapeutic drug level monitoring: Secondary | ICD-10-CM

## 2019-03-23 DIAGNOSIS — E039 Hypothyroidism, unspecified: Secondary | ICD-10-CM | POA: Diagnosis not present

## 2019-03-23 DIAGNOSIS — Z1389 Encounter for screening for other disorder: Secondary | ICD-10-CM | POA: Diagnosis not present

## 2019-03-23 DIAGNOSIS — M810 Age-related osteoporosis without current pathological fracture: Secondary | ICD-10-CM | POA: Diagnosis not present

## 2019-03-23 DIAGNOSIS — R197 Diarrhea, unspecified: Secondary | ICD-10-CM | POA: Diagnosis not present

## 2019-03-23 DIAGNOSIS — Z0000 Encounter for general adult medical examination without abnormal findings: Secondary | ICD-10-CM | POA: Diagnosis not present

## 2019-03-23 DIAGNOSIS — N184 Chronic kidney disease, stage 4 (severe): Secondary | ICD-10-CM | POA: Diagnosis not present

## 2019-03-23 LAB — POCT INR: INR: 3.7 — AB (ref 2.0–3.0)

## 2019-03-23 NOTE — Patient Instructions (Signed)
Description   Hold today's dose, then continue on same dosage 1/2 tablet daily. Please increase your dark leafy veggies weekly. Recheck INR in 3 weeks. Call with new medications-Coumadin clinic [phone].

## 2019-03-25 ENCOUNTER — Ambulatory Visit: Payer: Medicare Other

## 2019-04-01 ENCOUNTER — Ambulatory Visit: Payer: Medicare Other

## 2019-04-01 ENCOUNTER — Ambulatory Visit: Payer: Medicare Other | Attending: Internal Medicine

## 2019-04-01 DIAGNOSIS — Z23 Encounter for immunization: Secondary | ICD-10-CM | POA: Insufficient documentation

## 2019-04-01 NOTE — Progress Notes (Signed)
   Covid-19 Vaccination Clinic  Name:  [name]    MRN: [id number] DOB: May 10, 1931  04/01/2019  [name] was observed post Covid-19 immunization for 15 minutes without incidence. She was provided with Vaccine Information Sheet and instruction to access the V-Safe system.   [name] was instructed to call 911 with any severe reactions post vaccine: Marland Kitchen Difficulty breathing  . Swelling of your face and throat  . A fast heartbeat  . A bad rash all over your body  . Dizziness and weakness    Immunizations Administered    Name Date Dose VIS Date Route   Pfizer COVID-19 Vaccine 04/01/2019  9:49 AM 0.3 mL 02/05/2019 Intramuscular   Manufacturer: [place]   Lot: [id number]   [place]: [id number]

## 2019-04-08 ENCOUNTER — Other Ambulatory Visit: Payer: Self-pay | Admitting: Cardiology

## 2019-04-19 ENCOUNTER — Ambulatory Visit (INDEPENDENT_AMBULATORY_CARE_PROVIDER_SITE_OTHER): Payer: Medicare Other | Admitting: *Deleted

## 2019-04-19 ENCOUNTER — Other Ambulatory Visit: Payer: Self-pay

## 2019-04-19 DIAGNOSIS — Z5181 Encounter for therapeutic drug level monitoring: Secondary | ICD-10-CM

## 2019-04-19 LAB — POCT INR: INR: 3 (ref 2.0–3.0)

## 2019-04-19 NOTE — Patient Instructions (Addendum)
Description   Continue on same dosage 1/2 tablet daily. Recheck INR in 4 weeks. Call with new medications-Coumadin clinic [phone].

## 2019-04-26 ENCOUNTER — Ambulatory Visit: Payer: Medicare Other | Attending: Internal Medicine

## 2019-04-26 DIAGNOSIS — Z23 Encounter for immunization: Secondary | ICD-10-CM | POA: Insufficient documentation

## 2019-04-26 NOTE — Progress Notes (Signed)
   Covid-19 Vaccination Clinic  Name:  [name]    MRN: [id number] DOB: 1931-03-28  04/26/2019  [name] was observed post Covid-19 immunization for 15 minutes without incidence. She was provided with Vaccine Information Sheet and instruction to access the V-Safe system.   [name] was instructed to call 911 with any severe reactions post vaccine: Marland Kitchen Difficulty breathing  . Swelling of your face and throat  . A fast heartbeat  . A bad rash all over your body  . Dizziness and weakness    Immunizations Administered    Name Date Dose VIS Date Route   Pfizer COVID-19 Vaccine 04/26/2019  2:22 PM 0.3 mL 02/05/2019 Intramuscular   Manufacturer: [place]   Lot: [id number]   [place]: [id number]

## 2019-05-17 DIAGNOSIS — M810 Age-related osteoporosis without current pathological fracture: Secondary | ICD-10-CM | POA: Diagnosis not present

## 2019-05-20 ENCOUNTER — Other Ambulatory Visit: Payer: Self-pay

## 2019-05-20 ENCOUNTER — Ambulatory Visit (INDEPENDENT_AMBULATORY_CARE_PROVIDER_SITE_OTHER): Payer: Medicare Other | Admitting: *Deleted

## 2019-05-20 DIAGNOSIS — Z5181 Encounter for therapeutic drug level monitoring: Secondary | ICD-10-CM | POA: Diagnosis not present

## 2019-05-20 LAB — POCT INR: INR: 2.9 (ref 2.0–3.0)

## 2019-05-20 NOTE — Patient Instructions (Signed)
Description   Continue on same dosage 1/2 tablet daily. Recheck INR in 5 weeks. Call with new medications-Coumadin clinic [phone].

## 2019-05-26 ENCOUNTER — Telehealth: Payer: Self-pay | Admitting: Cardiology

## 2019-05-26 NOTE — Telephone Encounter (Signed)
Patient's daughter would like to have a better way for her mother to communicate with Dr. Marlou Porch because she is going to have some dental work coming up soon and will have to be on/off of her warfarin. She would like for the patient's dentist, PCP and Dr. Marlou Porch to all be on the same page. I advised her that MyChart was the best way to communicate and that the patient had not set it up yet. I resent the activation code to the daughter's email so that she could assist in setting it up. She will let us know if the patient is unable to use it.

## 2019-05-26 NOTE — Telephone Encounter (Signed)
Patient's daughter, Vinnie Level, called in to see if there was an email address for Dr.Skains that they could have to send messages to each other. She states that her mother is visually impaired and cannot use MyChart but is able to magnify emails. Please advise.

## 2019-05-27 DIAGNOSIS — H353232 Exudative age-related macular degeneration, bilateral, with inactive choroidal neovascularization: Secondary | ICD-10-CM | POA: Diagnosis not present

## 2019-05-27 DIAGNOSIS — Z961 Presence of intraocular lens: Secondary | ICD-10-CM | POA: Diagnosis not present

## 2019-05-27 DIAGNOSIS — H31013 Macula scars of posterior pole (postinflammatory) (post-traumatic), bilateral: Secondary | ICD-10-CM | POA: Diagnosis not present

## 2019-05-27 DIAGNOSIS — H43813 Vitreous degeneration, bilateral: Secondary | ICD-10-CM | POA: Diagnosis not present

## 2019-05-27 DIAGNOSIS — H353222 Exudative age-related macular degeneration, left eye, with inactive choroidal neovascularization: Secondary | ICD-10-CM | POA: Diagnosis not present

## 2019-05-27 DIAGNOSIS — H353212 Exudative age-related macular degeneration, right eye, with inactive choroidal neovascularization: Secondary | ICD-10-CM | POA: Diagnosis not present

## 2019-06-21 ENCOUNTER — Ambulatory Visit (INDEPENDENT_AMBULATORY_CARE_PROVIDER_SITE_OTHER): Payer: Medicare Other | Admitting: *Deleted

## 2019-06-21 ENCOUNTER — Other Ambulatory Visit: Payer: Self-pay

## 2019-06-21 DIAGNOSIS — Z5181 Encounter for therapeutic drug level monitoring: Secondary | ICD-10-CM

## 2019-06-21 LAB — POCT INR: INR: 3.7 — AB (ref 2.0–3.0)

## 2019-06-21 NOTE — Patient Instructions (Signed)
Description   Hold today and then Continue on same dosage 1/2 tablet daily. Recheck INR in 4 weeks, Pt is getting INR checked on 5/11 at her oral surgeons office.  Call with new medications-Coumadin clinic [phone].

## 2019-07-06 ENCOUNTER — Telehealth: Payer: Self-pay | Admitting: Cardiology

## 2019-07-06 DIAGNOSIS — D225 Melanocytic nevi of trunk: Secondary | ICD-10-CM | POA: Diagnosis not present

## 2019-07-06 DIAGNOSIS — L821 Other seborrheic keratosis: Secondary | ICD-10-CM | POA: Diagnosis not present

## 2019-07-06 DIAGNOSIS — D2272 Melanocytic nevi of left lower limb, including hip: Secondary | ICD-10-CM | POA: Diagnosis not present

## 2019-07-06 DIAGNOSIS — D2271 Melanocytic nevi of right lower limb, including hip: Secondary | ICD-10-CM | POA: Diagnosis not present

## 2019-07-06 DIAGNOSIS — D1801 Hemangioma of skin and subcutaneous tissue: Secondary | ICD-10-CM | POA: Diagnosis not present

## 2019-07-06 DIAGNOSIS — L814 Other melanin hyperpigmentation: Secondary | ICD-10-CM | POA: Diagnosis not present

## 2019-07-06 LAB — POCT INR: INR: 3.4 — AB (ref 2.0–3.0)

## 2019-07-06 NOTE — Telephone Encounter (Signed)
1. What dental office are you calling from? Dr Raj Janus   2. What is your office phone number?  [phone]  3. What is your fax number?    [phone]  4. What type of procedure is the patient having performed?  10 teeth extracted and smoothing of t he bone   5. What date is procedure scheduled or is the patient there now? 07-08-19*  (if the patient is at the dentist's office question goes to their cardiologist if he/she is in the office.  If not, question should go to the DOD).   6. What is your question (ex. Antibiotics prior to procedure, holding medication-we need to know how long dentist wants pt to hold med)? This morning INR was 3.4- pt will hold  Her Warfin tonight, tomorrow night and INR will be checked Thursday morning prior to extraction- pt wants to know if pt can have broccoli today and tomorrow    They would like for you to let them know before 4:30 today please

## 2019-07-06 NOTE — Telephone Encounter (Signed)
Patient with diagnosis of afib on warfarin for anticoagulation.    Procedure: 10 teeth extracted and smoothing of t he bone  Date of procedure: 07/08/19  CHADS2-VASc score of  4 ( HTN, AGE, AGE, female)  For 10 teeth extraction we would recommend holding warfarin. Usually would recommend a 3-5 day hold. If dentist is ok with a 2 day hold and he will check INR prior to procedure (to be sure INR is in a lower range, then I think that is acceptable.

## 2019-07-06 NOTE — Telephone Encounter (Signed)
   Primary Cardiologist:Mark Marlou Porch, MD  Chart reviewed as part of pre-operative protocol coverage. Pre-op clearance already addressed by colleagues in earlier phone notes. To summarize recommendations:  -Per MyChart Message 06/04/19 between patient's family and Dr. Marlou Porch, he cleared her to proceed with this procedure.  -Per pharmacist review, "For 10 teeth extraction we would recommend holding warfarin. Usually would recommend a 3-5 day hold. If dentist is ok with a 2 day hold and he will check INR prior to procedure (to be sure INR is in a lower range, then I think that is acceptable." We typically advise that blood thinners be resumed when felt safe by performing physician.  Will route to [facility] callback team to please relay to dental team since procedure is only 2 days away - also OK for patient to eat broccoli as requested.   Will also route this bundled recommendation to requesting provider via Epic fax function and remove from APP box. Please call with questions.  Charlie Pitter, PA-C 07/06/2019, 2:14 PM

## 2019-07-06 NOTE — Telephone Encounter (Signed)
I s/w dental office and went over recommendations provided by pre op team. I did also assure ok for the pt to eat broccoli. Dental office will check INR prior to dental procedure. I will remove from the the pre op call back pool.

## 2019-07-06 NOTE — Telephone Encounter (Signed)
   Primary Cardiologist: Candee Furbish, MD  Chart reviewed as part of pre-operative protocol coverage. Per MyChart Message 06/04/19 between patient's family and Dr. Marlou Porch, he cleared her to proceed with this procedure. See note below:  Ultimately, if she does need extensive dental surgery, multiple tooth extractions, she will likely need to be off of her anticoagulation during that surgical period.  She would likely restart perhaps 2 days after.  Her risk of stroke being off of the medications would be only minimally increased during this short duration of cessation.  I would defer to the dentist/oral surgeon whether or not it makes sense for her to have this procedure done in stages.  From a cardiology perspective, she should be able to tolerate this well.  Thanks,  Candee Furbish, MD     Will route to pharm for input ASAP on Coumadin plans; note below mentions INR today was 3.4 but do not see this in our records. Appreciate pharmacy input ASAP given that procedure is 2 days away.  Of note, chart reviewed and for completeness, no SBE prophylaxis needs identified.  Charlie Pitter, PA-C 07/06/2019, 12:14 PM

## 2019-07-07 ENCOUNTER — Ambulatory Visit (INDEPENDENT_AMBULATORY_CARE_PROVIDER_SITE_OTHER): Payer: Medicare Other | Admitting: Internal Medicine

## 2019-07-07 DIAGNOSIS — Z5181 Encounter for therapeutic drug level monitoring: Secondary | ICD-10-CM | POA: Diagnosis not present

## 2019-07-07 MED ORDER — WARFARIN SODIUM 1 MG PO TABS: ORAL_TABLET | ORAL | 0 refills | Status: DC

## 2019-07-07 NOTE — Patient Instructions (Addendum)
Description    Called and spoke to pt, instructed for pt to hold warfarin today (for procedure tomorrow) and then start taking 1.5 tablets daily except for 1 tablet on Sunday and Thursdays. Resume warfarin the night of procedure or as directed by physician.  Recheck INR in 1.5 weeks post procedure. Call with new medications-Coumadin clinic [phone].

## 2019-07-19 ENCOUNTER — Other Ambulatory Visit: Payer: Self-pay

## 2019-07-19 ENCOUNTER — Ambulatory Visit (INDEPENDENT_AMBULATORY_CARE_PROVIDER_SITE_OTHER): Payer: Medicare Other | Admitting: *Deleted

## 2019-07-19 DIAGNOSIS — Z5181 Encounter for therapeutic drug level monitoring: Secondary | ICD-10-CM | POA: Diagnosis not present

## 2019-07-19 DIAGNOSIS — I4892 Unspecified atrial flutter: Secondary | ICD-10-CM | POA: Diagnosis not present

## 2019-07-19 LAB — POCT INR: INR: 4.1 — AB (ref 2.0–3.0)

## 2019-07-19 MED ORDER — WARFARIN SODIUM 1 MG PO TABS: ORAL_TABLET | ORAL | 2 refills | Status: DC

## 2019-07-19 NOTE — Patient Instructions (Addendum)
Description   Hold warfarin today and then start taking 1.5 tablets daily except for 1 tablet on Sunday, Tuesdays and Thursdays.  Recheck INR in 2 weeks. Call with new medications-Coumadin clinic [phone].

## 2019-08-02 ENCOUNTER — Ambulatory Visit (INDEPENDENT_AMBULATORY_CARE_PROVIDER_SITE_OTHER): Payer: Medicare Other | Admitting: *Deleted

## 2019-08-02 ENCOUNTER — Other Ambulatory Visit: Payer: Self-pay

## 2019-08-02 DIAGNOSIS — Z5181 Encounter for therapeutic drug level monitoring: Secondary | ICD-10-CM

## 2019-08-02 LAB — POCT INR: INR: 3.6 — AB (ref 2.0–3.0)

## 2019-08-02 NOTE — Patient Instructions (Addendum)
Description   Hold warfarin today and then start taking 1 tablet daily except for 1.5 tablet on Mondays, Wednesdays and Fridays. Recheck INR in 2 weeks. Call with new medications-Coumadin clinic [phone].

## 2019-08-16 ENCOUNTER — Ambulatory Visit (INDEPENDENT_AMBULATORY_CARE_PROVIDER_SITE_OTHER): Payer: Medicare Other

## 2019-08-16 ENCOUNTER — Other Ambulatory Visit: Payer: Self-pay

## 2019-08-16 DIAGNOSIS — Z5181 Encounter for therapeutic drug level monitoring: Secondary | ICD-10-CM

## 2019-08-16 DIAGNOSIS — I4892 Unspecified atrial flutter: Secondary | ICD-10-CM

## 2019-08-16 LAB — POCT INR: INR: 3 (ref 2.0–3.0)

## 2019-08-16 NOTE — Patient Instructions (Signed)
Description   Continue on same dosage 1 tablet daily except for 1.5 tablet on Mondays, Wednesdays and Fridays. Recheck INR in 3-4 weeks. Call with new medications-Coumadin clinic [phone].

## 2019-09-13 ENCOUNTER — Other Ambulatory Visit: Payer: Self-pay

## 2019-09-13 ENCOUNTER — Ambulatory Visit (INDEPENDENT_AMBULATORY_CARE_PROVIDER_SITE_OTHER): Payer: Medicare Other

## 2019-09-13 DIAGNOSIS — Z5181 Encounter for therapeutic drug level monitoring: Secondary | ICD-10-CM

## 2019-09-13 DIAGNOSIS — I483 Typical atrial flutter: Secondary | ICD-10-CM | POA: Diagnosis not present

## 2019-09-13 LAB — POCT INR: INR: 3.2 — AB (ref 2.0–3.0)

## 2019-09-13 NOTE — Patient Instructions (Signed)
Description   Take 1 tablet today, then resume same dosage 1 tablet daily except for 1.5 tablet on Mondays, Wednesdays and Fridays. Recheck INR in 3-4 weeks. Call with new medications-Coumadin clinic [phone].

## 2019-09-23 DIAGNOSIS — M810 Age-related osteoporosis without current pathological fracture: Secondary | ICD-10-CM | POA: Diagnosis not present

## 2019-09-23 DIAGNOSIS — I5032 Chronic diastolic (congestive) heart failure: Secondary | ICD-10-CM | POA: Diagnosis not present

## 2019-09-23 DIAGNOSIS — M5431 Sciatica, right side: Secondary | ICD-10-CM | POA: Diagnosis not present

## 2019-09-23 DIAGNOSIS — N184 Chronic kidney disease, stage 4 (severe): Secondary | ICD-10-CM | POA: Diagnosis not present

## 2019-09-23 DIAGNOSIS — I129 Hypertensive chronic kidney disease with stage 1 through stage 4 chronic kidney disease, or unspecified chronic kidney disease: Secondary | ICD-10-CM | POA: Diagnosis not present

## 2019-10-01 ENCOUNTER — Other Ambulatory Visit: Payer: Self-pay | Admitting: Cardiology

## 2019-10-01 DIAGNOSIS — I4821 Permanent atrial fibrillation: Secondary | ICD-10-CM

## 2019-10-01 DIAGNOSIS — I483 Typical atrial flutter: Secondary | ICD-10-CM

## 2019-10-14 ENCOUNTER — Ambulatory Visit (INDEPENDENT_AMBULATORY_CARE_PROVIDER_SITE_OTHER): Payer: Medicare Other

## 2019-10-14 ENCOUNTER — Other Ambulatory Visit: Payer: Self-pay

## 2019-10-14 DIAGNOSIS — I4892 Unspecified atrial flutter: Secondary | ICD-10-CM

## 2019-10-14 DIAGNOSIS — Z5181 Encounter for therapeutic drug level monitoring: Secondary | ICD-10-CM | POA: Diagnosis not present

## 2019-10-14 LAB — POCT INR: INR: 3.5 — AB (ref 2.0–3.0)

## 2019-10-14 NOTE — Patient Instructions (Addendum)
Description   Skip today's dosage of Warfarin, then resume same dosage 1 tablet daily except for 1.5 tablet on Mondays, Wednesdays and Fridays. Recheck INR in 3-4 weeks. Increase your green leafy vegetables. Call with new medications-Coumadin clinic [phone].

## 2019-10-25 DIAGNOSIS — E7800 Pure hypercholesterolemia, unspecified: Secondary | ICD-10-CM | POA: Diagnosis not present

## 2019-10-25 DIAGNOSIS — I5032 Chronic diastolic (congestive) heart failure: Secondary | ICD-10-CM | POA: Diagnosis not present

## 2019-10-25 DIAGNOSIS — M810 Age-related osteoporosis without current pathological fracture: Secondary | ICD-10-CM | POA: Diagnosis not present

## 2019-10-25 DIAGNOSIS — N184 Chronic kidney disease, stage 4 (severe): Secondary | ICD-10-CM | POA: Diagnosis not present

## 2019-10-25 DIAGNOSIS — E039 Hypothyroidism, unspecified: Secondary | ICD-10-CM | POA: Diagnosis not present

## 2019-10-25 DIAGNOSIS — G4700 Insomnia, unspecified: Secondary | ICD-10-CM | POA: Diagnosis not present

## 2019-10-25 DIAGNOSIS — I129 Hypertensive chronic kidney disease with stage 1 through stage 4 chronic kidney disease, or unspecified chronic kidney disease: Secondary | ICD-10-CM | POA: Diagnosis not present

## 2019-10-25 DIAGNOSIS — I4819 Other persistent atrial fibrillation: Secondary | ICD-10-CM | POA: Diagnosis not present

## 2019-10-30 DIAGNOSIS — Z23 Encounter for immunization: Secondary | ICD-10-CM | POA: Diagnosis not present

## 2019-11-04 ENCOUNTER — Other Ambulatory Visit: Payer: Self-pay

## 2019-11-04 ENCOUNTER — Ambulatory Visit (INDEPENDENT_AMBULATORY_CARE_PROVIDER_SITE_OTHER): Payer: Medicare Other

## 2019-11-04 DIAGNOSIS — I4892 Unspecified atrial flutter: Secondary | ICD-10-CM | POA: Diagnosis not present

## 2019-11-04 DIAGNOSIS — Z5181 Encounter for therapeutic drug level monitoring: Secondary | ICD-10-CM | POA: Diagnosis not present

## 2019-11-04 LAB — POCT INR: INR: 3.9 — AB (ref 2.0–3.0)

## 2019-11-04 NOTE — Patient Instructions (Signed)
Description   Skip today's dosage of Warfarin, then start taking 1 tablet daily except for 1.5 tablet on Mondays and Fridays. Recheck INR in 2-3 weeks. Increase your green leafy vegetables. Call with new medications-Coumadin clinic [phone].

## 2019-11-08 ENCOUNTER — Other Ambulatory Visit: Payer: Self-pay | Admitting: Cardiology

## 2019-11-08 DIAGNOSIS — I483 Typical atrial flutter: Secondary | ICD-10-CM

## 2019-11-08 DIAGNOSIS — I4821 Permanent atrial fibrillation: Secondary | ICD-10-CM

## 2019-11-18 ENCOUNTER — Other Ambulatory Visit: Payer: Self-pay

## 2019-11-18 ENCOUNTER — Ambulatory Visit (INDEPENDENT_AMBULATORY_CARE_PROVIDER_SITE_OTHER): Payer: Medicare Other | Admitting: *Deleted

## 2019-11-18 DIAGNOSIS — Z5181 Encounter for therapeutic drug level monitoring: Secondary | ICD-10-CM | POA: Diagnosis not present

## 2019-11-18 DIAGNOSIS — I4892 Unspecified atrial flutter: Secondary | ICD-10-CM | POA: Diagnosis not present

## 2019-11-18 DIAGNOSIS — M810 Age-related osteoporosis without current pathological fracture: Secondary | ICD-10-CM | POA: Diagnosis not present

## 2019-11-18 LAB — POCT INR: INR: 2.8 (ref 2.0–3.0)

## 2019-11-18 NOTE — Patient Instructions (Signed)
Description   Continue taking Warfarin 1 tablet daily except for 1.5 tablet on Mondays and Fridays. Recheck INR in 2 weeks. Continue green leafy vegetables. Call with new medications-Coumadin clinic [phone].

## 2019-12-07 ENCOUNTER — Other Ambulatory Visit: Payer: Self-pay

## 2019-12-07 ENCOUNTER — Ambulatory Visit (INDEPENDENT_AMBULATORY_CARE_PROVIDER_SITE_OTHER): Payer: Medicare Other | Admitting: *Deleted

## 2019-12-07 DIAGNOSIS — Z5181 Encounter for therapeutic drug level monitoring: Secondary | ICD-10-CM

## 2019-12-07 LAB — POCT INR: INR: 2.7 (ref 2.0–3.0)

## 2019-12-07 NOTE — Patient Instructions (Signed)
Description   Continue taking Warfarin 1 tablet daily except for 1.5 tablet on Mondays and Fridays. Recheck INR in 4 weeks- per pt request Continue green leafy vegetables. Call with new medications-Coumadin clinic [phone].

## 2019-12-11 DIAGNOSIS — Z23 Encounter for immunization: Secondary | ICD-10-CM | POA: Diagnosis not present

## 2019-12-17 ENCOUNTER — Ambulatory Visit: Payer: Medicare Other | Admitting: Cardiology

## 2019-12-22 DIAGNOSIS — E039 Hypothyroidism, unspecified: Secondary | ICD-10-CM | POA: Diagnosis not present

## 2019-12-22 DIAGNOSIS — I129 Hypertensive chronic kidney disease with stage 1 through stage 4 chronic kidney disease, or unspecified chronic kidney disease: Secondary | ICD-10-CM | POA: Diagnosis not present

## 2019-12-22 DIAGNOSIS — I5032 Chronic diastolic (congestive) heart failure: Secondary | ICD-10-CM | POA: Diagnosis not present

## 2019-12-22 DIAGNOSIS — E7800 Pure hypercholesterolemia, unspecified: Secondary | ICD-10-CM | POA: Diagnosis not present

## 2019-12-22 DIAGNOSIS — G4700 Insomnia, unspecified: Secondary | ICD-10-CM | POA: Diagnosis not present

## 2019-12-28 ENCOUNTER — Ambulatory Visit: Payer: Medicare Other | Admitting: Cardiology

## 2020-01-03 ENCOUNTER — Other Ambulatory Visit: Payer: Self-pay | Admitting: Cardiology

## 2020-01-05 ENCOUNTER — Ambulatory Visit (INDEPENDENT_AMBULATORY_CARE_PROVIDER_SITE_OTHER): Payer: Medicare Other | Admitting: *Deleted

## 2020-01-05 ENCOUNTER — Other Ambulatory Visit: Payer: Self-pay

## 2020-01-05 DIAGNOSIS — Z5181 Encounter for therapeutic drug level monitoring: Secondary | ICD-10-CM | POA: Diagnosis not present

## 2020-01-05 LAB — POCT INR: INR: 3.3 — AB (ref 2.0–3.0)

## 2020-01-05 NOTE — Patient Instructions (Addendum)
Description   Hold warfarin today and then continue taking Warfarin 1 tablet daily except for 1.5 tablet on Mondays and Fridays. Recheck INR in 3 weeks. Continue green leafy vegetables. Call with new medications-Coumadin clinic [phone].

## 2020-01-13 ENCOUNTER — Other Ambulatory Visit: Payer: Self-pay

## 2020-01-13 ENCOUNTER — Encounter: Payer: Self-pay | Admitting: Cardiology

## 2020-01-13 ENCOUNTER — Ambulatory Visit (INDEPENDENT_AMBULATORY_CARE_PROVIDER_SITE_OTHER): Payer: Medicare Other | Admitting: Cardiology

## 2020-01-13 VITALS — BP 120/70 | HR 78 | Ht 63.0 in | Wt 153.0 lb

## 2020-01-13 DIAGNOSIS — I4821 Permanent atrial fibrillation: Secondary | ICD-10-CM | POA: Diagnosis not present

## 2020-01-13 DIAGNOSIS — Z7901 Long term (current) use of anticoagulants: Secondary | ICD-10-CM | POA: Diagnosis not present

## 2020-01-13 DIAGNOSIS — I350 Nonrheumatic aortic (valve) stenosis: Secondary | ICD-10-CM | POA: Diagnosis not present

## 2020-01-13 NOTE — Progress Notes (Signed)
Cardiology Office Note:    Date:  01/13/2020   ID:  [name], DOB 1931/12/07, MRN [id number]  PCP:  Lavone Orn, MD  [facility] Cardiologist:  Candee Furbish, MD  [facility] Electrophysiologist:  None   Referring MD: Lavone Orn, MD     History of Present Illness:    [name] is a 86 y.o. female here for the follow-up of atrial fibrillation/atrial flutter.  Has had a few eye procedures recently with legal blindness and macular degeneration.  Stated that she did have some bleeding noted in her retina but in an already damaged part with macular degeneration.  Chronic lower extremity edema also.  Fairly stable.  Denies any fevers chills nausea vomiting syncope bleeding.  Previously told me about her cat named Fuji who has been trained to sit and jump on her shoulders. Stands on her at night if she wants something. Loves broccoli. Husband was a Psychologist, clinical. Chicken, geese, swan.  She also told me a story about her to [place].  She had to give 1 medication and the bird would not release her finger.  She also told me how they said there feathers once a year.  Incline increased HR will taking the garbage out.  She is now having someone help her with this.  Overall no anginal symptoms.   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[place])    Chemotherapy, Magrinat and Clarke-Pearson   Macular degeneration    Migraine headache    Improved after menopause   Mitral regurgitation 11/27/2012   Renal disorder    Shortness of breath    Shortness of breath    Cardiology Eval., possible  diastolic dysfunctio   UTI (urinary tract infection)    Frequent   Visual loss, right eye    Left ocular accident   Vitamin D deficiency    Wrist fracture, right 12/2009    Past Surgical History:  Procedure Laterality Date   BLEPHAROPLASTY     Cataract Surgery Bilateral    NERVE BIOPSY     TONSILLECTOMY     TOTAL ABDOMINAL HYSTERECTOMY W/ BILATERAL SALPINGOOPHORECTOMY  01/2012   , Partial resection of the sigmoid colon    Current Medications: Current Meds  Medication Sig   Cholecalciferol (VITAMIN D) 2000 UNITS tablet Take 2,000 Units by mouth daily.   ciprofloxacin (CIPRO) 250 MG tablet Take 250 mg by mouth 2 (two) times daily as needed (for cystitis symptoms). For symptoms    diltiazem (CARDIZEM CD) 180 MG 24 hr capsule TAKE 1 CAPSULE IN THE MORNING ON AN EMPTY STOMACH.   fluocinonide (LIDEX) 0.05 % external solution    levothyroxine (SYNTHROID, LEVOTHROID) 75 MCG tablet Take 75 mcg by mouth every other day.    lisinopril (PRINIVIL,ZESTRIL) 10 MG tablet Take 10 mg by mouth daily.   loratadine (CLARITIN) 10 MG tablet Take 10 mg by mouth daily as needed for allergies.    metoprolol succinate (TOPROL-XL) 100 MG 24 hr tablet TAKE 1 TABLET ONCE DAILY.   Misc Natural Products (  LUTEIN 20) CAPS Take 1 capsule by mouth daily.   potassium chloride SA (K-DUR,KLOR-CON) 20 MEQ tablet Take 20 mEq by mouth daily.    Probiotic Product (ALIGN PO) Take 1 capsule by mouth daily.   SYNTHROID 50 MCG tablet Take 50 mcg by mouth every other day.    torsemide (DEMADEX) 20 MG tablet Take 20 mg by mouth every evening.    triamcinolone cream (KENALOG) 0.1 %    warfarin (COUMADIN) 1 MG tablet TAKE 1 TO 1.5 TABLETS DAILY AS DIRECTED BY COUMADIN CLINIC     Allergies:   Plendil [felodipine], Aspirin, Barbiturates, Eggs or egg-derived products, Epinephrine, Erythromycin base, Felodipine er, Iodinated diagnostic agents, Iodine, Morphine and related, Oxycodone hcl, Penicillins,  Pravachol, Soy allergy, Sulfa antibiotics, Wheat, and Adhesive [tape]   Social History   Socioeconomic History   Marital status: Single    Spouse name: Not on file   Number of children: Not on file   Years of education: Not on file   Highest education level: Not on file  Occupational History   Not on file  Tobacco Use   Smoking status: Never Smoker   Smokeless tobacco: Never Used  Vaping Use   Vaping Use: Never used  Substance and Sexual Activity   Alcohol use: No   Drug use: No   Sexual activity: Never    Birth control/protection: Post-menopausal  Other Topics Concern   Not on file  Social History Narrative   Not on file   Social Determinants of Health   Financial Resource Strain:    Difficulty of Paying Living Expenses: Not on file  Food Insecurity:    Worried About Estate manager/land agent of Food in the Last Year: Not on file   [employer] of Food in the Last Year: Not on file  Transportation Needs:    Lack of Transportation (Medical): Not on file   Lack of Transportation (Non-Medical): Not on file  Physical Activity:    Days of Exercise per Week: Not on file   Minutes of Exercise per Session: Not on file  Stress:    Feeling of Stress : Not on file  Social Connections:    Frequency of Communication with Friends and Family: Not on file   Frequency of Social Gatherings with Friends and Family: Not on file   Attends Religious Services: Not on file   Active Member of Clubs or Organizations: Not on file   Attends Archivist Meetings: Not on file   Marital Status: Not on file     Family History: The patient's family history includes Cancer in her father; Heart disease in her maternal grandfather, maternal grandmother, and mother; Hypertension in her mother and sister; Peripheral Artery Disease in her sister. There is no history of Breast cancer.  ROS:   Please see the history of present illness.    No syncope, no major bleeding no fevers no  chills no nausea no vomiting.  All other systems reviewed and are negative.  EKGs/Labs/Other Studies Reviewed:    The following studies were reviewed today:  ECHO 01/28/2019:  1. Left ventricular ejection fraction, by visual estimation, is 60 to  65%. The left ventricle has normal function. There is no left ventricular  hypertrophy.  2. Left ventricular diastolic function could not be evaluated.  3. Global right ventricle has normal systolic function.The right  ventricular size is normal. No increase in right ventricular wall  thickness.  4. Left atrial size was severely dilated.  5. Right atrial size  was severely dilated.  6. Mild mitral annular calcification.  7. The mitral valve is normal in structure. Mild mitral valve  regurgitation. No evidence of mitral stenosis.  8. The tricuspid valve is normal in structure. Tricuspid valve  regurgitation is mild.  9. The aortic valve is normal in structure. Aortic valve regurgitation is  not visualized. Moderate aortic valve stenosis.  10. There is Moderate calcification of the aortic valve.  11. There is Moderate thickening of the aortic valve.  12. The pulmonic valve was normal in structure. Pulmonic valve  regurgitation is not visualized.  13. There is mild dilatation of the ascending aorta.  14. Normal pulmonary artery systolic pressure.  15. The inferior vena cava is normal in size with greater than 50%  respiratory variability, suggesting right atrial pressure of 3 mmHg.   Echocardiogram 02/04/2018: - Left ventricle: The cavity size was normal. There was mild concentric hypertrophy. Systolic function was normal. The estimated ejection fraction was in the range of 60% to 65%. Wall motion was normal; there were no regional wall motion abnormalities. - Aortic valve: Trileaflet; severely thickened, severely calcified leaflets. Right coronary cusp mobility was moderately restricted. There was mild to moderate  stenosis. Peak velocity (S): 316 cm/s. Mean gradient (S): 16 mm Hg. - Mitral valve: There was mild regurgitation. - Left atrium: The atrium was severely dilated. - Right atrium: The atrium was severely dilated. - Pulmonary arteries: PA peak pressure: 33 mm Hg (S).  Impressions:  - Normal LV EF. Thickened, calcified aortic valve with moderate restriction of RCC. Gradients averaged due to atrial fibrillation, but support mild-moderate AS. Mild MR. Severe biatrial enlargement. Echocardiogram 2014 normal EF with moderate mitral regurgitation  EKG:  EKG is  ordered today.  The ekg ordered today demonstrates atrial fibrillation 78 bpm left axis deviation  Recent Labs: No results found for requested labs within last 8760 hours.  Recent Lipid Panel No results found for: CHOL, TRIG, HDL, CHOLHDL, VLDL, LDLCALC, LDLDIRECT    Physical Exam:    VS:  BP 120/70    Pulse 78    Ht 5\' 3"  (1.6 m)    Wt 153 lb (69.4 kg)    SpO2 98%    BMI 27.10 kg/m     Wt Readings from Last 3 Encounters:  01/13/20 153 lb (69.4 kg)  12/17/18 170 lb (77.1 kg)  12/24/17 165 lb 9.6 oz (75.1 kg)     GEN:  Well nourished, well developed in no acute distress HEENT: Normal NECK: No JVD; No carotid bruits LYMPHATICS: No lymphadenopathy CARDIAC: irreg, 2/6 SM, no rubs, gallops RESPIRATORY:  Clear to auscultation without rales, wheezing or rhonchi  ABDOMEN: Soft, non-tender, non-distended MUSCULOSKELETAL:  No edema; No deformity  SKIN: Warm and dry NEUROLOGIC:  Alert and oriented x 3 PSYCHIATRIC:  Normal affect   ASSESSMENT:    1. Permanent atrial fibrillation ([place])   2. Chronic anticoagulation   3. Nonrheumatic aortic valve stenosis    PLAN:    In order of problems listed above:  Longstanding persistent atrial fibrillation/flutter -Continue with good rate control on metoprolol and diltiazem.  No changes made today.  Asymptomatic with this.  She just had some minor issues when going up an  incline.  She states that she can walk all day long on flat ground.  Chronic anticoagulation -On Coumadin.  Monitoring lab work closely in our pharmacy.  At last check, INR 3.3, dosing adjusted.  Hemoglobin 14.8, creatinine 1.62.  Moderate aortic stenosis with mild LVH -  Seems to doing quite well with this.  Echocardiogram reviewed as above.  Continuing to monitor.  Murmur heard on exam.    Shared Decision Making/Informed Consent        Medication Adjustments/Labs and Tests Ordered: Current medicines are reviewed at length with the patient today.  Concerns regarding medicines are outlined above.  Orders Placed This Encounter  Procedures   EKG 12-Lead   No orders of the defined types were placed in this encounter.   Patient Instructions  Medication Instructions:  The current medical regimen is effective;  continue present plan and medications.  *If you need a refill on your cardiac medications before your next appointment, please call your pharmacy*  Follow-Up: At [facility], you and your health needs are our priority.  As part of our continuing mission to provide you with exceptional heart care, we have created designated Provider Care Teams.  These Care Teams include your primary Cardiologist (physician) and Advanced Practice Providers (APPs -  Physician Assistants and Nurse Practitioners) who all work together to provide you with the care you need, when you need it.  We recommend signing up for the patient portal called "MyChart".  Sign up information is provided on this After Visit Summary.  MyChart is used to connect with patients for Virtual Visits (Telemedicine).  Patients are able to view lab/test results, encounter notes, upcoming appointments, etc.  Non-urgent messages can be sent to your provider as well.   To learn more about what you can do with MyChart, go to [email or web address].    Your next appointment:   12 month(s)  The format for your next appointment:    In Person  Provider:   Candee Furbish, MD   Thank you for choosing [facility]!!        Signed, Candee Furbish, MD  01/13/2020 10:29 AM    [place]

## 2020-01-13 NOTE — Patient Instructions (Signed)
Medication Instructions:  The current medical regimen is effective;  continue present plan and medications.  *If you need a refill on your cardiac medications before your next appointment, please call your pharmacy*  Follow-Up: At [facility], you and your health needs are our priority.  As part of our continuing mission to provide you with exceptional heart care, we have created designated Provider Care Teams.  These Care Teams include your primary Cardiologist (physician) and Advanced Practice Providers (APPs -  Physician Assistants and Nurse Practitioners) who all work together to provide you with the care you need, when you need it.  We recommend signing up for the patient portal called "MyChart".  Sign up information is provided on this After Visit Summary.  MyChart is used to connect with patients for Virtual Visits (Telemedicine).  Patients are able to view lab/test results, encounter notes, upcoming appointments, etc.  Non-urgent messages can be sent to your provider as well.   To learn more about what you can do with MyChart, go to [email or web address].    Your next appointment:   12 month(s)  The format for your next appointment:   In Person  Provider:   Mark Skains, MD   Thank you for choosing [facility]!!      

## 2020-02-02 ENCOUNTER — Other Ambulatory Visit: Payer: Self-pay | Admitting: Interventional Cardiology

## 2020-02-02 ENCOUNTER — Other Ambulatory Visit: Payer: Self-pay | Admitting: Cardiology

## 2020-02-02 DIAGNOSIS — I4821 Permanent atrial fibrillation: Secondary | ICD-10-CM

## 2020-02-02 DIAGNOSIS — I483 Typical atrial flutter: Secondary | ICD-10-CM

## 2020-02-02 MED ORDER — DILTIAZEM HCL ER COATED BEADS 180 MG PO CP24
180.0000 mg | ORAL_CAPSULE | Freq: Every morning | ORAL | 3 refills | Status: DC
Start: 2020-02-02 — End: 2021-01-23

## 2020-02-02 MED ORDER — METOPROLOL SUCCINATE ER 100 MG PO TB24
100.0000 mg | ORAL_TABLET | Freq: Every day | ORAL | 3 refills | Status: DC
Start: 2020-02-02 — End: 2021-01-23

## 2020-02-02 NOTE — Addendum Note (Signed)
Addended by: Carter Kitten D on: 02/02/2020 05:03 PM   Modules accepted: Orders

## 2020-02-03 ENCOUNTER — Ambulatory Visit (INDEPENDENT_AMBULATORY_CARE_PROVIDER_SITE_OTHER): Payer: Medicare Other | Admitting: *Deleted

## 2020-02-03 ENCOUNTER — Other Ambulatory Visit: Payer: Self-pay

## 2020-02-03 DIAGNOSIS — I4892 Unspecified atrial flutter: Secondary | ICD-10-CM | POA: Diagnosis not present

## 2020-02-03 DIAGNOSIS — H31013 Macula scars of posterior pole (postinflammatory) (post-traumatic), bilateral: Secondary | ICD-10-CM | POA: Diagnosis not present

## 2020-02-03 DIAGNOSIS — H353222 Exudative age-related macular degeneration, left eye, with inactive choroidal neovascularization: Secondary | ICD-10-CM | POA: Diagnosis not present

## 2020-02-03 DIAGNOSIS — Z5181 Encounter for therapeutic drug level monitoring: Secondary | ICD-10-CM | POA: Diagnosis not present

## 2020-02-03 DIAGNOSIS — H353232 Exudative age-related macular degeneration, bilateral, with inactive choroidal neovascularization: Secondary | ICD-10-CM | POA: Diagnosis not present

## 2020-02-03 DIAGNOSIS — H353212 Exudative age-related macular degeneration, right eye, with inactive choroidal neovascularization: Secondary | ICD-10-CM | POA: Diagnosis not present

## 2020-02-03 DIAGNOSIS — Z961 Presence of intraocular lens: Secondary | ICD-10-CM | POA: Diagnosis not present

## 2020-02-03 DIAGNOSIS — H43813 Vitreous degeneration, bilateral: Secondary | ICD-10-CM | POA: Diagnosis not present

## 2020-02-03 LAB — POCT INR: INR: 2.1 (ref 2.0–3.0)

## 2020-02-03 NOTE — Patient Instructions (Signed)
Description   Continue taking Warfarin 1 tablet daily except for 1.5 tablets on Mondays and Fridays. Recheck INR in 4 weeks. Continue green leafy vegetables. Call with new medications-Coumadin clinic [phone].

## 2020-03-02 ENCOUNTER — Other Ambulatory Visit: Payer: Self-pay

## 2020-03-02 ENCOUNTER — Ambulatory Visit (INDEPENDENT_AMBULATORY_CARE_PROVIDER_SITE_OTHER): Payer: Medicare Other | Admitting: *Deleted

## 2020-03-02 DIAGNOSIS — Z5181 Encounter for therapeutic drug level monitoring: Secondary | ICD-10-CM | POA: Diagnosis not present

## 2020-03-02 LAB — POCT INR: INR: 1.7 — AB (ref 2.0–3.0)

## 2020-03-02 NOTE — Patient Instructions (Signed)
Description   Take 1.5 tablets today and then continue taking Warfarin 1 tablet daily except for 1.5 tablets on Mondays and Fridays. Recheck INR in 4 weeks- per pt request. Continue green leafy vegetables. Call with new medications-Coumadin clinic [phone].

## 2020-03-08 DIAGNOSIS — I129 Hypertensive chronic kidney disease with stage 1 through stage 4 chronic kidney disease, or unspecified chronic kidney disease: Secondary | ICD-10-CM | POA: Diagnosis not present

## 2020-03-08 DIAGNOSIS — E7800 Pure hypercholesterolemia, unspecified: Secondary | ICD-10-CM | POA: Diagnosis not present

## 2020-03-08 DIAGNOSIS — I482 Chronic atrial fibrillation, unspecified: Secondary | ICD-10-CM | POA: Diagnosis not present

## 2020-03-08 DIAGNOSIS — E039 Hypothyroidism, unspecified: Secondary | ICD-10-CM | POA: Diagnosis not present

## 2020-03-08 DIAGNOSIS — G4700 Insomnia, unspecified: Secondary | ICD-10-CM | POA: Diagnosis not present

## 2020-03-08 DIAGNOSIS — I10 Essential (primary) hypertension: Secondary | ICD-10-CM | POA: Diagnosis not present

## 2020-03-08 DIAGNOSIS — M810 Age-related osteoporosis without current pathological fracture: Secondary | ICD-10-CM | POA: Diagnosis not present

## 2020-03-08 DIAGNOSIS — I5032 Chronic diastolic (congestive) heart failure: Secondary | ICD-10-CM | POA: Diagnosis not present

## 2020-03-08 DIAGNOSIS — N184 Chronic kidney disease, stage 4 (severe): Secondary | ICD-10-CM | POA: Diagnosis not present

## 2020-03-27 ENCOUNTER — Other Ambulatory Visit: Payer: Self-pay

## 2020-03-27 ENCOUNTER — Ambulatory Visit (INDEPENDENT_AMBULATORY_CARE_PROVIDER_SITE_OTHER): Payer: Medicare Other | Admitting: *Deleted

## 2020-03-27 DIAGNOSIS — Z1389 Encounter for screening for other disorder: Secondary | ICD-10-CM | POA: Diagnosis not present

## 2020-03-27 DIAGNOSIS — Z7189 Other specified counseling: Secondary | ICD-10-CM | POA: Diagnosis not present

## 2020-03-27 DIAGNOSIS — I482 Chronic atrial fibrillation, unspecified: Secondary | ICD-10-CM | POA: Diagnosis not present

## 2020-03-27 DIAGNOSIS — N390 Urinary tract infection, site not specified: Secondary | ICD-10-CM | POA: Diagnosis not present

## 2020-03-27 DIAGNOSIS — I5032 Chronic diastolic (congestive) heart failure: Secondary | ICD-10-CM | POA: Diagnosis not present

## 2020-03-27 DIAGNOSIS — Z5181 Encounter for therapeutic drug level monitoring: Secondary | ICD-10-CM | POA: Diagnosis not present

## 2020-03-27 DIAGNOSIS — M810 Age-related osteoporosis without current pathological fracture: Secondary | ICD-10-CM | POA: Diagnosis not present

## 2020-03-27 DIAGNOSIS — M5432 Sciatica, left side: Secondary | ICD-10-CM | POA: Diagnosis not present

## 2020-03-27 DIAGNOSIS — E039 Hypothyroidism, unspecified: Secondary | ICD-10-CM | POA: Diagnosis not present

## 2020-03-27 DIAGNOSIS — Z0000 Encounter for general adult medical examination without abnormal findings: Secondary | ICD-10-CM | POA: Diagnosis not present

## 2020-03-27 DIAGNOSIS — I129 Hypertensive chronic kidney disease with stage 1 through stage 4 chronic kidney disease, or unspecified chronic kidney disease: Secondary | ICD-10-CM | POA: Diagnosis not present

## 2020-03-27 DIAGNOSIS — N184 Chronic kidney disease, stage 4 (severe): Secondary | ICD-10-CM | POA: Diagnosis not present

## 2020-03-27 LAB — POCT INR: INR: 2.1 (ref 2.0–3.0)

## 2020-03-27 NOTE — Patient Instructions (Signed)
Description   Continue taking Warfarin 1 tablet daily except for 1.5 tablets on Mondays and Fridays. Recheck INR in 4 weeks.  Continue green leafy vegetables. Call with new medications-Coumadin clinic [phone].

## 2020-04-09 ENCOUNTER — Other Ambulatory Visit: Payer: Self-pay | Admitting: Cardiology

## 2020-04-09 DIAGNOSIS — I483 Typical atrial flutter: Secondary | ICD-10-CM

## 2020-04-09 DIAGNOSIS — I4821 Permanent atrial fibrillation: Secondary | ICD-10-CM

## 2020-04-24 ENCOUNTER — Ambulatory Visit (INDEPENDENT_AMBULATORY_CARE_PROVIDER_SITE_OTHER): Payer: Medicare Other | Admitting: *Deleted

## 2020-04-24 ENCOUNTER — Other Ambulatory Visit: Payer: Self-pay

## 2020-04-24 DIAGNOSIS — Z5181 Encounter for therapeutic drug level monitoring: Secondary | ICD-10-CM | POA: Diagnosis not present

## 2020-04-24 LAB — POCT INR: INR: 2.4 (ref 2.0–3.0)

## 2020-04-24 NOTE — Patient Instructions (Signed)
Description   Continue taking Warfarin 1 tablet daily except for 1.5 tablets on Mondays and Fridays. Recheck INR in 5 weeks.  Continue green leafy vegetables. Call with new medications-Coumadin clinic [phone].

## 2020-05-29 ENCOUNTER — Ambulatory Visit (INDEPENDENT_AMBULATORY_CARE_PROVIDER_SITE_OTHER): Payer: Medicare Other

## 2020-05-29 ENCOUNTER — Other Ambulatory Visit: Payer: Self-pay

## 2020-05-29 DIAGNOSIS — Z5181 Encounter for therapeutic drug level monitoring: Secondary | ICD-10-CM

## 2020-05-29 DIAGNOSIS — M810 Age-related osteoporosis without current pathological fracture: Secondary | ICD-10-CM | POA: Diagnosis not present

## 2020-05-29 DIAGNOSIS — I483 Typical atrial flutter: Secondary | ICD-10-CM | POA: Diagnosis not present

## 2020-05-29 LAB — POCT INR: INR: 2.3 (ref 2.0–3.0)

## 2020-05-29 NOTE — Patient Instructions (Signed)
-   Continue taking Warfarin 1 tablet daily except for 1.5 tablets on Mondays and Fridays.  - Recheck INR in 6 weeks.   - Continue green leafy vegetables.  Call with new medications-Coumadin clinic [phone].

## 2020-06-12 DIAGNOSIS — Z23 Encounter for immunization: Secondary | ICD-10-CM | POA: Diagnosis not present

## 2020-06-16 DIAGNOSIS — E039 Hypothyroidism, unspecified: Secondary | ICD-10-CM | POA: Diagnosis not present

## 2020-06-16 DIAGNOSIS — E7800 Pure hypercholesterolemia, unspecified: Secondary | ICD-10-CM | POA: Diagnosis not present

## 2020-06-16 DIAGNOSIS — M810 Age-related osteoporosis without current pathological fracture: Secondary | ICD-10-CM | POA: Diagnosis not present

## 2020-06-16 DIAGNOSIS — G4700 Insomnia, unspecified: Secondary | ICD-10-CM | POA: Diagnosis not present

## 2020-06-16 DIAGNOSIS — I10 Essential (primary) hypertension: Secondary | ICD-10-CM | POA: Diagnosis not present

## 2020-06-16 DIAGNOSIS — I482 Chronic atrial fibrillation, unspecified: Secondary | ICD-10-CM | POA: Diagnosis not present

## 2020-06-16 DIAGNOSIS — I129 Hypertensive chronic kidney disease with stage 1 through stage 4 chronic kidney disease, or unspecified chronic kidney disease: Secondary | ICD-10-CM | POA: Diagnosis not present

## 2020-06-16 DIAGNOSIS — I5032 Chronic diastolic (congestive) heart failure: Secondary | ICD-10-CM | POA: Diagnosis not present

## 2020-06-27 ENCOUNTER — Other Ambulatory Visit: Payer: Self-pay | Admitting: Cardiology

## 2020-06-27 DIAGNOSIS — I4821 Permanent atrial fibrillation: Secondary | ICD-10-CM

## 2020-06-27 DIAGNOSIS — I483 Typical atrial flutter: Secondary | ICD-10-CM

## 2020-07-10 ENCOUNTER — Other Ambulatory Visit: Payer: Self-pay

## 2020-07-10 ENCOUNTER — Ambulatory Visit (INDEPENDENT_AMBULATORY_CARE_PROVIDER_SITE_OTHER): Payer: Medicare Other

## 2020-07-10 DIAGNOSIS — I483 Typical atrial flutter: Secondary | ICD-10-CM | POA: Diagnosis not present

## 2020-07-10 DIAGNOSIS — Z5181 Encounter for therapeutic drug level monitoring: Secondary | ICD-10-CM

## 2020-07-10 LAB — POCT INR: INR: 3.1 — AB (ref 2.0–3.0)

## 2020-07-10 NOTE — Patient Instructions (Signed)
-   have a large serving of greens today, then  - Continue taking Warfarin 1 tablet daily except for 1.5 tablets on Mondays and Fridays.  - Recheck INR in 6 weeks.   - Continue green leafy vegetables.  Call with new medications-Coumadin clinic [phone].

## 2020-07-13 DIAGNOSIS — Z961 Presence of intraocular lens: Secondary | ICD-10-CM | POA: Diagnosis not present

## 2020-07-13 DIAGNOSIS — H43813 Vitreous degeneration, bilateral: Secondary | ICD-10-CM | POA: Diagnosis not present

## 2020-07-13 DIAGNOSIS — H353232 Exudative age-related macular degeneration, bilateral, with inactive choroidal neovascularization: Secondary | ICD-10-CM | POA: Diagnosis not present

## 2020-07-13 DIAGNOSIS — H31013 Macula scars of posterior pole (postinflammatory) (post-traumatic), bilateral: Secondary | ICD-10-CM | POA: Diagnosis not present

## 2020-07-19 ENCOUNTER — Other Ambulatory Visit: Payer: Self-pay | Admitting: Internal Medicine

## 2020-07-19 ENCOUNTER — Ambulatory Visit
Admission: RE | Admit: 2020-07-19 | Discharge: 2020-07-19 | Disposition: A | Discharge status: Home / Self Care | Payer: Medicare Other | Source: Ambulatory Visit | Attending: Internal Medicine | Admitting: Internal Medicine

## 2020-07-19 DIAGNOSIS — M79601 Pain in right arm: Secondary | ICD-10-CM

## 2020-07-19 DIAGNOSIS — S40011A Contusion of right shoulder, initial encounter: Secondary | ICD-10-CM | POA: Diagnosis not present

## 2020-07-19 DIAGNOSIS — M25511 Pain in right shoulder: Secondary | ICD-10-CM

## 2020-07-19 DIAGNOSIS — M79621 Pain in right upper arm: Secondary | ICD-10-CM | POA: Diagnosis not present

## 2020-08-15 DIAGNOSIS — H3530 Unspecified macular degeneration: Secondary | ICD-10-CM | POA: Diagnosis not present

## 2020-08-17 DIAGNOSIS — I5032 Chronic diastolic (congestive) heart failure: Secondary | ICD-10-CM | POA: Diagnosis not present

## 2020-08-17 DIAGNOSIS — M810 Age-related osteoporosis without current pathological fracture: Secondary | ICD-10-CM | POA: Diagnosis not present

## 2020-08-17 DIAGNOSIS — I482 Chronic atrial fibrillation, unspecified: Secondary | ICD-10-CM | POA: Diagnosis not present

## 2020-08-17 DIAGNOSIS — E039 Hypothyroidism, unspecified: Secondary | ICD-10-CM | POA: Diagnosis not present

## 2020-08-17 DIAGNOSIS — I10 Essential (primary) hypertension: Secondary | ICD-10-CM | POA: Diagnosis not present

## 2020-08-17 DIAGNOSIS — N184 Chronic kidney disease, stage 4 (severe): Secondary | ICD-10-CM | POA: Diagnosis not present

## 2020-08-17 DIAGNOSIS — E7800 Pure hypercholesterolemia, unspecified: Secondary | ICD-10-CM | POA: Diagnosis not present

## 2020-08-17 DIAGNOSIS — G4700 Insomnia, unspecified: Secondary | ICD-10-CM | POA: Diagnosis not present

## 2020-08-21 ENCOUNTER — Ambulatory Visit (INDEPENDENT_AMBULATORY_CARE_PROVIDER_SITE_OTHER): Payer: Medicare Other | Admitting: Pharmacist

## 2020-08-21 ENCOUNTER — Other Ambulatory Visit: Payer: Self-pay

## 2020-08-21 DIAGNOSIS — I4892 Unspecified atrial flutter: Secondary | ICD-10-CM | POA: Diagnosis not present

## 2020-08-21 DIAGNOSIS — Z5181 Encounter for therapeutic drug level monitoring: Secondary | ICD-10-CM | POA: Diagnosis not present

## 2020-08-21 LAB — POCT INR: INR: 3.1 — AB (ref 2.0–3.0)

## 2020-08-21 NOTE — Patient Instructions (Signed)
Description   Eat some greens today and continue taking Warfarin 1 tablet daily except for 1.5 tablets on Mondays and Fridays.  - Recheck INR in 6 weeks.   - Continue green leafy vegetables.  Call with new medications-Coumadin clinic [phone].

## 2020-09-18 ENCOUNTER — Other Ambulatory Visit: Payer: Self-pay

## 2020-09-18 ENCOUNTER — Ambulatory Visit (INDEPENDENT_AMBULATORY_CARE_PROVIDER_SITE_OTHER): Payer: Medicare Other | Admitting: *Deleted

## 2020-09-18 DIAGNOSIS — Z5181 Encounter for therapeutic drug level monitoring: Secondary | ICD-10-CM

## 2020-09-18 DIAGNOSIS — I5032 Chronic diastolic (congestive) heart failure: Secondary | ICD-10-CM | POA: Diagnosis not present

## 2020-09-18 DIAGNOSIS — I4892 Unspecified atrial flutter: Secondary | ICD-10-CM | POA: Diagnosis not present

## 2020-09-18 DIAGNOSIS — I129 Hypertensive chronic kidney disease with stage 1 through stage 4 chronic kidney disease, or unspecified chronic kidney disease: Secondary | ICD-10-CM | POA: Diagnosis not present

## 2020-09-18 DIAGNOSIS — N184 Chronic kidney disease, stage 4 (severe): Secondary | ICD-10-CM | POA: Diagnosis not present

## 2020-09-18 DIAGNOSIS — F419 Anxiety disorder, unspecified: Secondary | ICD-10-CM | POA: Diagnosis not present

## 2020-09-18 LAB — POCT INR: INR: 2.5 (ref 2.0–3.0)

## 2020-09-18 NOTE — Patient Instructions (Signed)
Description   Continue taking Warfarin 1 tablet daily except for 1.5 tablets on Mondays and Fridays.  Recheck INR in 6 weeks.  Continue green leafy vegetables. Call with new medications-Coumadin clinic [phone].

## 2020-09-26 ENCOUNTER — Other Ambulatory Visit: Payer: Self-pay | Admitting: Cardiology

## 2020-09-26 DIAGNOSIS — I4821 Permanent atrial fibrillation: Secondary | ICD-10-CM

## 2020-09-26 DIAGNOSIS — I483 Typical atrial flutter: Secondary | ICD-10-CM

## 2020-09-26 NOTE — Telephone Encounter (Signed)
Prescription refill request received for warfarin Lov: Skains, 01/13/2020 Next INR check: 9/12 Warfarin tablet strength: 1mg 

## 2020-09-26 NOTE — Telephone Encounter (Signed)
Prescription refill request received for warfarin Lov: skains 01/13/20 Next INR check: 11/06/20 Warfarin tablet strength:1mg  Warfarin schedule: 1 tablet daily except for 1.5 tablets on Mondays and Fridays.  Will route to chst to make sure that they agree refilling is ok

## 2020-10-12 DIAGNOSIS — Z23 Encounter for immunization: Secondary | ICD-10-CM | POA: Diagnosis not present

## 2020-10-31 DIAGNOSIS — H53413 Scotoma involving central area, bilateral: Secondary | ICD-10-CM | POA: Diagnosis not present

## 2020-11-06 ENCOUNTER — Ambulatory Visit (INDEPENDENT_AMBULATORY_CARE_PROVIDER_SITE_OTHER): Payer: Medicare Other

## 2020-11-06 ENCOUNTER — Other Ambulatory Visit: Payer: Self-pay

## 2020-11-06 DIAGNOSIS — L821 Other seborrheic keratosis: Secondary | ICD-10-CM | POA: Diagnosis not present

## 2020-11-06 DIAGNOSIS — D1801 Hemangioma of skin and subcutaneous tissue: Secondary | ICD-10-CM | POA: Diagnosis not present

## 2020-11-06 DIAGNOSIS — Z5181 Encounter for therapeutic drug level monitoring: Secondary | ICD-10-CM | POA: Diagnosis not present

## 2020-11-06 DIAGNOSIS — I4892 Unspecified atrial flutter: Secondary | ICD-10-CM | POA: Diagnosis not present

## 2020-11-06 DIAGNOSIS — D225 Melanocytic nevi of trunk: Secondary | ICD-10-CM | POA: Diagnosis not present

## 2020-11-06 DIAGNOSIS — L820 Inflamed seborrheic keratosis: Secondary | ICD-10-CM | POA: Diagnosis not present

## 2020-11-06 DIAGNOSIS — D692 Other nonthrombocytopenic purpura: Secondary | ICD-10-CM | POA: Diagnosis not present

## 2020-11-06 DIAGNOSIS — L720 Epidermal cyst: Secondary | ICD-10-CM | POA: Diagnosis not present

## 2020-11-06 LAB — PROTIME-INR
INR: 6.6 (ref 0.9–1.2)
Prothrombin Time: 61.5 s — ABNORMAL HIGH (ref 9.1–12.0)

## 2020-11-06 LAB — POCT INR: INR: 7.2 — AB (ref 2.0–3.0)

## 2020-11-06 NOTE — Patient Instructions (Signed)
Description   Lab INR 6.6 called pt and daughter and instructed to Hold Warfarin today 9/12, tomorrow 9/13, and Wednesday 9/14 then resume Warfarin 1 tablet daily except for 1.5 tablets on Mondays and Fridays. Recheck INR in 5 days. Continue green leafy vegetables.  - Call with new medications-Coumadin clinic [phone].

## 2020-11-07 DIAGNOSIS — H53413 Scotoma involving central area, bilateral: Secondary | ICD-10-CM | POA: Diagnosis not present

## 2020-11-10 ENCOUNTER — Ambulatory Visit (INDEPENDENT_AMBULATORY_CARE_PROVIDER_SITE_OTHER): Payer: Medicare Other | Admitting: *Deleted

## 2020-11-10 ENCOUNTER — Other Ambulatory Visit: Payer: Self-pay

## 2020-11-10 DIAGNOSIS — Z5181 Encounter for therapeutic drug level monitoring: Secondary | ICD-10-CM

## 2020-11-10 LAB — POCT INR: INR: 2.9 (ref 2.0–3.0)

## 2020-11-10 NOTE — Patient Instructions (Signed)
Description   Start taking warfarin 1 tablet daily except for 1.5 tablets on Mondays. Recheck INR in 1 week. Coumadin Clinic [phone].

## 2020-11-15 DIAGNOSIS — H53413 Scotoma involving central area, bilateral: Secondary | ICD-10-CM | POA: Diagnosis not present

## 2020-11-17 ENCOUNTER — Ambulatory Visit (INDEPENDENT_AMBULATORY_CARE_PROVIDER_SITE_OTHER): Payer: Medicare Other

## 2020-11-17 ENCOUNTER — Other Ambulatory Visit: Payer: Self-pay

## 2020-11-17 DIAGNOSIS — Z23 Encounter for immunization: Secondary | ICD-10-CM | POA: Diagnosis not present

## 2020-11-17 DIAGNOSIS — Z5181 Encounter for therapeutic drug level monitoring: Secondary | ICD-10-CM | POA: Diagnosis not present

## 2020-11-17 DIAGNOSIS — I4892 Unspecified atrial flutter: Secondary | ICD-10-CM

## 2020-11-17 LAB — POCT INR: INR: 2.6 (ref 2.0–3.0)

## 2020-11-17 NOTE — Patient Instructions (Signed)
Description   Continue on same dosage of  Warfarin 1 tablet daily except for 1.5 tablets on Mondays. Recheck INR in 2 weeks. Coumadin Clinic [phone].

## 2020-11-30 ENCOUNTER — Other Ambulatory Visit: Payer: Self-pay

## 2020-11-30 ENCOUNTER — Ambulatory Visit (INDEPENDENT_AMBULATORY_CARE_PROVIDER_SITE_OTHER): Payer: Medicare Other | Admitting: *Deleted

## 2020-11-30 DIAGNOSIS — Z5181 Encounter for therapeutic drug level monitoring: Secondary | ICD-10-CM

## 2020-11-30 DIAGNOSIS — I4892 Unspecified atrial flutter: Secondary | ICD-10-CM

## 2020-11-30 LAB — POCT INR: INR: 3 (ref 2.0–3.0)

## 2020-11-30 NOTE — Patient Instructions (Signed)
Description   Continue taking Warfarin 1 tablet daily except for 1.5 tablets on Mondays. Eat your leafy veggies & remain consistent. Recheck INR in 3 weeks. Coumadin Clinic [phone].

## 2020-12-13 DIAGNOSIS — M818 Other osteoporosis without current pathological fracture: Secondary | ICD-10-CM | POA: Diagnosis not present

## 2020-12-20 DIAGNOSIS — H53413 Scotoma involving central area, bilateral: Secondary | ICD-10-CM | POA: Diagnosis not present

## 2020-12-21 ENCOUNTER — Ambulatory Visit (INDEPENDENT_AMBULATORY_CARE_PROVIDER_SITE_OTHER): Payer: Medicare Other

## 2020-12-21 ENCOUNTER — Other Ambulatory Visit: Payer: Self-pay

## 2020-12-21 DIAGNOSIS — I4892 Unspecified atrial flutter: Secondary | ICD-10-CM | POA: Diagnosis not present

## 2020-12-21 DIAGNOSIS — Z5181 Encounter for therapeutic drug level monitoring: Secondary | ICD-10-CM | POA: Diagnosis not present

## 2020-12-21 LAB — POCT INR: INR: 3 (ref 2.0–3.0)

## 2020-12-21 NOTE — Patient Instructions (Signed)
Description   Continue taking Warfarin 1 tablet daily except for 1.5 tablets on Mondays. Eat your leafy veggies & remain consistent. Recheck INR in 4 weeks, when you see Dr Marlou Porch. Coumadin Clinic [phone].

## 2020-12-23 ENCOUNTER — Other Ambulatory Visit: Payer: Self-pay | Admitting: Cardiology

## 2020-12-23 DIAGNOSIS — I4821 Permanent atrial fibrillation: Secondary | ICD-10-CM

## 2020-12-23 DIAGNOSIS — I483 Typical atrial flutter: Secondary | ICD-10-CM

## 2020-12-29 DIAGNOSIS — N184 Chronic kidney disease, stage 4 (severe): Secondary | ICD-10-CM | POA: Diagnosis not present

## 2020-12-29 DIAGNOSIS — I129 Hypertensive chronic kidney disease with stage 1 through stage 4 chronic kidney disease, or unspecified chronic kidney disease: Secondary | ICD-10-CM | POA: Diagnosis not present

## 2020-12-29 DIAGNOSIS — E7800 Pure hypercholesterolemia, unspecified: Secondary | ICD-10-CM | POA: Diagnosis not present

## 2020-12-29 DIAGNOSIS — I482 Chronic atrial fibrillation, unspecified: Secondary | ICD-10-CM | POA: Diagnosis not present

## 2020-12-29 DIAGNOSIS — E039 Hypothyroidism, unspecified: Secondary | ICD-10-CM | POA: Diagnosis not present

## 2020-12-29 DIAGNOSIS — M818 Other osteoporosis without current pathological fracture: Secondary | ICD-10-CM | POA: Diagnosis not present

## 2020-12-29 DIAGNOSIS — I10 Essential (primary) hypertension: Secondary | ICD-10-CM | POA: Diagnosis not present

## 2020-12-29 DIAGNOSIS — G4700 Insomnia, unspecified: Secondary | ICD-10-CM | POA: Diagnosis not present

## 2021-01-04 DIAGNOSIS — H53413 Scotoma involving central area, bilateral: Secondary | ICD-10-CM | POA: Diagnosis not present

## 2021-01-08 ENCOUNTER — Other Ambulatory Visit: Payer: Self-pay

## 2021-01-08 ENCOUNTER — Ambulatory Visit (INDEPENDENT_AMBULATORY_CARE_PROVIDER_SITE_OTHER): Payer: Medicare Other | Admitting: Cardiology

## 2021-01-08 ENCOUNTER — Ambulatory Visit (INDEPENDENT_AMBULATORY_CARE_PROVIDER_SITE_OTHER): Payer: Medicare Other

## 2021-01-08 VITALS — BP 112/70 | HR 78 | Ht 63.0 in | Wt 155.0 lb

## 2021-01-08 DIAGNOSIS — Z5181 Encounter for therapeutic drug level monitoring: Secondary | ICD-10-CM

## 2021-01-08 DIAGNOSIS — I4821 Permanent atrial fibrillation: Secondary | ICD-10-CM

## 2021-01-08 DIAGNOSIS — I350 Nonrheumatic aortic (valve) stenosis: Secondary | ICD-10-CM

## 2021-01-08 DIAGNOSIS — I483 Typical atrial flutter: Secondary | ICD-10-CM | POA: Diagnosis not present

## 2021-01-08 DIAGNOSIS — Z7901 Long term (current) use of anticoagulants: Secondary | ICD-10-CM

## 2021-01-08 DIAGNOSIS — I4811 Longstanding persistent atrial fibrillation: Secondary | ICD-10-CM | POA: Insufficient documentation

## 2021-01-08 DIAGNOSIS — N184 Chronic kidney disease, stage 4 (severe): Secondary | ICD-10-CM | POA: Diagnosis not present

## 2021-01-08 LAB — POCT INR: INR: 2.5 (ref 2.0–3.0)

## 2021-01-08 NOTE — Assessment & Plan Note (Signed)
On chronic Coumadin.  Dose adjusting.  When she had her teeth pulled, they had trouble controlling INR well.  Last INR 3.  Hemoglobin 14.7 creatinine 1.47

## 2021-01-08 NOTE — Assessment & Plan Note (Signed)
Went to Coumadin clinic earlier today.  Dose stabilization.  Adjustments made per primary team with our supervision.

## 2021-01-08 NOTE — Progress Notes (Signed)
Cardiology Office Note:    Date:  01/08/2021   ID:  [name], DOB 09-Jun-1931, MRN [id number]  PCP:  Lavone Orn, MD  [facility] Cardiologist:  Candee Furbish, MD  [facility] Electrophysiologist:  None   Referring MD: Lavone Orn, MD     History of Present Illness:    [name] is a 87 y.o. female here for the follow-up of atrial fibrillation/atrial flutter.  Has had a few eye procedures recently with legal blindness and macular degeneration.  Stated that she did have some bleeding noted in her retina but in an already damaged part with macular degeneration.  Husband was a Psychologist, clinical. Used to keep chicken, geese, [place], and [place] in her backyard.    At her last visit, she reported incline increased her HR while taking the garbage out.  She was having someone help her with this.   Today, she is doing good and is using her walker.   She endorses bilateral LE edema. Her L leg is currently swollen. Her L leg typically swells more than her R leg.   She took Prolia but cannot take calcium supplements because of kidney issues that began when she was undergoing chemotherapy. Her kidneys have reportedly been improving.   To keep her calcium up, she is eating cheese and oranges. However, these foods can cause her bilateral LE swelling to worsen to the point she is unable to feel the bone.   She enjoys butterscotch candies as a treat. However, before bed, she experiences chest pain that she believes was pre-heart attack. She has stopped eating the butterscotch candies since. He has also stopped consuming caffeine.   She denies any palpitations or shortness of breath. No lightheadedness, headaches, syncope, orthopnea, PND, or exertional symptoms.  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[place])    Chemotherapy, Magrinat and Clarke-Pearson   Macular degeneration    Migraine headache    Improved after menopause   Mitral regurgitation 11/27/2012   Renal disorder    Shortness of breath    Shortness of breath    Cardiology Eval., possible diastolic dysfunctio   UTI (urinary tract infection)    Frequent   Visual loss, right eye    Left ocular accident   Vitamin D deficiency    Wrist fracture, right 12/2009    Past Surgical History:  Procedure Laterality Date   BLEPHAROPLASTY     Cataract Surgery Bilateral    NERVE BIOPSY     TONSILLECTOMY     TOTAL ABDOMINAL HYSTERECTOMY W/ BILATERAL SALPINGOOPHORECTOMY  01/2012   , Partial resection of the sigmoid colon    Current Medications: Current Meds  Medication Sig   Cholecalciferol (VITAMIN D) 2000 UNITS tablet Take 2,000 Units by mouth daily.   ciprofloxacin (CIPRO) 250 MG tablet Take 250 mg by mouth 2 (two) times daily as needed (for cystitis symptoms). For symptoms    diltiazem (CARDIZEM CD) 180 MG 24 hr capsule Take 1 capsule (180 mg total) by mouth in the morning. On a  empty stomach.   fluocinonide (LIDEX) 0.05 % external solution    levothyroxine (SYNTHROID, LEVOTHROID) 75 MCG tablet Take 75 mcg by mouth every other day.    lisinopril (PRINIVIL,ZESTRIL) 10 MG tablet Take 10 mg by mouth daily.   loratadine (CLARITIN) 10 MG tablet Take 10 mg by mouth daily as needed for allergies.    metoprolol succinate (TOPROL-XL) 100 MG 24 hr tablet Take 1 tablet (100 mg total) by mouth daily. Take with or immediately following a meal.   Misc Natural Products (LUTEIN 20) CAPS Take 1 capsule by mouth daily.   potassium chloride SA (K-DUR,KLOR-CON) 20 MEQ tablet Take 20 mEq by mouth daily.    Probiotic Product (ALIGN PO) Take 1 capsule by mouth  daily.   SYNTHROID 50 MCG tablet Take 50 mcg by mouth every other day.    torsemide (DEMADEX) 20 MG tablet Take 20 mg by mouth every evening.    triamcinolone cream (KENALOG) 0.1 %    warfarin (COUMADIN) 1 MG tablet TAKE 1- 1.5 TABLETS DAILY AS DIRECTED by coumadin clinic     Allergies:   Plendil [felodipine], Aspirin, Barbiturates, Eggs or egg-derived products, Epinephrine, Erythromycin base, Felodipine er, Iodinated diagnostic agents, Iodine, Morphine and related, Oxycodone hcl, Penicillins, Pravachol, Soy allergy, Sulfa antibiotics, Wheat, and Adhesive [tape]   Social History   Socioeconomic History   Marital status: Single    Spouse name: Not on file   Number of children: Not on file   Years of education: Not on file   Highest education level: Not on file  Occupational History   Not on file  Tobacco Use   Smoking status: Never   Smokeless tobacco: Never  Vaping Use   Vaping Use: Never used  Substance and Sexual Activity   Alcohol use: No   Drug use: No   Sexual activity: Never    Birth control/protection: Post-menopausal  Other Topics Concern   Not on file  Social History Narrative   Not on file   Social Determinants of Health   Financial Resource Strain: Not on file  Food Insecurity: Not on file  Transportation Needs: Not on file  Physical Activity: Not on file  Stress: Not on file  Social Connections: Not on file     Family History: The patient's family history includes Cancer in her father; Heart disease in her maternal grandfather, maternal grandmother, and mother; Hypertension in her mother and sister; Peripheral Artery Disease in her sister. There is no history of Breast cancer.  ROS:   Please see the history of present illness.    (+) Chest pain (+) Bilateral LE edema (L>R) All other systems reviewed and are negative.  EKGs/Labs/Other Studies Reviewed:    The following studies were reviewed today: ECHO 01/28/2019:  1. Left ventricular ejection  fraction, by visual estimation, is 60 to  65%. The left ventricle has normal function. There is no left ventricular  hypertrophy.   2. Left ventricular diastolic function could not be evaluated.   3. Global right ventricle has normal systolic function.The right  ventricular size is normal. No increase in right ventricular wall  thickness.   4. Left atrial size was severely dilated.   5. Right atrial size was severely dilated.   6. Mild mitral annular calcification.   7. The mitral valve is normal in structure. Mild mitral valve  regurgitation. No evidence of mitral stenosis.   8. The tricuspid valve is normal in structure. Tricuspid valve  regurgitation is mild.   9. The  aortic valve is normal in structure. Aortic valve regurgitation is  not visualized. Moderate aortic valve stenosis.  10. There is Moderate calcification of the aortic valve.  11. There is Moderate thickening of the aortic valve.  12. The pulmonic valve was normal in structure. Pulmonic valve  regurgitation is not visualized.  13. There is mild dilatation of the ascending aorta.  14. Normal pulmonary artery systolic pressure.  15. The inferior vena cava is normal in size with greater than 50%  respiratory variability, suggesting right atrial pressure of 3 mmHg.   Echocardiogram 02/04/2018: - Left ventricle: The cavity size was normal. There was mild   concentric hypertrophy. Systolic function was normal. The   estimated ejection fraction was in the range of 60% to 65%. Wall   motion was normal; there were no regional wall motion   abnormalities. - Aortic valve: Trileaflet; severely thickened, severely calcified   leaflets. Right coronary cusp mobility was moderately restricted.   There was mild to moderate stenosis. Peak velocity (S): 316 cm/s.   Mean gradient (S): 16 mm Hg. - Mitral valve: There was mild regurgitation. - Left atrium: The atrium was severely dilated. - Right atrium: The atrium was severely  dilated. - Pulmonary arteries: PA peak pressure: 33 mm Hg (S).  Impressions: - Normal LV EF. Thickened, calcified aortic valve with moderate   restriction of RCC. Gradients averaged due to atrial   fibrillation, but support mild-moderate AS. Mild MR. Severe   biatrial enlargement. Echocardiogram 2014 normal EF with moderate mitral regurgitation  EKG:  EKG was personally reviewed 01/08/21: Afib, rate 78 bpm 01/13/20:atrial fibrillation 78 bpm left axis deviation  Recent Labs: No results found for requested labs within last 8760 hours.  Recent Lipid Panel No results found for: CHOL, TRIG, HDL, CHOLHDL, VLDL, LDLCALC, LDLDIRECT    Physical Exam:    VS:  BP 112/70 (BP Location: Left Arm, Patient Position: Sitting, Cuff Size: Normal)   Pulse 78   Ht 5\' 3"  (1.6 m)   Wt 155 lb (70.3 kg)   SpO2 97%   BMI 27.46 kg/m     Wt Readings from Last 3 Encounters:  01/08/21 155 lb (70.3 kg)  01/13/20 153 lb (69.4 kg)  12/17/18 170 lb (77.1 kg)     GEN:  Well nourished, well developed in no acute distress HEENT: Normal NECK: No JVD; No carotid bruits LYMPHATICS: No lymphadenopathy CARDIAC: irreg, 3/6 SM, no rubs, gallops RESPIRATORY:  Clear to auscultation without rales, wheezing or rhonchi  ABDOMEN: Soft, non-tender, non-distended MUSCULOSKELETAL:  LLE edema; No deformity  SKIN: Warm and dry NEUROLOGIC:  Alert and oriented x 3 PSYCHIATRIC:  Normal affect   ASSESSMENT:    1. Permanent atrial fibrillation ([place])   2. Longstanding persistent atrial fibrillation ([place])   3. Typical atrial flutter ([place])   4. Chronic anticoagulation   5. CKD (chronic kidney disease), stage IV ([place])   6. Nonrheumatic aortic valve stenosis   7. Encounter for therapeutic drug monitoring     PLAN:    Longstanding persistent atrial fibrillation ([place]) Continue with good rate control on diltiazem and metoprolol.  No changes.  Asymptomatic.  Doing well.  Chronic anticoagulation On chronic Coumadin.   Dose adjusting.  When she had her teeth pulled, they had trouble controlling INR well.  Last INR 3.  Hemoglobin 14.7 creatinine 1.47  CKD (chronic kidney disease), stage IV Continuing to monitor creatinine.  Last check 1.47.  She is on low-dose lisinopril for renal protection.  Also  takes torsemide 20 mg to help her with fluid.  Lower extremity edema chronic.  Aortic stenosis We are going to check an echocardiogram.  Previously aortic stenosis was moderate.  Not having any significant shortness of breath anginal symptoms or syncope.  Encounter for therapeutic drug monitoring Went to Coumadin clinic earlier today.  Dose stabilization.  Adjustments made per primary team with our supervision.     Shared Decision Making/Informed Consent        Medication Adjustments/Labs and Tests Ordered: Current medicines are reviewed at length with the patient today.  Concerns regarding medicines are outlined above.  Orders Placed This Encounter  Procedures   EKG 12-Lead   ECHOCARDIOGRAM COMPLETE    No orders of the defined types were placed in this encounter.   Patient Instructions  Medication Instructions:  The current medical regimen is effective;  continue present plan and medications.  *If you need a refill on your cardiac medications before your next appointment, please call your pharmacy*  Testing/Procedures: Your physician has requested that you have an echocardiogram. Echocardiography is a painless test that uses sound waves to create images of your heart. It provides your doctor with information about the size and shape of your heart and how well your heart's chambers and valves are working. This procedure takes approximately one hour. There are no restrictions for this procedure.  Follow-Up: At [facility], you and your health needs are our priority.  As part of our continuing mission to provide you with exceptional heart care, we have created designated Provider Care Teams.  These  Care Teams include your primary Cardiologist (physician) and Advanced Practice Providers (APPs -  Physician Assistants and Nurse Practitioners) who all work together to provide you with the care you need, when you need it.  We recommend signing up for the patient portal called "MyChart".  Sign up information is provided on this After Visit Summary.  MyChart is used to connect with patients for Virtual Visits (Telemedicine).  Patients are able to view lab/test results, encounter notes, upcoming appointments, etc.  Non-urgent messages can be sent to your provider as well.   To learn more about what you can do with MyChart, go to [email or web address].    Your next appointment:   1 year(s)  The format for your next appointment:   In Person  Provider:   Candee Furbish, MD     Thank you for choosing [facility]!!     Wilhemina Bonito as a scribe for Candee Furbish, MD.,have documented all relevant documentation on the behalf of Candee Furbish, MD,as directed by  Candee Furbish, MD while in the presence of Candee Furbish, MD.  I, Candee Furbish, MD, have reviewed all documentation for this visit. The documentation on 01/08/21 for the exam, diagnosis, procedures, and orders are all accurate and complete.   Signed, Candee Furbish, MD  01/08/2021 5:09 PM    [place]

## 2021-01-08 NOTE — Patient Instructions (Signed)
Description   Continue taking Warfarin 1 tablet daily except for 1.5 tablets on Mondays. Eat your leafy veggies & remain consistent. Recheck INR in 5 weeks.Coumadin Clinic [phone].

## 2021-01-08 NOTE — Patient Instructions (Signed)
Medication Instructions:  The current medical regimen is effective;  continue present plan and medications.  *If you need a refill on your cardiac medications before your next appointment, please call your pharmacy*  Testing/Procedures: Your physician has requested that you have an echocardiogram. Echocardiography is a painless test that uses sound waves to create images of your heart. It provides your doctor with information about the size and shape of your heart and how well your heart's chambers and valves are working. This procedure takes approximately one hour. There are no restrictions for this procedure.  Follow-Up: At [facility], you and your health needs are our priority.  As part of our continuing mission to provide you with exceptional heart care, we have created designated Provider Care Teams.  These Care Teams include your primary Cardiologist (physician) and Advanced Practice Providers (APPs -  Physician Assistants and Nurse Practitioners) who all work together to provide you with the care you need, when you need it.  We recommend signing up for the patient portal called "MyChart".  Sign up information is provided on this After Visit Summary.  MyChart is used to connect with patients for Virtual Visits (Telemedicine).  Patients are able to view lab/test results, encounter notes, upcoming appointments, etc.  Non-urgent messages can be sent to your provider as well.   To learn more about what you can do with MyChart, go to [email or web address].    Your next appointment:   1 year(s)  The format for your next appointment:   In Person  Provider:   Mark Skains, MD     Thank you for choosing [facility]!!    

## 2021-01-08 NOTE — Assessment & Plan Note (Signed)
We are going to check an echocardiogram.  Previously aortic stenosis was moderate.  Not having any significant shortness of breath anginal symptoms or syncope.

## 2021-01-08 NOTE — Assessment & Plan Note (Signed)
Continuing to monitor creatinine.  Last check 1.47.  She is on low-dose lisinopril for renal protection.  Also takes torsemide 20 mg to help her with fluid.  Lower extremity edema chronic.

## 2021-01-08 NOTE — Assessment & Plan Note (Addendum)
Continue with good rate control on diltiazem and metoprolol.  No changes.  Asymptomatic.  Doing well.

## 2021-01-19 ENCOUNTER — Other Ambulatory Visit: Payer: Self-pay | Admitting: Cardiology

## 2021-01-19 ENCOUNTER — Other Ambulatory Visit: Payer: Self-pay | Admitting: Interventional Cardiology

## 2021-01-31 DIAGNOSIS — H53413 Scotoma involving central area, bilateral: Secondary | ICD-10-CM | POA: Diagnosis not present

## 2021-02-12 ENCOUNTER — Ambulatory Visit (HOSPITAL_COMMUNITY): Payer: Medicare Other | Attending: Cardiology

## 2021-02-12 ENCOUNTER — Ambulatory Visit (INDEPENDENT_AMBULATORY_CARE_PROVIDER_SITE_OTHER): Payer: Medicare Other

## 2021-02-12 ENCOUNTER — Other Ambulatory Visit: Payer: Self-pay

## 2021-02-12 DIAGNOSIS — I4821 Permanent atrial fibrillation: Secondary | ICD-10-CM | POA: Diagnosis not present

## 2021-02-12 DIAGNOSIS — I4892 Unspecified atrial flutter: Secondary | ICD-10-CM

## 2021-02-12 DIAGNOSIS — Z5181 Encounter for therapeutic drug level monitoring: Secondary | ICD-10-CM | POA: Diagnosis not present

## 2021-02-12 DIAGNOSIS — I4811 Longstanding persistent atrial fibrillation: Secondary | ICD-10-CM | POA: Diagnosis not present

## 2021-02-12 DIAGNOSIS — I483 Typical atrial flutter: Secondary | ICD-10-CM | POA: Diagnosis not present

## 2021-02-12 LAB — POCT INR: INR: 2.2 (ref 2.0–3.0)

## 2021-02-12 NOTE — Patient Instructions (Signed)
Continue taking Warfarin 1 tablet daily except for 1.5 tablets on Mondays. Eat your leafy veggies & remain consistent. Recheck INR in 6 weeks.Coumadin Clinic [phone].

## 2021-02-14 ENCOUNTER — Telehealth: Payer: Self-pay | Admitting: *Deleted

## 2021-02-14 DIAGNOSIS — I350 Nonrheumatic aortic (valve) stenosis: Secondary | ICD-10-CM

## 2021-02-14 DIAGNOSIS — H53413 Scotoma involving central area, bilateral: Secondary | ICD-10-CM | POA: Diagnosis not present

## 2021-02-14 NOTE — Telephone Encounter (Signed)
Aortic stenosis is now in severe range. Let's set up a consult with structural heart team to discuss potential TAVR   Pt is aware of the above results and that she will be contacted to be scheduled.

## 2021-03-02 ENCOUNTER — Ambulatory Visit (INDEPENDENT_AMBULATORY_CARE_PROVIDER_SITE_OTHER): Payer: Medicare Other | Admitting: Internal Medicine

## 2021-03-02 ENCOUNTER — Other Ambulatory Visit: Payer: Self-pay | Admitting: Internal Medicine

## 2021-03-02 ENCOUNTER — Other Ambulatory Visit: Payer: Self-pay

## 2021-03-02 ENCOUNTER — Encounter: Payer: Self-pay | Admitting: Internal Medicine

## 2021-03-02 VITALS — BP 128/86 | HR 84 | Ht 63.0 in | Wt 151.0 lb

## 2021-03-02 DIAGNOSIS — I350 Nonrheumatic aortic (valve) stenosis: Secondary | ICD-10-CM

## 2021-03-02 DIAGNOSIS — Z01818 Encounter for other preprocedural examination: Secondary | ICD-10-CM | POA: Diagnosis not present

## 2021-03-02 DIAGNOSIS — Z01812 Encounter for preprocedural laboratory examination: Secondary | ICD-10-CM

## 2021-03-02 LAB — ECHOCARDIOGRAM COMPLETE
AR max vel: 0.8 cm2
AV Area VTI: 0.67 cm2
AV Area mean vel: 0.71 cm2
AV Mean grad: 28 mmHg
AV Peak grad: 48.6 mmHg
Ao pk vel: 3.49 m/s
Area-P 1/2: 4.8 cm2
S' Lateral: 3.5 cm

## 2021-03-02 MED ORDER — SODIUM CHLORIDE 0.9% FLUSH: 3.0000 mL | Freq: Two times a day (BID) | INTRAVENOUS | Status: AC

## 2021-03-02 NOTE — Progress Notes (Signed)
Patient ID: [name] MRN: [id number] DOB/AGE: 09/20/1931 88 y.o.  Primary Care Physician:Griffin, Jenny Reichmann, MD Primary Cardiologist: Candee Furbish, MD   FOCUSED CARDIOVASCULAR PROBLEM LIST:   1.  Moderate to severe aortic stenosis with an aortic valve area of 0.64 cm, dimensionless index 0.23 and mean gradient 32 mmHg with a V-max of 3.9 m/s; ejection fraction 55 to 60%; EKG demonstrates atrial fibrillation with incomplete right bundle branch block 2.  Mild to moderate mitral regurgitation 3.  Chronic kidney disease with a creatinine of proximately 1.4 4.  Atrial fibrillation and flutter on Coumadin 5.  Hypothyroidism 6.  Leiomyosarcoma treated with chemotherapy 2003 7.  Hypertension 8.  Legally blind    HISTORY OF PRESENT ILLNESS: The patient is a 88 y.o. female with the indicated medical history here for recommendations regarding her aortic valvular disease.  She is here today with her daughter.  They tell me that the patient has developed more exertional dyspnea.  She lives by herself and had been able to go to her mailbox without any issues.  Over the past few months she has noticed increasing dyspnea on exertion when doing this.  She also has been more short of breath around the house.  She does not going grocery shopping since the pandemic.  However she does do her activities of daily living in regards to what she can.  She is legally blind but has macular degeneration and can make out some rough shapes and colors.  This does not seem to limit her at home.  She denies any presyncope, syncope, severe palpitations, paroxysmal nocturnal dyspnea, orthopnea.  She does have a history of leiomyosarcoma.  She experienced acute kidney injury and the development of chronic kidney disease following treatment of this back in 2003 or so.  Since that time her creatinine has improved.  She does see a dentist on a regular basis.  She had to have all of her teeth pulled and has dentures except  for 2 teeth and she tells me that these may need to come out.     Past Medical History:  Diagnosis Date   Atrial fibrillation ([place])    Blood transfusion    " no reaction to transfusion"   Cancer ([place])    Complication of anesthesia    sensitive to drugs   Dysrhythmia    atrial flutter   Headache(784.0)    Hyperlipidemia    Hypertension    Hypothyroidism    Impaired fasting glucose    Kidney disease, chronic, stage IV (GFR 15-29 ml/min) (HCC)    Leiomyosarcoma of uterus (HCC)    Chemotherapy, Magrinat and [place]-Pearson   Macular degeneration    Migraine headache    Improved after menopause   Mitral regurgitation 11/27/2012   Renal disorder    Shortness of breath    Shortness of breath    Cardiology Eval., possible diastolic dysfunctio   UTI (urinary tract infection)    Frequent   Visual loss, right eye    Left ocular accident   Vitamin D deficiency    Wrist fracture, right 12/2009    Past Surgical History:  Procedure Laterality Date   BLEPHAROPLASTY     Cataract Surgery Bilateral    NERVE BIOPSY     TONSILLECTOMY     TOTAL ABDOMINAL HYSTERECTOMY W/ BILATERAL SALPINGOOPHORECTOMY  01/2012   , Partial resection of the sigmoid colon    Family History  Problem Relation Age of Onset   Heart disease Mother  Hypertension Mother    Cancer Father    Peripheral Artery Disease Sister    Hypertension Sister    Heart disease Maternal Grandmother    Heart disease Maternal Grandfather    Breast cancer Neg Hx     Social History   Socioeconomic History   Marital status: Single    Spouse name: Not on file   Number of children: Not on file   Years of education: Not on file   Highest education level: Not on file  Occupational History   Not on file  Tobacco Use   Smoking status: Never   Smokeless tobacco: Never  Vaping Use   Vaping Use: Never used  Substance and Sexual Activity   Alcohol use: No   Drug use: No   Sexual activity: Never    Birth control/protection:  Post-menopausal  Other Topics Concern   Not on file  Social History Narrative   Not on file   Social Determinants of Health   Financial Resource Strain: Not on file  Food Insecurity: Not on file  Transportation Needs: Not on file  Physical Activity: Not on file  Stress: Not on file  Social Connections: Not on file  Intimate Partner Violence: Not on file     Prior to Admission medications   Medication Sig Start Date End Date Taking? Authorizing Provider  Cholecalciferol (VITAMIN D) 2000 UNITS tablet Take 2,000 Units by mouth daily.    [provider]  ciprofloxacin (CIPRO) 250 MG tablet Take 250 mg by mouth 2 (two) times daily as needed (for cystitis symptoms). For symptoms     [provider]  diltiazem (CARDIZEM CD) 180 MG 24 hr capsule TAKE 1 CAPSULE IN THE MORNING ON AN EMPTY STOMACH. 01/23/21   Jerline Pain, MD  fluocinonide (LIDEX) 0.05 % external solution  10/16/18   [provider]  levothyroxine (SYNTHROID, LEVOTHROID) 75 MCG tablet Take 75 mcg by mouth every other day.     [provider]  lisinopril (PRINIVIL,ZESTRIL) 10 MG tablet Take 10 mg by mouth daily.    [provider]  loratadine (CLARITIN) 10 MG tablet Take 10 mg by mouth daily as needed for allergies.     [provider]  metoprolol succinate (TOPROL-XL) 100 MG 24 hr tablet TAKE ONE TABLET EVERY DAY WITH OR IMMEDIATELY FOLLOWING A MEAL 01/23/21   Jerline Pain, MD  Misc Natural Products (LUTEIN 20) CAPS Take 1 capsule by mouth daily.    [provider]  potassium chloride SA (K-DUR,KLOR-CON) 20 MEQ tablet Take 20 mEq by mouth daily.     [provider]  Probiotic Product (ALIGN PO) Take 1 capsule by mouth daily.    [provider]  SYNTHROID 50 MCG tablet Take 50 mcg by mouth every other day.  02/24/14   [provider]  torsemide (DEMADEX) 20 MG tablet Take 20 mg by mouth every evening.  08/11/13   [provider]   triamcinolone cream (KENALOG) 0.1 %  10/07/18   [provider]  warfarin (COUMADIN) 1 MG tablet TAKE 1- 1.5 TABLETS DAILY AS DIRECTED by coumadin clinic 12/25/20   Jerline Pain, MD    Allergies  Allergen Reactions   Plendil [Felodipine]    Aspirin     Stomach upset   Barbiturates     Poorly tolerated   Eggs Or Egg-Derived Products     diarrhea   Epinephrine     Fever    Erythromycin Base Nausea And Vomiting  All mycins   Felodipine Er     headache   Iodine Other (See Comments)    Kidneys shut down   Morphine And Related     Rash    Oxycodone Hcl     Heart race   Penicillins     Fever    Pravachol     Muscle ache   Soy Allergy     shaking   Sulfa Antibiotics     rash   Wheat     Stomach upset   Adhesive [Tape] Rash    Rash     REVIEW OF SYSTEMS:  General: no fevers/chills/night sweats Eyes: no blurry vision, diplopia, or amaurosis ENT: no sore throat or hearing loss Resp: no cough, wheezing, or hemoptysis CV: no edema or palpitations GI: no abdominal pain, nausea, vomiting, diarrhea, or constipation GU: no dysuria, frequency, or hematuria Skin: no rash Neuro: no headache, numbness, tingling, or weakness of extremities Musculoskeletal: no joint pain or swelling Heme: no bleeding, DVT, or easy bruising Endo: no polydipsia or polyuria  BP 128/86    Pulse 84    Ht 5\' 3"  (1.6 m)    Wt 151 lb (68.5 kg)    SpO2 98%    BMI 26.75 kg/m   PHYSICAL EXAM: GEN:  AO x 3 in no acute distress, frail HEENT: normal Dentition: Abnormal Neck: JVP normal. +1carotid upstrokes without bruits. No thyromegaly. Lungs: equal expansion, clear bilaterally CV: Apex is discrete and nondisplaced, RRR SEM 4/6 Abd: soft, non-tender, non-distended; no bruit; positive bowel sounds Ext: no edema, ecchymoses, or cyanosis Vascular: 2+ femoral pulses, 2+ radial pulses       Skin: warm and dry without rash Neuro: CN II-XII grossly intact; motor and sensory grossly  intact    DATA AND STUDIES:  EKG: Atrial fibrillation with incomplete right bundle branch block  2D ECHO:    12/22  1. There is severe paradoxical aortic stenosis with AVA 0.64cm2 by  continuity, mean gradient 17mmHg, peak gradient 27mmHg, Vmax 3.9 m/s, DI  0.23, SVI 19.5.   2. Left ventricular ejection fraction, by estimation, is 55 to 60%. The  left ventricle has normal function. The left ventricle has no regional  wall motion abnormalities. There is mild concentric left ventricular  hypertrophy. Diastolic function is  indeterminant due to atrial fibrillation.   3. Right ventricular systolic function is normal. The right ventricular  size is normal. There is moderately elevated pulmonary artery systolic  pressure. The estimated right ventricular systolic pressure is 84.6 mmHg.   4. Left atrial size was severely dilated.   5. Right atrial size was severely dilated.   6. The mitral valve is degenerative. Mild mitral valve regurgitation.   7. The aortic valve is tricuspid. There is moderate calcification of the  aortic valve. There is severe thickening of the aortic valve. Aortic valve  regurgitation is trivial. Severe aortic valve stenosis.   8. The inferior vena cava is normal in size with <50% respiratory  variability, suggesting right atrial pressure of 8 mmHg.   CARDIAC CATH: pending  STS RISK CALCULATOR: Pending  NHYA CLASS: 2-3    ASSESSMENT AND PLAN:   Nonrheumatic aortic valve stenosis  The patient has developed exertional dyspnea due to her stage D aortic stenosis.  The patient is relatively active for her age and despite her legal blindness would like to remain so.  I think it makes sense to consider an aortic valve intervention.  The patient does have a history of  chronic kidney disease and so we will check a BMP.  Her chart does indicate that she has an IV dye allergy but it seems that this was due to the fact when she had a lot of CT scans about 20 years ago  her kidney function was much more abnormal after her chemotherapy.  She never developed stridor or angioedema due to IV dye.  I will refer her for cardiac catheterization, CT scan, and cardiothoracic surgical evaluation.  In terms of her dental health I did speak with her and her daughter about reaching out to her dentist to let us know whether her remaining 2 teeth need to be extracted or not.  Lastly given the patient's atrial fibrillation and right bundle branch block she is at elevated risk for requiring a permanent pacemaker following a TAVR procedure if this is indeed pursued.  I have personally reviewed the patients imaging data as summarized above.  I have reviewed the natural history of aortic stenosis with the patient and family members who are present today. We have discussed the limitations of medical therapy and the poor prognosis associated with symptomatic aortic stenosis. We have also reviewed potential treatment options, including palliative medical therapy, conventional surgical aortic valve replacement, and transcatheter aortic valve replacement. We discussed treatment options in the context of this patient's specific comorbid medical conditions.   All of the patient's questions were answered today. Will make further recommendations based on the results of studies outlined above.   Total time spent with patient today 60 minutes. This includes reviewing records, evaluating the patient and coordinating care.   Early Osmond, MD  03/02/2021 3:24 PM    [facility] [place], [place], [place]  [address] Phone: [phone]; Fax: [phone]

## 2021-03-02 NOTE — Progress Notes (Signed)
Pre Surgical Assessment: 5 M Walk Test  20M=16.66ft  5 Meter Walk Test- trial 1: 8.68 seconds 5 Meter Walk Test- trial 2: 9.33 seconds 5 Meter Walk Test- trial 3: 7.53 seconds 5 Meter Walk Test Average: 8.51 seconds

## 2021-03-02 NOTE — Patient Instructions (Signed)
Medication Instructions:  No changes *If you need a refill on your cardiac medications before your next appointment, please call your pharmacy*   Lab Work: Today: BMET, CBC   Testing/Procedures: Your physician has requested that you have a cardiac catheterization. Cardiac catheterization is used to diagnose and/or treat various heart conditions. Doctors may recommend this procedure for a number of different reasons. The most common reason is to evaluate chest pain. Chest pain can be a symptom of coronary artery disease (CAD), and cardiac catheterization can show whether plaque is narrowing or blocking your hearts arteries. This procedure is also used to evaluate the valves, as well as measure the blood flow and oxygen levels in different parts of your heart. For further information please visit [email or web address]. Please follow instruction sheet, as given.   Follow-Up: Per Structural Heart Valve Team   [facility] OFFICE [place], [place] [address] Dept: [phone] Loc: [place]  03/02/2021  You are scheduled for a Cardiac Catheterization on Wednesday, January 18 with Dr. Lenna Sciara.  1. Please arrive at the [facility] (Main Entrance A) at [facility]: [address] [place], [place] [address] at 9:30 AM (This time is two hours before your procedure to ensure your preparation). Free valet parking service is available.   Special note: Every effort is made to have your procedure done on time. Please understand that emergencies sometimes delay scheduled procedures.  2. Diet: Do not eat solid foods after midnight.  The patient may have clear liquids until 5am upon the day of the procedure.  3. Labs: You will need to have blood drawn today. You do not need to be fasting.  4. Medication instructions in preparation for your procedure:   Contrast Allergy:  No   Take warfarin up through Thursday, Jan 12 then stop until you are told to restart.  Hold Torsemide the morning of procedure  On the morning of your procedure, take your Aspirin 81 mg and any morning medicines NOT listed above.  You may use sips of water.  5. Plan for one night stay--bring personal belongings. 6. Bring a current list of your medications and current insurance cards. 7. You MUST have a responsible person to drive you home. 8. Someone MUST be with you the first 24 hours after you arrive home or your discharge will be delayed. 9. Please wear clothes that are easy to get on and off and wear slip-on shoes.  Thank you for allowing [place] to care for you!   -- [facility]

## 2021-03-03 LAB — CBC
Hematocrit: 38.3 % (ref 34.0–46.6)
Hemoglobin: 13 g/dL (ref 11.1–15.9)
MCH: 30 pg (ref 26.6–33.0)
MCHC: 33.9 g/dL (ref 31.5–35.7)
MCV: 89 fL (ref 79–97)
Platelets: 230 10*3/uL (ref 150–450)
RBC: 4.33 x10E6/uL (ref 3.77–5.28)
RDW: 13.3 % (ref 11.7–15.4)
WBC: 14.4 10*3/uL — ABNORMAL HIGH (ref 3.4–10.8)

## 2021-03-03 LAB — BASIC METABOLIC PANEL
BUN/Creatinine Ratio: 14 (ref 12–28)
BUN: 21 mg/dL (ref 8–27)
CO2: 22 mmol/L (ref 20–29)
Calcium: 9.7 mg/dL (ref 8.7–10.3)
Chloride: 101 mmol/L (ref 96–106)
Creatinine, Ser: 1.46 mg/dL — ABNORMAL HIGH (ref 0.57–1.00)
Glucose: 110 mg/dL — ABNORMAL HIGH (ref 70–99)
Potassium: 3.9 mmol/L (ref 3.5–5.2)
Sodium: 140 mmol/L (ref 134–144)
eGFR: 35 mL/min/{1.73_m2} — ABNORMAL LOW (ref >59)

## 2021-03-05 ENCOUNTER — Encounter: Payer: Self-pay | Admitting: Internal Medicine

## 2021-03-05 ENCOUNTER — Telehealth: Payer: Self-pay | Admitting: Cardiology

## 2021-03-05 NOTE — Telephone Encounter (Signed)
Sharyn Lull Mottinger DDS calling in regards to the patient clearance. She stated we needed to know if the patient could be cleared for surgery. Please advise

## 2021-03-05 NOTE — Telephone Encounter (Signed)
I called and s/w Dr. Deanna Artis, DDS in regard to phone message today. Per Dr. Deanna Artis, she is not needing cardiac clearance. Dr. Deanna Artis, DDS stated the pt is having a procedure with our office and she was asked by Dr. Ali Lowe give her input as to the pt's teeth stability, and if ok to continue with cardiac procedures. Pt is currently scheduled for a heart cath and TAVR with Dr. Ali Lowe. Per Dr. Deanna Artis the pt does have some decay, however she feels the pt is fine to continue with the plan of care. I did ask if Dr. Deanna Artis would like notes faxed to her as well, fax # [phone].  I will fax this note to Dr. Deanna Artis for pt's chart in her office. I will also forward to Dr. Ali Lowe.

## 2021-03-05 NOTE — Telephone Encounter (Signed)
I will hand this off to Theodosia Quay, RN. Pt is having a heart cath and TAVR procedure with Dr. Ali Lowe. The phone note this morning that was thought to be intended for our pre op team is not. See notes from earlier today.

## 2021-03-05 NOTE — Telephone Encounter (Signed)
I contacted Dr Francia Greaves office and requested a letter be faxed in regards to the pt being cleared to proceed with cardiac evaluation and cardiac surgery.

## 2021-03-06 NOTE — Telephone Encounter (Signed)
°  [facility]   MULTIDISCIPLINARY HEART VALVE TEAM   DENTAL RECOMMENDATION

## 2021-03-12 ENCOUNTER — Emergency Department (HOSPITAL_COMMUNITY): Payer: Medicare Other

## 2021-03-12 ENCOUNTER — Other Ambulatory Visit: Payer: Self-pay

## 2021-03-12 ENCOUNTER — Inpatient Hospital Stay (HOSPITAL_COMMUNITY)
Admission: EM | Admit: 2021-03-12 | Discharge: 2021-03-28 | DRG: 871 | Disposition: E | Payer: Medicare Other | Attending: Cardiology | Admitting: Cardiology

## 2021-03-12 ENCOUNTER — Telehealth: Payer: Self-pay

## 2021-03-12 DIAGNOSIS — I25110 Atherosclerotic heart disease of native coronary artery with unstable angina pectoris: Secondary | ICD-10-CM | POA: Diagnosis present

## 2021-03-12 DIAGNOSIS — J9811 Atelectasis: Secondary | ICD-10-CM | POA: Diagnosis present

## 2021-03-12 DIAGNOSIS — I130 Hypertensive heart and chronic kidney disease with heart failure and stage 1 through stage 4 chronic kidney disease, or unspecified chronic kidney disease: Secondary | ICD-10-CM | POA: Diagnosis present

## 2021-03-12 DIAGNOSIS — I4811 Longstanding persistent atrial fibrillation: Secondary | ICD-10-CM | POA: Diagnosis present

## 2021-03-12 DIAGNOSIS — I272 Pulmonary hypertension, unspecified: Secondary | ICD-10-CM | POA: Diagnosis present

## 2021-03-12 DIAGNOSIS — Z7189 Other specified counseling: Secondary | ICD-10-CM | POA: Diagnosis not present

## 2021-03-12 DIAGNOSIS — H3530 Unspecified macular degeneration: Secondary | ICD-10-CM | POA: Diagnosis present

## 2021-03-12 DIAGNOSIS — I340 Nonrheumatic mitral (valve) insufficiency: Secondary | ICD-10-CM | POA: Diagnosis not present

## 2021-03-12 DIAGNOSIS — N179 Acute kidney failure, unspecified: Secondary | ICD-10-CM | POA: Diagnosis present

## 2021-03-12 DIAGNOSIS — Z8249 Family history of ischemic heart disease and other diseases of the circulatory system: Secondary | ICD-10-CM

## 2021-03-12 DIAGNOSIS — R918 Other nonspecific abnormal finding of lung field: Secondary | ICD-10-CM | POA: Diagnosis not present

## 2021-03-12 DIAGNOSIS — L89311 Pressure ulcer of right buttock, stage 1: Secondary | ICD-10-CM | POA: Diagnosis present

## 2021-03-12 DIAGNOSIS — I4892 Unspecified atrial flutter: Secondary | ICD-10-CM | POA: Diagnosis not present

## 2021-03-12 DIAGNOSIS — J189 Pneumonia, unspecified organism: Secondary | ICD-10-CM | POA: Diagnosis not present

## 2021-03-12 DIAGNOSIS — R0602 Shortness of breath: Secondary | ICD-10-CM | POA: Diagnosis not present

## 2021-03-12 DIAGNOSIS — Z91018 Allergy to other foods: Secondary | ICD-10-CM

## 2021-03-12 DIAGNOSIS — E872 Acidosis, unspecified: Secondary | ICD-10-CM | POA: Diagnosis not present

## 2021-03-12 DIAGNOSIS — N184 Chronic kidney disease, stage 4 (severe): Secondary | ICD-10-CM | POA: Diagnosis present

## 2021-03-12 DIAGNOSIS — I10 Essential (primary) hypertension: Secondary | ICD-10-CM | POA: Diagnosis not present

## 2021-03-12 DIAGNOSIS — M5432 Sciatica, left side: Secondary | ICD-10-CM | POA: Diagnosis present

## 2021-03-12 DIAGNOSIS — Z79899 Other long term (current) drug therapy: Secondary | ICD-10-CM

## 2021-03-12 DIAGNOSIS — R079 Chest pain, unspecified: Secondary | ICD-10-CM | POA: Diagnosis not present

## 2021-03-12 DIAGNOSIS — K047 Periapical abscess without sinus: Secondary | ICD-10-CM | POA: Diagnosis present

## 2021-03-12 DIAGNOSIS — J9601 Acute respiratory failure with hypoxia: Secondary | ICD-10-CM | POA: Diagnosis not present

## 2021-03-12 DIAGNOSIS — I5021 Acute systolic (congestive) heart failure: Secondary | ICD-10-CM | POA: Diagnosis present

## 2021-03-12 DIAGNOSIS — Z79890 Hormone replacement therapy (postmenopausal): Secondary | ICD-10-CM

## 2021-03-12 DIAGNOSIS — Z20822 Contact with and (suspected) exposure to covid-19: Secondary | ICD-10-CM | POA: Diagnosis not present

## 2021-03-12 DIAGNOSIS — R6521 Severe sepsis with septic shock: Secondary | ICD-10-CM | POA: Diagnosis not present

## 2021-03-12 DIAGNOSIS — K5900 Constipation, unspecified: Secondary | ICD-10-CM | POA: Diagnosis not present

## 2021-03-12 DIAGNOSIS — G8929 Other chronic pain: Secondary | ICD-10-CM | POA: Diagnosis present

## 2021-03-12 DIAGNOSIS — Z885 Allergy status to narcotic agent status: Secondary | ICD-10-CM

## 2021-03-12 DIAGNOSIS — R911 Solitary pulmonary nodule: Secondary | ICD-10-CM | POA: Diagnosis not present

## 2021-03-12 DIAGNOSIS — R54 Age-related physical debility: Secondary | ICD-10-CM | POA: Diagnosis present

## 2021-03-12 DIAGNOSIS — E785 Hyperlipidemia, unspecified: Secondary | ICD-10-CM | POA: Diagnosis not present

## 2021-03-12 DIAGNOSIS — Z452 Encounter for adjustment and management of vascular access device: Secondary | ICD-10-CM | POA: Diagnosis not present

## 2021-03-12 DIAGNOSIS — L8990 Pressure ulcer of unspecified site, unspecified stage: Secondary | ICD-10-CM | POA: Diagnosis present

## 2021-03-12 DIAGNOSIS — N189 Chronic kidney disease, unspecified: Secondary | ICD-10-CM | POA: Diagnosis not present

## 2021-03-12 DIAGNOSIS — I5082 Biventricular heart failure: Secondary | ICD-10-CM | POA: Diagnosis present

## 2021-03-12 DIAGNOSIS — Z882 Allergy status to sulfonamides status: Secondary | ICD-10-CM

## 2021-03-12 DIAGNOSIS — I214 Non-ST elevation (NSTEMI) myocardial infarction: Secondary | ICD-10-CM | POA: Diagnosis present

## 2021-03-12 DIAGNOSIS — E039 Hypothyroidism, unspecified: Secondary | ICD-10-CM | POA: Diagnosis present

## 2021-03-12 DIAGNOSIS — M25552 Pain in left hip: Secondary | ICD-10-CM | POA: Diagnosis present

## 2021-03-12 DIAGNOSIS — Z886 Allergy status to analgesic agent status: Secondary | ICD-10-CM

## 2021-03-12 DIAGNOSIS — Z515 Encounter for palliative care: Secondary | ICD-10-CM

## 2021-03-12 DIAGNOSIS — R7401 Elevation of levels of liver transaminase levels: Secondary | ICD-10-CM | POA: Diagnosis not present

## 2021-03-12 DIAGNOSIS — I700 Atherosclerosis of aorta: Secondary | ICD-10-CM | POA: Diagnosis not present

## 2021-03-12 DIAGNOSIS — Z91012 Allergy to eggs: Secondary | ICD-10-CM

## 2021-03-12 DIAGNOSIS — I482 Chronic atrial fibrillation, unspecified: Secondary | ICD-10-CM | POA: Diagnosis not present

## 2021-03-12 DIAGNOSIS — Z888 Allergy status to other drugs, medicaments and biological substances status: Secondary | ICD-10-CM

## 2021-03-12 DIAGNOSIS — H548 Legal blindness, as defined in USA: Secondary | ICD-10-CM | POA: Diagnosis present

## 2021-03-12 DIAGNOSIS — E876 Hypokalemia: Secondary | ICD-10-CM | POA: Diagnosis not present

## 2021-03-12 DIAGNOSIS — I517 Cardiomegaly: Secondary | ICD-10-CM | POA: Diagnosis not present

## 2021-03-12 DIAGNOSIS — I501 Left ventricular failure: Secondary | ICD-10-CM | POA: Diagnosis not present

## 2021-03-12 DIAGNOSIS — R739 Hyperglycemia, unspecified: Secondary | ICD-10-CM | POA: Diagnosis present

## 2021-03-12 DIAGNOSIS — I4891 Unspecified atrial fibrillation: Secondary | ICD-10-CM | POA: Diagnosis not present

## 2021-03-12 DIAGNOSIS — Z66 Do not resuscitate: Secondary | ICD-10-CM | POA: Diagnosis present

## 2021-03-12 DIAGNOSIS — A419 Sepsis, unspecified organism: Secondary | ICD-10-CM | POA: Diagnosis not present

## 2021-03-12 DIAGNOSIS — J90 Pleural effusion, not elsewhere classified: Secondary | ICD-10-CM | POA: Diagnosis not present

## 2021-03-12 DIAGNOSIS — R0789 Other chest pain: Secondary | ICD-10-CM | POA: Diagnosis not present

## 2021-03-12 DIAGNOSIS — Z881 Allergy status to other antibiotic agents status: Secondary | ICD-10-CM

## 2021-03-12 DIAGNOSIS — I9589 Other hypotension: Secondary | ICD-10-CM | POA: Diagnosis present

## 2021-03-12 DIAGNOSIS — R570 Cardiogenic shock: Secondary | ICD-10-CM | POA: Diagnosis not present

## 2021-03-12 DIAGNOSIS — Z9221 Personal history of antineoplastic chemotherapy: Secondary | ICD-10-CM

## 2021-03-12 DIAGNOSIS — I509 Heart failure, unspecified: Secondary | ICD-10-CM

## 2021-03-12 DIAGNOSIS — I350 Nonrheumatic aortic (valve) stenosis: Secondary | ICD-10-CM | POA: Diagnosis not present

## 2021-03-12 DIAGNOSIS — R531 Weakness: Secondary | ICD-10-CM | POA: Diagnosis not present

## 2021-03-12 DIAGNOSIS — I080 Rheumatic disorders of both mitral and aortic valves: Secondary | ICD-10-CM | POA: Diagnosis present

## 2021-03-12 DIAGNOSIS — I2510 Atherosclerotic heart disease of native coronary artery without angina pectoris: Secondary | ICD-10-CM | POA: Diagnosis not present

## 2021-03-12 DIAGNOSIS — Z91048 Other nonmedicinal substance allergy status: Secondary | ICD-10-CM

## 2021-03-12 DIAGNOSIS — Z7901 Long term (current) use of anticoagulants: Secondary | ICD-10-CM

## 2021-03-12 DIAGNOSIS — Z743 Need for continuous supervision: Secondary | ICD-10-CM | POA: Diagnosis not present

## 2021-03-12 LAB — CBC WITH DIFFERENTIAL/PLATELET
Abs Immature Granulocytes: 0.08 10*3/uL — ABNORMAL HIGH (ref 0.00–0.07)
Basophils Absolute: 0 10*3/uL (ref 0.0–0.1)
Basophils Relative: 0 %
Eosinophils Absolute: 0 10*3/uL (ref 0.0–0.5)
Eosinophils Relative: 0 %
HCT: 39.5 % (ref 36.0–46.0)
Hemoglobin: 12 g/dL (ref 12.0–15.0)
Immature Granulocytes: 1 %
Lymphocytes Relative: 3 %
Lymphs Abs: 0.5 10*3/uL — ABNORMAL LOW (ref 0.7–4.0)
MCH: 29.3 pg (ref 26.0–34.0)
MCHC: 30.4 g/dL (ref 30.0–36.0)
MCV: 96.3 fL (ref 80.0–100.0)
Monocytes Absolute: 0.4 10*3/uL (ref 0.1–1.0)
Monocytes Relative: 3 %
Neutro Abs: 14.3 10*3/uL — ABNORMAL HIGH (ref 1.7–7.7)
Neutrophils Relative %: 93 %
Platelets: 356 10*3/uL (ref 150–400)
RBC: 4.1 MIL/uL (ref 3.87–5.11)
RDW: 14.7 % (ref 11.5–15.5)
WBC: 15.3 10*3/uL — ABNORMAL HIGH (ref 4.0–10.5)
nRBC: 0 % (ref 0.0–0.2)

## 2021-03-12 LAB — BETA-HYDROXYBUTYRIC ACID: Beta-Hydroxybutyric Acid: 0.14 mmol/L (ref 0.05–0.27)

## 2021-03-12 LAB — BASIC METABOLIC PANEL
Anion gap: 18 — ABNORMAL HIGH (ref 5–15)
BUN: 29 mg/dL — ABNORMAL HIGH (ref 8–23)
CO2: 17 mmol/L — ABNORMAL LOW (ref 22–32)
Calcium: 9.5 mg/dL (ref 8.9–10.3)
Chloride: 100 mmol/L (ref 98–111)
Creatinine, Ser: 2.09 mg/dL — ABNORMAL HIGH (ref 0.44–1.00)
GFR, Estimated: 23 mL/min — ABNORMAL LOW (ref >60)
Glucose, Bld: 387 mg/dL — ABNORMAL HIGH (ref 70–99)
Potassium: 4.1 mmol/L (ref 3.5–5.1)
Sodium: 135 mmol/L (ref 135–145)

## 2021-03-12 LAB — HEPATIC FUNCTION PANEL
ALT: 20 U/L (ref 0–44)
AST: 39 U/L (ref 15–41)
Albumin: 4 g/dL (ref 3.5–5.0)
Alkaline Phosphatase: 73 U/L (ref 38–126)
Bilirubin, Direct: 0.2 mg/dL (ref 0.0–0.2)
Indirect Bilirubin: 0.6 mg/dL (ref 0.3–0.9)
Total Bilirubin: 0.8 mg/dL (ref 0.3–1.2)
Total Protein: 7.4 g/dL (ref 6.5–8.1)

## 2021-03-12 LAB — TROPONIN I (HIGH SENSITIVITY): Troponin I (High Sensitivity): 616 ng/L (ref <18)

## 2021-03-12 LAB — PROTIME-INR
INR: 1.4 — ABNORMAL HIGH (ref 0.8–1.2)
Prothrombin Time: 17 seconds — ABNORMAL HIGH (ref 11.4–15.2)

## 2021-03-12 LAB — LACTIC ACID, PLASMA: Lactic Acid, Venous: 7.4 mmol/L (ref 0.5–1.9)

## 2021-03-12 LAB — HEMOGLOBIN A1C
Hgb A1c MFr Bld: 5.9 % — ABNORMAL HIGH (ref 4.8–5.6)
Mean Plasma Glucose: 122.63 mg/dL

## 2021-03-12 LAB — ABO/RH: ABO/RH(D): O POS

## 2021-03-12 MED ORDER — ASPIRIN 325 MG PO TABS
325.0000 mg | ORAL_TABLET | Freq: Once | ORAL | Status: AC
  Administered 2021-03-13: 325 mg via ORAL
  Filled 2021-03-12: qty 1

## 2021-03-12 MED ORDER — ASPIRIN EC 81 MG PO TBEC
81.0000 mg | DELAYED_RELEASE_TABLET | Freq: Every day | ORAL | Status: DC
  Administered 2021-03-14 – 2021-03-18 (×5): 81 mg via ORAL
  Filled 2021-03-12 (×7): qty 1

## 2021-03-12 MED ORDER — LACTATED RINGERS IV BOLUS
250.0000 mL | Freq: Once | INTRAVENOUS | Status: AC
  Administered 2021-03-13: 250 mL via INTRAVENOUS

## 2021-03-12 MED ORDER — LACTATED RINGERS IV SOLN: INTRAVENOUS | Status: AC

## 2021-03-12 MED ORDER — HEPARIN (PORCINE) 25000 UT/250ML-% IV SOLN
1000.0000 [IU]/h | INTRAVENOUS | Status: DC
  Administered 2021-03-13: 850 [IU]/h via INTRAVENOUS
  Administered 2021-03-13: 01:00:00 950 [IU]/h via INTRAVENOUS
  Administered 2021-03-15: 850 [IU]/h via INTRAVENOUS
  Administered 2021-03-16: 1000 [IU]/h via INTRAVENOUS
  Filled 2021-03-12 (×4): qty 250

## 2021-03-12 MED ORDER — HEPARIN BOLUS VIA INFUSION
4000.0000 [IU] | Freq: Once | INTRAVENOUS | Status: AC
  Administered 2021-03-13: 4000 [IU] via INTRAVENOUS
  Filled 2021-03-12: qty 4000

## 2021-03-12 MED ORDER — NITROGLYCERIN 0.4 MG SL SUBL: 0.4000 mg | SUBLINGUAL_TABLET | SUBLINGUAL | Status: DC | PRN

## 2021-03-12 NOTE — ED Triage Notes (Signed)
Pt BIB [facility] after chest pain/SOB starting around noon. Pt was sedated this morning for dental procedure and pain started after returning home. Patient endorses pain at 6/10 now, down from 9/10 after 2 doses of nitro administered by EMS. EMS reports 12 lead unremarkable. Patient A&Ox4, uses walker at baseline.

## 2021-03-12 NOTE — H&P (Addendum)
Cardiology Admission History and Physical:   Patient ID: [name] MRN: [id number]; DOB: 1931/10/07   Admission date: 03/19/2021  PCP:  Lavone Orn, MD   [facility] Providers Cardiologist:  Candee Furbish, MD        Chief Complaint: Chest pain  Patient Profile:   [name] is a 88 y.o. female with a PMH of severe AS (AVA 0.68, mGrad 32, Vmax 3.9, DI 0.23), A. fib/a flutter (on warfarin), HTN, HLD, CKD (bl sCr ~1.4), hypothyroidism, and blindness from macular degeneration who is being seen 03/21/2021 for the evaluation of chest pain.  History of Present Illness:   The following history was obtained from patient.  Ms. Brawley was recently seen by [facility] cardiology on 03/02/2021 for ongoing symptomatic severe AS.  During that visit it was determined that she would benefit from aortic valve replacement and was in the process of being scheduled for LHC.  She has had dentition and underwent a scheduled teeth extraction earlier today by her dentist.  She reports that she was under general anesthesia for the procedure, but I cannot corroborate this.  Upon returning home from her tooth extraction she took at that and was later awoken by severe left-sided chest pressure that felt like " an elephant sitting on her chest."  She has never had this type of chest pain before.  Her symptoms were associated with SOB, nausea, and feelings of anxiousness.  She denies palpitations, syncope, presyncope, peripheral edema, PND, orthopnea, abdominal pain, emesis, bleeding, or weakness/numbness. She reports having some DOE for several months prior to this episode; however, she denies ever having any chest pain on exertion. Given her symptoms she called EMS who gave her sublingual nitroglycerin which improved her pain.  She was brought to [facility] for further evaluation.  In the ED her VS were afebrile, HR 102, BP 148/93, RR 18, satting 90% on RA.  Labs notable for troponin 616, WBC 15.3 with a neutrophilic  predominance, sCr 2.1, bicarb 17, glucose 387, lactate 7.4, and INR 1.4.  CXR showed small left pleural effusion but was otherwise unremarkable.  EKG showed A. fib with bigeminy.  She was started on a heparin gtt. and admitted to cardiology.  My bedside POCUS was concerning for septal hypokinesis.   Past Medical History:  Diagnosis Date   Atrial fibrillation ([place])    Blood transfusion    " no reaction to transfusion"   Cancer ([place])    Complication of anesthesia    sensitive to drugs   Dysrhythmia    atrial flutter   Headache(784.0)    Hyperlipidemia    Hypertension    Hypothyroidism    Impaired fasting glucose    Kidney disease, chronic, stage IV (GFR 15-29 ml/min) (HCC)    Leiomyosarcoma of uterus (HCC)    Chemotherapy, Magrinat and Clarke-Pearson   Macular degeneration    Migraine headache    Improved after menopause   Mitral regurgitation 11/27/2012   Renal disorder    Shortness of breath    Shortness of breath    Cardiology Eval., possible diastolic dysfunctio   UTI (urinary tract infection)    Frequent   Visual loss, right eye    Left ocular accident   Vitamin D deficiency    Wrist fracture, right 12/2009    Past Surgical History:  Procedure Laterality Date   BLEPHAROPLASTY     Cataract Surgery Bilateral    NERVE BIOPSY     TONSILLECTOMY     TOTAL ABDOMINAL HYSTERECTOMY  W/ BILATERAL SALPINGOOPHORECTOMY  01/2012   , Partial resection of the sigmoid colon     Medications Prior to Admission: Prior to Admission medications   Medication Sig Start Date End Date Taking? Authorizing Provider  acetaminophen (TYLENOL) 500 MG tablet Take 1,000 mg by mouth every 4 (four) hours as needed for moderate pain, headache or mild pain.   Yes [provider]  Cholecalciferol (VITAMIN D) 2000 UNITS tablet Take 2,000 Units by mouth daily.   Yes [provider]  ciprofloxacin (CIPRO) 250 MG tablet Take 250 mg by mouth 2 (two) times daily as needed (for cystitis  symptoms). For symptoms    Yes [provider]  diltiazem (CARDIZEM CD) 180 MG 24 hr capsule TAKE 1 CAPSULE IN THE MORNING ON AN EMPTY STOMACH. 01/23/21  Yes Jerline Pain, MD  fluocinonide (LIDEX) 0.05 % external solution 1 application daily as needed (Eczema). 10/16/18  Yes [provider]  levothyroxine (SYNTHROID, LEVOTHROID) 75 MCG tablet Take 75 mcg by mouth every other day. Alternate with 50 mcg with breakfast   Yes [provider]  lisinopril (PRINIVIL,ZESTRIL) 10 MG tablet Take 10 mg by mouth every evening.   Yes [provider]  loratadine (CLARITIN) 10 MG tablet Take 10 mg by mouth daily as needed for allergies.    Yes [provider]  metoprolol succinate (TOPROL-XL) 100 MG 24 hr tablet TAKE ONE TABLET EVERY DAY WITH OR IMMEDIATELY FOLLOWING A MEAL Patient taking differently: 100 mg every evening. 01/23/21  Yes Jerline Pain, MD  Misc Natural Products (LUTEIN 20) CAPS Take 20 mg by mouth daily.   Yes [provider]  potassium chloride SA (K-DUR,KLOR-CON) 20 MEQ tablet Take 20 mEq by mouth daily.    Yes [provider]  SYNTHROID 50 MCG tablet Take 50 mcg by mouth every other day. Alternate with 75 mcg 02/24/14  Yes [provider]  torsemide (DEMADEX) 20 MG tablet Take 20 mg by mouth every evening.  08/11/13  Yes [provider]  triamcinolone cream (KENALOG) 0.1 % 1 application daily as needed (Eczema). 10/07/18  Yes [provider]  warfarin (COUMADIN) 1 MG tablet TAKE 1- 1.5 TABLETS DAILY AS DIRECTED by coumadin clinic Patient taking differently: Take 1-1.5 mg by mouth See admin instructions. Take 1 mg daily EXCEPT: On Monday take 1.5 mg after supper Or DIRECTED by coumadin clinic 12/25/20  Yes Jerline Pain, MD     Allergies:    Allergies  Allergen Reactions   Plendil [Felodipine]    Aspirin Other (See Comments)    Stomach upset   Barbiturates Other (See Comments)    Poorly tolerated    Eggs Or Egg-Derived Products Other (See Comments)    diarrhea   Epinephrine Other (See Comments)    Fever    Erythromycin Base Nausea And Vomiting    All mycins   Felodipine Er Other (See Comments)    headache   Morphine And Related Other (See Comments)    Rash    Oxycodone Hcl Other (See Comments)    Heart race   Penicillins Other (See Comments)    Fever    Pravachol Other (See Comments)    Muscle ache   Soy Allergy Other (See Comments)    shaking   Sulfa Antibiotics Other (See Comments)    rash   Wheat Other (See Comments)    Stomach upset   Adhesive [Tape] Other (See Comments)    Rash     Social History:  Social History   Socioeconomic History   Marital status: Single    Spouse name: Not on file   Number of children: Not on file   Years of education: Not on file   Highest education level: Not on file  Occupational History   Not on file  Tobacco Use   Smoking status: Never   Smokeless tobacco: Never  Vaping Use   Vaping Use: Never used  Substance and Sexual Activity   Alcohol use: No   Drug use: No   Sexual activity: Never    Birth control/protection: Post-menopausal  Other Topics Concern   Not on file  Social History Narrative   Not on file   Social Determinants of Health   Financial Resource Strain: Not on file  Food Insecurity: Not on file  Transportation Needs: Not on file  Physical Activity: Not on file  Stress: Not on file  Social Connections: Not on file  Intimate Partner Violence: Not on file    Family History:   The patient's family history includes Cancer in her father; Heart disease in her maternal grandfather, maternal grandmother, and mother; Hypertension in her mother and sister; Peripheral Artery Disease in her sister. There is no history of Breast cancer.    ROS:  Please see the history of present illness.  All other ROS reviewed and negative.     Physical Exam/Data:   Vitals:   03/27/2021 2006 03/15/2021 2009 03/11/2021 2130   BP:  (!) 148/93 (!) 134/97  Pulse:  (!) 50 92  Resp:  18 (!) 21  Temp:  98.2 F (36.8 C)   TempSrc:  Oral   SpO2: 98% 98% 98%   No intake or output data in the 24 hours ending 03/17/2021 2333 Last 3 Weights 03/02/2021 01/08/2021 01/13/2020  Weight (lbs) 151 lb 155 lb 153 lb  Weight (kg) 68.493 kg 70.308 kg 69.4 kg     There is no height or weight on file to calculate BMI.  General: Pleasant, elderly female in NAD, laying in bed comfortably HEENT: Atraumatic, normocephalic, edentulous Neck: Supple, no JVD Vascular: Distal pulses 2+ bilaterally   Cardiac: Tachycardic with irregularly irregular rhythm, blunted S2, harsh III/VI systolic murmur heard throughout the precordium Lungs: Faint bibasilar Rales, otherwise clear Abd: soft, nontender, no hepatomegaly, palpable periumbilical mass consistent with a ventral hernia, hernia is reducible Ext: WWP, very trace edema in the ankles Musculoskeletal:  No deformities, BUE and BLE strength grossly normal Skin: warm and dry  Neuro: Grossly normal Psych:  Normal affect    EKG:  The ECG that was done  was personally reviewed and demonstrates Afib with RVR and bigeminy.     Relevant CV Studies:  TTE 02/12/21:  IMPRESSIONS    1. There is moderate-to-severe aortic stenosis with AVA 0.68cm2 by  continuity, mean gradient 7mmHg, peak gradient 21mmHg, Vmax 3.9 m/s, DI  0.23.   2. Left ventricular ejection fraction, by estimation, is 55 to 60%. The  left ventricle has normal function. The left ventricle has no regional  wall motion abnormalities. There is mild concentric left ventricular  hypertrophy. Diastolic function is  indeterminant due to atrial fibrillation.   3. Right ventricular systolic function is normal. The right ventricular  size is normal. There is moderately elevated pulmonary artery systolic  pressure. The estimated right ventricular systolic pressure is 16.1 mmHg.   4. Left atrial size was severely dilated.   5. Right  atrial size was severely dilated.   6. The mitral valve is  degenerative. Mild mitral valve regurgitation.   7. The aortic valve is tricuspid. There is moderate calcification of the  aortic valve. There is severe thickening of the aortic valve. Aortic valve  regurgitation is trivial. Severe aortic valve stenosis.   8. The inferior vena cava is normal in size with <50% respiratory  variability, suggesting right atrial pressure of 8 mmHg.  Laboratory Data:  High Sensitivity Troponin:   Recent Labs  Lab 02/27/2021 2029  TROPONINIHS 616*      Chemistry Recent Labs  Lab 03/15/2021 2029  NA 135  K 4.1  CL 100  CO2 17*  GLUCOSE 387*  BUN 29*  CREATININE 2.09*  CALCIUM 9.5  GFRNONAA 23*  ANIONGAP 18*    Recent Labs  Lab 03/11/2021 2227  PROT 7.4  ALBUMIN 4.0  AST 39  ALT 20  ALKPHOS 73  BILITOT 0.8   Lipids No results for input(s): CHOL, TRIG, HDL, LABVLDL, LDLCALC, CHOLHDL in the last 168 hours. Hematology Recent Labs  Lab 02/28/2021 2029  WBC 15.3*  RBC 4.10  HGB 12.0  HCT 39.5  MCV 96.3  MCH 29.3  MCHC 30.4  RDW 14.7  PLT 356   Thyroid No results for input(s): TSH, FREET4 in the last 168 hours. BNPNo results for input(s): BNP, PROBNP in the last 168 hours.  DDimer No results for input(s): DDIMER in the last 168 hours.   Radiology/Studies:  DG Chest Portable 1 View  Result Date: 03/24/2021 CLINICAL DATA:  Chest pain and shortness of breath. EXAM: PORTABLE CHEST 1 VIEW COMPARISON:  April 10, 2011 FINDINGS: Mild atelectasis is seen within the bilateral lung bases. There is a small left pleural effusion. No pneumothorax is identified. The cardiac silhouette is mildly enlarged. There is marked severity calcification of the thoracic aorta. Moderate to marked severity dextroscoliosis of the thoracic spine is seen with multilevel degenerative changes. IMPRESSION: 1. Mild bibasilar atelectasis with a small left pleural effusion. 2. Mild cardiomegaly. Electronically Signed    By: Virgina Norfolk M.D.   On: 03/24/2021 20:47     Assessment and Plan:   LOUIE MEADERS is a 88 y.o. female with a PMH of severe AS (AVA 0.68, mGrad 32, Vmax 3.9, DI 0.23), A. 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  #NSTEMI Type 2 vs. Type 1 :: Patient presented with acute onset chest pressure and is found to have elevated troponins following a recent dental procedure.  These findings are consistent with an NSTEMI.  It is unclear to me at this time whether or not the patient had plaque rupture or demand ischemia in the setting of severe AS.  Regardless, the patient warrants coronary evaluation via a LHC.  She has some underlying CKD and currently has an AKI, so there would need to be a discussion about the risks of further renal injury from contrast prior to [facility].  For now we will treat for ACS with heparin and aspirin.  We will avoid ACEI in the setting of AKI.  Not given a statin due to history of statin myalgias.  By my physical exam the patient appears to be euvolemic and her IVC on POCUS looks normal.  She does not have clinical signs of heart failure.  Since she does have a new AKI and does not appear floridly volume overloaded, I will give some IV fluid as precath hydration and to see if  this helps her preload. -NPO at MN for likely LHC tomorrow -Load with ASA followed by ASA 81 mg daily (prior allergy to ASA listed as stomach upset so will rechallenge in the setting of ACS) -Continue heparin GTT -Hold home warfarin heparin -Fractionate home metoprolol to 25 mg every 6 hours -Not giving ACEI due to AKI -Not giving statin due to history of myalgias -Avoid nitroglycerin given preload dependence with severe AS -TTE -Check lipid panel, TSH, A1c -Give 250 cc LR followed by an LR infusion @50  cc/hr x10 hrs for precath hydration  #Severe Symptomatic AS ::  Patient has known severe AS and is now presenting with an NSTEMI.  I am unsure at this point if the AS is the main driver of her NSTEMI or if a primary coronary event occurred.  Regardless, this patient would likely benefit from aortic valve replacement.  A TAVR seems most appropriate.  First she will need an LHC prior to fixing her valve. -LHC as above -Avoid nitroglycerin given preload dependence -Fractionate metoprolol as above -TAVR eval pending LHC  #Afib with RVR #Atrial Flutter :: Currently in A. fib with RVR but rates relatively well controlled.  She takes diltiazem and metoprolol at home for rate control.  We will hold home diltiazem given my concern for a slightly reduced EF by POCUS.  I will fractionate metoprolol. -Start metoprolol titrate 25 every 6 hours -Hold home diltiazem -Hold home warfarin -Continue heparin as above -Maintain telemetry -Keep K >4, Mg>2  #HTN #HLD -metoprolol as above -lipid panel  #AKI on CKD :: Baseline creatinine ~1.4 and now 2.1 on admission.  The differential for this AKI includes prerenal vs. cardiorenal syndrome vs. intrinsic.  My suspicion is that she perhaps has a prerenal AKI in the setting of hemodynamic shifts related to whatever sedation she got from her dental procedure.  It would not take much for this to happen with her severe AS.  She could also have a cardiorenal syndrome, but she does not have clinical signs of heart failure and feels pretty warm on my exam.  I will challenge her with IV fluids and check a BMP.  If things worsen with IV fluids then can consider diuresis. -IV fluids as above -Hold home torsemide 20 Mg every evening -Avoid nephrotoxic medications -Urinalysis, urine lites -BNP  #Hypothyroidism -Continue home Synthroid 50 mcg and 75 mcg every other day alternating doses  #Leukocytosis ::Likely reactive in the setting of ACS. -Repeat CBC   #Hyperglycemia ::Likely hyperglycemic in the setting of a stress response  from ACS. A1c is 5.9%. Will check BG levels q6h while NPO but can likely stop checking if her sugars normalize.      Risk Assessment/Risk Scores:    TIMI Risk Score for Unstable Angina or Non-ST Elevation MI:   The patient's TIMI risk score is 3, which indicates a 13% risk of all cause mortality, new or recurrent myocardial infarction or need for urgent revascularization in the next 14 days.    CHA2DS2-VASc Score = 5  This indicates a 7.2% annual risk of stroke. The patient's score is based upon: age, female, HTN, aortic stenosis     Severity of Illness: The appropriate patient status for this patient is INPATIENT. Inpatient status is judged to be reasonable and necessary in order to provide the required intensity of service to ensure the patient's safety. The patient's presenting symptoms, physical exam findings, and initial radiographic and laboratory data in the context of their chronic comorbidities is felt to  place them at high risk for further clinical deterioration. Furthermore, it is not anticipated that the patient will be medically stable for discharge from the hospital within 2 midnights of admission.   * I certify that at the point of admission it is my clinical judgment that the patient will require inpatient hospital care spanning beyond 2 midnights from the point of admission due to high intensity of service, high risk for further deterioration and high frequency of surveillance required.*   For questions or updates, please contact [place] Please consult [email or web address] for contact info under     Signed, Hershal Coria, MD  03/07/2021 11:33 PM

## 2021-03-12 NOTE — Progress Notes (Signed)
ANTICOAGULATION CONSULT NOTE - Initial Consult  Pharmacy Consult for IV heparin Indication: chest pain/ACS  Allergies  Allergen Reactions   Plendil [Felodipine]    Aspirin Other (See Comments)    Stomach upset   Barbiturates Other (See Comments)    Poorly tolerated   Eggs Or Egg-Derived Products Other (See Comments)    diarrhea   Epinephrine Other (See Comments)    Fever    Erythromycin Base Nausea And Vomiting    All mycins   Felodipine Er Other (See Comments)    headache   Morphine And Related Other (See Comments)    Rash    Oxycodone Hcl Other (See Comments)    Heart race   Penicillins Other (See Comments)    Fever    Pravachol Other (See Comments)    Muscle ache   Soy Allergy Other (See Comments)    shaking   Sulfa Antibiotics Other (See Comments)    rash   Wheat Other (See Comments)    Stomach upset   Adhesive [Tape] Other (See Comments)    Rash     Patient Measurements:   Heparin Dosing Weight: 68.5kg  Vital Signs: Temp: 98.2 F (36.8 C) (01/16 2009) Temp Source: Oral (01/16 2009) BP: 148/93 (01/16 2009) Pulse Rate: 50 (01/16 2009)  Labs: Recent Labs    03/22/2021 2029  HGB 12.0  HCT 39.5  PLT 356  CREATININE 2.09*  TROPONINIHS 616*    Estimated Creatinine Clearance: 17.6 mL/min (A) (by C-G formula based on SCr of 2.09 mg/dL (H)).   Medical History: Past Medical History:  Diagnosis Date   Atrial fibrillation ([place])    Blood transfusion    " no reaction to transfusion"   Cancer ([place])    Complication of anesthesia    sensitive to drugs   Dysrhythmia    atrial flutter   Headache(784.0)    Hyperlipidemia    Hypertension    Hypothyroidism    Impaired fasting glucose    Kidney disease, chronic, stage IV (GFR 15-29 ml/min) (HCC)    Leiomyosarcoma of uterus (HCC)    Chemotherapy, Magrinat and Clarke-Pearson   Macular degeneration    Migraine headache    Improved after menopause   Mitral regurgitation 11/27/2012   Renal disorder     Shortness of breath    Shortness of breath    Cardiology Eval., possible diastolic dysfunctio   UTI (urinary tract infection)    Frequent   Visual loss, right eye    Left ocular accident   Vitamin D deficiency    Wrist fracture, right 12/2009   Assessment: 52 YOF presenting to ED for chest pain. PMH significant for atrial flutter on chronic anticoagulation with warfarin. Patient was scheduled to have left heart catherization on Wednesday 1/18 and was instructed to stop taking warfarin on Thursday 1/12 in preparation for cath. INR on admission 1.4. Pharmacy to dose IV heparin.   Hgb 12, platelets are within normal limits. Scr is elevated from baseline at 2.09.   Goal of Therapy:  Heparin level 0.3-0.7 units/ml Monitor platelets by anticoagulation protocol: Yes   Plan:  IV heparin 4000 units bolus x 1 Start IV heparin gtt @ 950 units/h 8h heparin level check Daily heparin level, CBC Monitor for signs and symptoms of bleeding Follow-up plans to restart warfarin post-cath  Thank you for involving pharmacy in this patient's care.  Elita Quick, PharmD PGY1 Ambulatory Care Pharmacy Resident 02/26/2021 9:48 PM  **Pharmacist phone directory can be found on [email or web address]  listed under [place]**

## 2021-03-12 NOTE — ED Provider Notes (Signed)
[facility] EMERGENCY DEPARTMENT Provider Note   CSN: [id number] Arrival date & time: 02/26/2021  1959     History  Chief Complaint  Patient presents with   Chest Pain   Shortness of Breath    [name] is a 88 y.o. female.   Chest Pain Associated symptoms: shortness of breath   Shortness of Breath Associated symptoms: chest pain   Patient presents with chest pain or shortness of breath.  Has a known severe aortic stenosis.  Is scheduled for heart catheterization in 2 days.  Had her last 2 teeth extracted earlier today.  Done under sedation.  States after she got home she developed anterior chest pain.  Pressure.  Improved by nitro.  Has had episodes like this in the past that have gotten better with eating.  States eating did not help her today.  Also feels a little anxious.  Had stopped her Coumadin for her A. fib for the heart catheterization.  Stopped 3 days ago.    Home Medications Prior to Admission medications   Medication Sig Start Date End Date Taking? Authorizing Provider  acetaminophen (TYLENOL) 500 MG tablet Take 1,000 mg by mouth every 4 (four) hours as needed for moderate pain, headache or mild pain.   Yes [provider]  Cholecalciferol (VITAMIN D) 2000 UNITS tablet Take 2,000 Units by mouth daily.   Yes [provider]  ciprofloxacin (CIPRO) 250 MG tablet Take 250 mg by mouth 2 (two) times daily as needed (for cystitis symptoms). For symptoms    Yes [provider]  diltiazem (CARDIZEM CD) 180 MG 24 hr capsule TAKE 1 CAPSULE IN THE MORNING ON AN EMPTY STOMACH. 01/23/21  Yes Jerline Pain, MD  fluocinonide (LIDEX) 0.05 % external solution 1 application daily as needed (Eczema). 10/16/18  Yes [provider]  levothyroxine (SYNTHROID, LEVOTHROID) 75 MCG tablet Take 75 mcg by mouth every other day. Alternate with 50 mcg with breakfast   Yes [provider]  lisinopril (PRINIVIL,ZESTRIL) 10 MG tablet  Take 10 mg by mouth every evening.   Yes [provider]  loratadine (CLARITIN) 10 MG tablet Take 10 mg by mouth daily as needed for allergies.    Yes [provider]  metoprolol succinate (TOPROL-XL) 100 MG 24 hr tablet TAKE ONE TABLET EVERY DAY WITH OR IMMEDIATELY FOLLOWING A MEAL Patient taking differently: 100 mg every evening. 01/23/21  Yes Jerline Pain, MD  Misc Natural Products (LUTEIN 20) CAPS Take 20 mg by mouth daily.   Yes [provider]  potassium chloride SA (K-DUR,KLOR-CON) 20 MEQ tablet Take 20 mEq by mouth daily.    Yes [provider]  SYNTHROID 50 MCG tablet Take 50 mcg by mouth every other day. Alternate with 75 mcg 02/24/14  Yes [provider]  torsemide (DEMADEX) 20 MG tablet Take 20 mg by mouth every evening.  08/11/13  Yes [provider]  triamcinolone cream (KENALOG) 0.1 % 1 application daily as needed (Eczema). 10/07/18  Yes [provider]  warfarin (COUMADIN) 1 MG tablet TAKE 1- 1.5 TABLETS DAILY AS DIRECTED by coumadin clinic Patient taking differently: Take 1-1.5 mg by mouth See admin instructions. Take 1 mg daily EXCEPT: On Monday take 1.5 mg after supper Or DIRECTED by coumadin clinic 12/25/20  Yes Jerline Pain, MD      Allergies    Plendil [felodipine], Aspirin, Barbiturates, Eggs or egg-derived products, Epinephrine, Erythromycin base, Felodipine er, Morphine and related, Oxycodone hcl, Penicillins,  Pravachol, Soy allergy, Sulfa antibiotics, Wheat, and Adhesive [tape]    Review of Systems   Review of Systems  Constitutional:  Negative for appetite change.  Respiratory:  Positive for shortness of breath.   Cardiovascular:  Positive for chest pain.   Physical Exam Updated Vital Signs BP (!) 134/97    Pulse 92    Temp 98.2 F (36.8 C) (Oral)    Resp (!) 21    SpO2 98%  Physical Exam Vitals reviewed.  Cardiovascular:     Heart sounds: Murmur heard.  Systolic murmur is present.  Chest:      Chest wall: Tenderness present.  Abdominal:     Tenderness: There is no abdominal tenderness.  Musculoskeletal:     Right lower leg: Edema present.     Left lower leg: Edema present.  Skin:    General: Skin is warm.     Capillary Refill: Capillary refill takes less than 2 seconds.  Neurological:     Mental Status: She is alert.    ED Results / Procedures / Treatments   Labs (all labs ordered are listed, but only abnormal results are displayed) Labs Reviewed  BASIC METABOLIC PANEL - Abnormal; Notable for the following components:      Result Value   CO2 17 (*)    Glucose, Bld 387 (*)    BUN 29 (*)    Creatinine, Ser 2.09 (*)    GFR, Estimated 23 (*)    Anion gap 18 (*)    All other components within normal limits  CBC WITH DIFFERENTIAL/PLATELET - Abnormal; Notable for the following components:   WBC 15.3 (*)    Neutro Abs 14.3 (*)    Lymphs Abs 0.5 (*)    Abs Immature Granulocytes 0.08 (*)    All other components within normal limits  TROPONIN I (HIGH SENSITIVITY) - Abnormal; Notable for the following components:   Troponin I (High Sensitivity) 616 (*)    All other components within normal limits  RESP PANEL BY RT-PCR (FLU A&B, COVID) ARPGX2  PROTIME-INR  LACTIC ACID, PLASMA  BLOOD GAS, VENOUS  HEPATIC FUNCTION PANEL  BETA-HYDROXYBUTYRIC ACID  TYPE AND SCREEN    EKG None  Radiology DG Chest Portable 1 View  Result Date: 03/11/2021 CLINICAL DATA:  Chest pain and shortness of breath. EXAM: PORTABLE CHEST 1 VIEW COMPARISON:  April 10, 2011 FINDINGS: Mild atelectasis is seen within the bilateral lung bases. There is a small left pleural effusion. No pneumothorax is identified. The cardiac silhouette is mildly enlarged. There is marked severity calcification of the thoracic aorta. Moderate to marked severity dextroscoliosis of the thoracic spine is seen with multilevel degenerative changes. IMPRESSION: 1. Mild bibasilar atelectasis with a small left pleural effusion. 2.  Mild cardiomegaly. Electronically Signed   By: Virgina Norfolk M.D.   On: 03/26/2021 20:47    Procedures Procedures    Medications Ordered in ED Medications - No data to display  ED Course/ Medical Decision Making/ A&P                           Medical Decision Making Amount and/or Complexity of Data Reviewed External Data Reviewed: notes. Labs: ordered. Decision-making details documented in ED Course. Radiology: independent interpretation performed. Decision-making details documented in ED Course. ECG/medicine tests:  Decision-making details documented in ED Course.  Risk Decision regarding hospitalization.   Patient presents with chest pain.  Anterior chest.  Began after dental procedure today.  Reportedly  was sedated.  Has known severe aortic stenosis but has not had vessel imaging previously.  Creatinine increased.  Chest x-ray reviewed by me.  Does show cardiomegaly.  Lab work reviewed and interpreted.  Shows mildly elevated creatinine and hyperglycemia.  Does have hyperglycemia.  Small anion gap.  Potentially could be mild DKA.  Discussed with Dr. Conley Canal from cardiology.  Will admit to his service.  We will start on heparin since is likely subtherapeutic on Coumadin.  CRITICAL CARE Performed by: Davonna Belling Total critical care time: 30 minutes Critical care time was exclusive of separately billable procedures and treating other patients. Critical care was necessary to treat or prevent imminent or life-threatening deterioration. Critical care was time spent personally by me on the following activities: development of treatment plan with patient and/or surrogate as well as nursing, discussions with consultants, evaluation of patient's response to treatment, examination of patient, obtaining history from patient or surrogate, ordering and performing treatments and interventions, ordering and review of laboratory studies, ordering and review of radiographic studies, pulse  oximetry and re-evaluation of patient's condition.         Final Clinical Impression(s) / ED Diagnoses Final diagnoses:  NSTEMI (non-ST elevated myocardial infarction) [facility])    Rx / [place] Orders ED Discharge Orders     None         Davonna Belling, MD 03/11/2021 2145

## 2021-03-12 NOTE — Telephone Encounter (Signed)
Call received directly in triage. Patient reports that she had some teeth extracted today. She reports that ever since her procedure she has been having some pressure in her chest. She states it is not painful but feels heavy. She denies any associated symptoms. She reports that she ate some food and it helped. She reports that she has had a similar feeling in the past and is resolved after she ate some food. She would like to know if she should still have her cath on Wednesday. I have advised her to keep cath scheduled as planned. Advised if chest discomfort continues/worsens and if she develops any other symptoms to go to the ER. Patient verbalized understanding.

## 2021-03-13 ENCOUNTER — Inpatient Hospital Stay (HOSPITAL_COMMUNITY): Payer: Medicare Other

## 2021-03-13 ENCOUNTER — Encounter (HOSPITAL_COMMUNITY): Admission: EM | Disposition: E | Payer: Self-pay | Source: Home / Self Care | Attending: Cardiology

## 2021-03-13 DIAGNOSIS — I5021 Acute systolic (congestive) heart failure: Secondary | ICD-10-CM

## 2021-03-13 DIAGNOSIS — I214 Non-ST elevation (NSTEMI) myocardial infarction: Secondary | ICD-10-CM | POA: Diagnosis not present

## 2021-03-13 DIAGNOSIS — R570 Cardiogenic shock: Secondary | ICD-10-CM

## 2021-03-13 DIAGNOSIS — R6510 Systemic inflammatory response syndrome (SIRS) of non-infectious origin without acute organ dysfunction: Secondary | ICD-10-CM

## 2021-03-13 DIAGNOSIS — I482 Chronic atrial fibrillation, unspecified: Secondary | ICD-10-CM

## 2021-03-13 DIAGNOSIS — A419 Sepsis, unspecified organism: Secondary | ICD-10-CM | POA: Diagnosis present

## 2021-03-13 DIAGNOSIS — I350 Nonrheumatic aortic (valve) stenosis: Secondary | ICD-10-CM

## 2021-03-13 LAB — RESP PANEL BY RT-PCR (FLU A&B, COVID) ARPGX2
Influenza A by PCR: NEGATIVE
Influenza B by PCR: NEGATIVE
SARS Coronavirus 2 by RT PCR: NEGATIVE

## 2021-03-13 LAB — LIPID PANEL
Cholesterol: 200 mg/dL (ref 0–200)
HDL: 68 mg/dL (ref >40)
LDL Cholesterol: 116 mg/dL — ABNORMAL HIGH (ref 0–99)
Total CHOL/HDL Ratio: 2.9 RATIO
Triglycerides: 79 mg/dL (ref <150)
VLDL: 16 mg/dL (ref 0–40)

## 2021-03-13 LAB — CBG MONITORING, ED
Glucose-Capillary: 140 mg/dL — ABNORMAL HIGH (ref 70–99)
Glucose-Capillary: 158 mg/dL — ABNORMAL HIGH (ref 70–99)

## 2021-03-13 LAB — BASIC METABOLIC PANEL
Anion gap: 15 (ref 5–15)
Anion gap: 17 — ABNORMAL HIGH (ref 5–15)
BUN: 27 mg/dL — ABNORMAL HIGH (ref 8–23)
BUN: 35 mg/dL — ABNORMAL HIGH (ref 8–23)
CO2: 17 mmol/L — ABNORMAL LOW (ref 22–32)
CO2: 21 mmol/L — ABNORMAL LOW (ref 22–32)
Calcium: 8.5 mg/dL — ABNORMAL LOW (ref 8.9–10.3)
Calcium: 9.4 mg/dL (ref 8.9–10.3)
Chloride: 105 mmol/L (ref 98–111)
Chloride: 101 mmol/L (ref 98–111)
Creatinine, Ser: 1.74 mg/dL — ABNORMAL HIGH (ref 0.44–1.00)
Creatinine, Ser: 1.89 mg/dL — ABNORMAL HIGH (ref 0.44–1.00)
GFR, Estimated: 28 mL/min — ABNORMAL LOW (ref >60)
GFR, Estimated: 25 mL/min — ABNORMAL LOW (ref >60)
Glucose, Bld: 195 mg/dL — ABNORMAL HIGH (ref 70–99)
Glucose, Bld: 152 mg/dL — ABNORMAL HIGH (ref 70–99)
Potassium: 4.2 mmol/L (ref 3.5–5.1)
Potassium: 5 mmol/L (ref 3.5–5.1)
Sodium: 137 mmol/L (ref 135–145)
Sodium: 139 mmol/L (ref 135–145)

## 2021-03-13 LAB — COOXEMETRY PANEL
Carboxyhemoglobin: 0.5 % (ref 0.5–1.5)
Carboxyhemoglobin: 0.9 % (ref 0.5–1.5)
Methemoglobin: 0.9 % (ref 0.0–1.5)
Methemoglobin: 1 % (ref 0.0–1.5)
O2 Saturation: 28.7 %
O2 Saturation: 40.6 %
Total hemoglobin: 12 g/dL (ref 12.0–16.0)
Total hemoglobin: 12.2 g/dL (ref 12.0–16.0)

## 2021-03-13 LAB — URINALYSIS, COMPLETE (UACMP) WITH MICROSCOPIC
Bacteria, UA: NONE SEEN
Bilirubin Urine: NEGATIVE
Glucose, UA: >=500 mg/dL — AB
Hgb urine dipstick: NEGATIVE
Ketones, ur: NEGATIVE mg/dL
Leukocytes,Ua: NEGATIVE
Nitrite: NEGATIVE
Protein, ur: NEGATIVE mg/dL
Specific Gravity, Urine: >1.03 — ABNORMAL HIGH (ref 1.005–1.030)
pH: 5.5 (ref 5.0–8.0)

## 2021-03-13 LAB — CBC
HCT: 38.2 % (ref 36.0–46.0)
Hemoglobin: 12.2 g/dL (ref 12.0–15.0)
MCH: 30.2 pg (ref 26.0–34.0)
MCHC: 31.9 g/dL (ref 30.0–36.0)
MCV: 94.6 fL (ref 80.0–100.0)
Platelets: 339 10*3/uL (ref 150–400)
RBC: 4.04 MIL/uL (ref 3.87–5.11)
RDW: 14.8 % (ref 11.5–15.5)
WBC: 22.7 10*3/uL — ABNORMAL HIGH (ref 4.0–10.5)
nRBC: 0 % (ref 0.0–0.2)

## 2021-03-13 LAB — ECHOCARDIOGRAM COMPLETE
AR max vel: 0.42 cm2
AV Area VTI: 0.43 cm2
AV Area mean vel: 0.41 cm2
AV Mean grad: 15.8 mmHg
AV Peak grad: 24.6 mmHg
Ao pk vel: 2.48 m/s
Calc EF: 20.2 %
S' Lateral: 4.4 cm
Single Plane A2C EF: 5.6 %
Single Plane A4C EF: 33.9 %

## 2021-03-13 LAB — I-STAT VENOUS BLOOD GAS, ED
Acid-base deficit: 5 mmol/L — ABNORMAL HIGH (ref 0.0–2.0)
Bicarbonate: 20.6 mmol/L (ref 20.0–28.0)
Calcium, Ion: 1.04 mmol/L — ABNORMAL LOW (ref 1.15–1.40)
HCT: 34 % — ABNORMAL LOW (ref 36.0–46.0)
Hemoglobin: 11.6 g/dL — ABNORMAL LOW (ref 12.0–15.0)
O2 Saturation: 84 %
Potassium: 4.2 mmol/L (ref 3.5–5.1)
Sodium: 138 mmol/L (ref 135–145)
TCO2: 22 mmol/L (ref 22–32)
pCO2, Ven: 38.2 mmHg — ABNORMAL LOW (ref 44.0–60.0)
pH, Ven: 7.34 (ref 7.250–7.430)
pO2, Ven: 51 mmHg — ABNORMAL HIGH (ref 32.0–45.0)

## 2021-03-13 LAB — I-STAT ARTERIAL BLOOD GAS, ED
Acid-base deficit: 6 mmol/L — ABNORMAL HIGH (ref 0.0–2.0)
Bicarbonate: 18.4 mmol/L — ABNORMAL LOW (ref 20.0–28.0)
Calcium, Ion: 1.17 mmol/L (ref 1.15–1.40)
HCT: 38 % (ref 36.0–46.0)
Hemoglobin: 12.9 g/dL (ref 12.0–15.0)
O2 Saturation: 94 %
Patient temperature: 98.2
Potassium: 5 mmol/L (ref 3.5–5.1)
Sodium: 134 mmol/L — ABNORMAL LOW (ref 135–145)
TCO2: 19 mmol/L — ABNORMAL LOW (ref 22–32)
pCO2 arterial: 31.4 mmHg — ABNORMAL LOW (ref 32.0–48.0)
pH, Arterial: 7.373 (ref 7.350–7.450)
pO2, Arterial: 70 mmHg — ABNORMAL LOW (ref 83.0–108.0)

## 2021-03-13 LAB — BRAIN NATRIURETIC PEPTIDE: B Natriuretic Peptide: 1091.5 pg/mL — ABNORMAL HIGH (ref 0.0–100.0)

## 2021-03-13 LAB — TROPONIN I (HIGH SENSITIVITY)
Troponin I (High Sensitivity): 5006 ng/L (ref <18)
Troponin I (High Sensitivity): 9898 ng/L (ref <18)
Troponin I (High Sensitivity): 9911 ng/L (ref <18)
Troponin I (High Sensitivity): 9068 ng/L (ref <18)

## 2021-03-13 LAB — CREATININE, URINE, RANDOM: Creatinine, Urine: 98.24 mg/dL

## 2021-03-13 LAB — PROCALCITONIN: Procalcitonin: 0.13 ng/mL

## 2021-03-13 LAB — TSH: TSH: 0.39 u[IU]/mL (ref 0.350–4.500)

## 2021-03-13 LAB — HEPARIN LEVEL (UNFRACTIONATED)
Heparin Unfractionated: 0.6 IU/mL (ref 0.30–0.70)
Heparin Unfractionated: 0.71 IU/mL — ABNORMAL HIGH (ref 0.30–0.70)

## 2021-03-13 LAB — SODIUM, URINE, RANDOM: Sodium, Ur: 22 mmol/L

## 2021-03-13 LAB — C-REACTIVE PROTEIN: CRP: 0.7 mg/dL (ref <1.0)

## 2021-03-13 LAB — GLUCOSE, CAPILLARY: Glucose-Capillary: 122 mg/dL — ABNORMAL HIGH (ref 70–99)

## 2021-03-13 LAB — APTT: aPTT: 140 seconds — ABNORMAL HIGH (ref 24–36)

## 2021-03-13 LAB — PROTIME-INR
INR: 1.7 — ABNORMAL HIGH (ref 0.8–1.2)
Prothrombin Time: 20.2 seconds — ABNORMAL HIGH (ref 11.4–15.2)

## 2021-03-13 LAB — LACTIC ACID, PLASMA
Lactic Acid, Venous: 4.4 mmol/L (ref 0.5–1.9)
Lactic Acid, Venous: 4.1 mmol/L (ref 0.5–1.9)
Lactic Acid, Venous: 3.4 mmol/L (ref 0.5–1.9)

## 2021-03-13 LAB — MAGNESIUM: Magnesium: 1.9 mg/dL (ref 1.7–2.4)

## 2021-03-13 LAB — SEDIMENTATION RATE: Sed Rate: 22 mm/hr (ref 0–22)

## 2021-03-13 SURGERY — RIGHT/LEFT HEART CATH AND CORONARY ANGIOGRAPHY
Anesthesia: LOCAL

## 2021-03-13 MED ORDER — METOPROLOL TARTRATE 25 MG PO TABS
25.0000 mg | ORAL_TABLET | Freq: Four times a day (QID) | ORAL | Status: DC
  Administered 2021-03-13: 25 mg via ORAL
  Filled 2021-03-13: qty 1

## 2021-03-13 MED ORDER — LEVOTHYROXINE SODIUM 50 MCG PO TABS
50.0000 ug | ORAL_TABLET | ORAL | Status: DC
  Administered 2021-03-14 – 2021-03-18 (×3): 50 ug via ORAL
  Filled 2021-03-13 (×4): qty 1

## 2021-03-13 MED ORDER — NOREPINEPHRINE 4 MG/250ML-% IV SOLN
3.0000 ug/min | INTRAVENOUS | Status: DC
  Administered 2021-03-13: 2 ug/min via INTRAVENOUS
  Administered 2021-03-14: 14 ug/min via INTRAVENOUS
  Filled 2021-03-13 (×2): qty 250

## 2021-03-13 MED ORDER — SODIUM CHLORIDE 0.9 % IV SOLN
2.0000 g | INTRAVENOUS | Status: DC
  Administered 2021-03-13 – 2021-03-18 (×6): 2 g via INTRAVENOUS
  Filled 2021-03-13 (×6): qty 2

## 2021-03-13 MED ORDER — VITAMIN D 25 MCG (1000 UNIT) PO TABS
2000.0000 [IU] | ORAL_TABLET | Freq: Every day | ORAL | Status: DC
  Administered 2021-03-14 – 2021-03-18 (×5): 2000 [IU] via ORAL
  Filled 2021-03-13 (×7): qty 2

## 2021-03-13 MED ORDER — VANCOMYCIN HCL 1250 MG/250ML IV SOLN
1250.0000 mg | Freq: Once | INTRAVENOUS | Status: AC
  Administered 2021-03-13: 1250 mg via INTRAVENOUS
  Filled 2021-03-13: qty 250

## 2021-03-13 MED ORDER — FUROSEMIDE 10 MG/ML IJ SOLN
40.0000 mg | Freq: Once | INTRAMUSCULAR | Status: AC
  Administered 2021-03-13: 40 mg via INTRAVENOUS
  Filled 2021-03-13: qty 4

## 2021-03-13 MED ORDER — LEVOTHYROXINE SODIUM 75 MCG PO TABS
75.0000 ug | ORAL_TABLET | ORAL | Status: DC
  Administered 2021-03-13 – 2021-03-17 (×3): 75 ug via ORAL
  Filled 2021-03-13 (×4): qty 1

## 2021-03-13 MED ORDER — METRONIDAZOLE 500 MG/100ML IV SOLN
500.0000 mg | Freq: Two times a day (BID) | INTRAVENOUS | Status: DC
  Administered 2021-03-13: 500 mg via INTRAVENOUS
  Filled 2021-03-13: qty 100

## 2021-03-13 MED ORDER — FUROSEMIDE 10 MG/ML IJ SOLN
7.0000 mg/h | INTRAVENOUS | Status: DC
  Administered 2021-03-13: 10 mg/h via INTRAVENOUS
  Administered 2021-03-14: 8 mg/h via INTRAVENOUS
  Administered 2021-03-15: 13:00:00 10 mg/h via INTRAVENOUS
  Administered 2021-03-16: 7 mg/h via INTRAVENOUS
  Filled 2021-03-13 (×6): qty 20

## 2021-03-13 MED ORDER — VANCOMYCIN HCL IN DEXTROSE 1-5 GM/200ML-% IV SOLN
1000.0000 mg | INTRAVENOUS | Status: DC
  Administered 2021-03-15: 1000 mg via INTRAVENOUS
  Filled 2021-03-13: qty 200

## 2021-03-13 MED ORDER — HYDROXYZINE HCL 10 MG PO TABS
10.0000 mg | ORAL_TABLET | Freq: Once | ORAL | Status: AC
Start: 2021-03-13 — End: 2021-03-13
  Administered 2021-03-13: 10 mg via ORAL
  Filled 2021-03-13: qty 1

## 2021-03-13 MED ORDER — ONDANSETRON HCL 4 MG/2ML IJ SOLN
4.0000 mg | Freq: Four times a day (QID) | INTRAMUSCULAR | Status: DC | PRN
  Administered 2021-03-15 – 2021-03-16 (×2): 4 mg via INTRAVENOUS
  Filled 2021-03-13 (×2): qty 2

## 2021-03-13 MED ORDER — HYDROXYZINE HCL 25 MG PO TABS
25.0000 mg | ORAL_TABLET | Freq: Once | ORAL | Status: AC
  Administered 2021-03-13: 25 mg via ORAL
  Filled 2021-03-13: qty 1

## 2021-03-13 MED ORDER — ORAL CARE MOUTH RINSE
15.0000 mL | Freq: Two times a day (BID) | OROMUCOSAL | Status: DC
  Administered 2021-03-13 – 2021-03-18 (×7): 15 mL via OROMUCOSAL

## 2021-03-13 MED ORDER — ONDANSETRON HCL 4 MG/2ML IJ SOLN
4.0000 mg | Freq: Once | INTRAMUSCULAR | Status: AC
Start: 2021-03-13 — End: 2021-03-13
  Administered 2021-03-13: 4 mg via INTRAVENOUS
  Filled 2021-03-13: qty 2

## 2021-03-13 MED ORDER — FENTANYL CITRATE PF 50 MCG/ML IJ SOSY
25.0000 ug | PREFILLED_SYRINGE | Freq: Once | INTRAMUSCULAR | Status: AC
  Administered 2021-03-13: 25 ug via INTRAVENOUS
  Filled 2021-03-13: qty 1

## 2021-03-13 MED ORDER — SODIUM CHLORIDE 0.9 % IV SOLN: 250.0000 mL | INTRAVENOUS | Status: DC

## 2021-03-13 MED ORDER — LACTATED RINGERS IV BOLUS (SEPSIS)
1000.0000 mL | Freq: Once | INTRAVENOUS | Status: DC
  Administered 2021-03-13: 1000 mL via INTRAVENOUS

## 2021-03-13 MED ORDER — LORAZEPAM 0.5 MG PO TABS
0.2500 mg | ORAL_TABLET | Freq: Once | ORAL | Status: AC
  Administered 2021-03-13: 0.25 mg via ORAL
  Filled 2021-03-13: qty 0.5

## 2021-03-13 MED ORDER — LACTATED RINGERS IV BOLUS
500.0000 mL | Freq: Once | INTRAVENOUS | Status: AC
  Administered 2021-03-13: 500 mL via INTRAVENOUS

## 2021-03-13 MED ORDER — ATORVASTATIN CALCIUM 40 MG PO TABS
40.0000 mg | ORAL_TABLET | Freq: Every day | ORAL | Status: DC
  Administered 2021-03-13 – 2021-03-17 (×5): 40 mg via ORAL
  Filled 2021-03-13 (×5): qty 1

## 2021-03-13 MED ORDER — MILRINONE LACTATE IN DEXTROSE 20-5 MG/100ML-% IV SOLN
0.3750 ug/kg/min | INTRAVENOUS | Status: DC
  Administered 2021-03-13: 0.25 ug/kg/min via INTRAVENOUS
  Administered 2021-03-14 – 2021-03-18 (×7): 0.375 ug/kg/min via INTRAVENOUS
  Filled 2021-03-13 (×5): qty 100
  Filled 2021-03-13: qty 200
  Filled 2021-03-13 (×3): qty 100

## 2021-03-13 MED ORDER — CHLORHEXIDINE GLUCONATE CLOTH 2 % EX PADS
6.0000 | MEDICATED_PAD | Freq: Every day | CUTANEOUS | Status: DC
  Administered 2021-03-13 – 2021-03-18 (×4): 6 via TOPICAL

## 2021-03-13 NOTE — Procedures (Signed)
Central Venous Catheter Insertion Procedure Note  [name]  [id number]  02-Feb-1932  Date:03/07/2021  Time:1:40 PM   Provider Performing:Augustin Bun   Procedure: Insertion of Non-tunneled Central Venous [phone]) with US guidance ([id number])   Indication(s) Medication administration and Difficult access  Consent Risks of the procedure as well as the alternatives and risks of each were explained to the patient and/or caregiver.  Consent for the procedure was obtained and is signed in the bedside chart  Anesthesia Topical only with 1% lidocaine   Timeout Verified patient identification, verified procedure, site/side was marked, verified correct patient position, special equipment/implants available, medications/allergies/relevant history reviewed, required imaging and test results available.  Sterile Technique Maximal sterile technique including full sterile barrier drape, hand hygiene, sterile gown, sterile gloves, mask, hair covering, sterile ultrasound probe cover (if used).  Procedure Description Area of catheter insertion was cleaned with chlorhexidine and draped in sterile fashion.  With real-time ultrasound guidance a central venous catheter was placed into the left subclavian vein. Nonpulsatile blood flow and easy flushing noted in all ports.  The catheter was sutured in place and sterile dressing applied.  Complications/Tolerance None; patient tolerated the procedure well. Chest X-ray is ordered to verify placement for internal jugular or subclavian cannulation.   Chest x-ray is not ordered for femoral cannulation.  Tip in SVC and no pneumothorax seen  EBL Minimal  Specimen(s) None   [name], MD [facility] ICU Physician [place]  Pager: [phone] Or Epic Secure Chat After hours: [phone].  03/01/2021, 1:41 PM

## 2021-03-13 NOTE — Progress Notes (Signed)
° °  Echocardiogram 2D Echocardiogram has been performed.  Beryle Beams 03/27/2021, 9:01 AM

## 2021-03-13 NOTE — Progress Notes (Signed)
Notified bedside nurse of need to draw repeat lactic acid. 

## 2021-03-13 NOTE — Progress Notes (Signed)
Per RN Delana Meyer, pt has a CL and vasopressor is running through one of the lumens.  Jasmine advised vasopressor must run through CL or a u/s guided IV.  RN acknowledges.

## 2021-03-13 NOTE — Progress Notes (Signed)
Elink monitoring code sepsis 

## 2021-03-13 NOTE — ED Notes (Signed)
Provider at bedside, adequate UOP from [place] wants to hold off on inserting foley catheter at this time.

## 2021-03-13 NOTE — Consult Note (Addendum)
NAME:  [name], MRN:  [id number], DOB:  02-Aug-1931, LOS: 1 ADMISSION DATE:  03/04/2021, CONSULTATION DATE:  1/17 REFERRING MD:  Marigene Ehlers, CHIEF COMPLAINT:  circulatory shock    History of Present Illness:  25 yof. Was being evaluated on out-pt setting for possible TAVR for severe AS. As part of this went for dental extraction X 2 on 1/15 which was done under general anesthesia.  When she got home from procedure did have some chest pressure. The following day 1/16 had progressive pressure and associated shortness of breath for which she had some relief w/ SL NTG. Also noted increased orthopnea and occ nausea. In ER had wbc ct 15.1, + trop I, echo w/ septal hypokinesis. Was to be admitted by cardiology. Initial dx NSTEMI felt demand ischemia from surgical procedure and felt also c/b severe symptomatic AS.  Also noted to have lactate of 7.4. Was placed on Oxygen, IV heparin and was awaiting Admission. On 1/17 trop I climbing (admitted [id number]). Lactate clearing down to 4.4, BNP 1091 wbc rising up to 22.7. medical svc consulted for abx and possible sepsis. Started on vanc and cefepime. Was given IVFs. Heart failure later consulted and given progressive hypotension PCCM also consulted. To assist w/care    Pertinent  Medical History  Chronic AF/flutter, HTN, severe Aortic stenosis (was being evaluated by cards for possile TAVR) CKS, hypothyroidism, leimyocarcoma 2003, blindness for macular degeneration  DNR status w/ MOST form  Significant Hospital Events: Including procedures, antibiotic start and stop dates in addition to other pertinent events   1/16 admitted w/ SOB, weakness and orthopnea 1 day after dental extraction. + trop I admitted w/ working dx NSTEMI and acute AS. Also + lactic acidosis  1/17 WBC rising, lactate improved w/ IVFs. Started on vanc and cefepime. CCM consulted for progressive hypotension over course of day. Advanced HF also consulted.   Interim History / Subjective:   Feels sick. Short of breath w. exertion  Objective   Blood pressure 118/85, pulse 85, temperature 98.2 F (36.8 C), temperature source Oral, resp. rate (Abnormal) 28, SpO2 91 %.       No intake or output data in the 24 hours ending 03/10/2021 1253 There were no vitals filed for this visit.  Examination: General: acutely ill appearing 88 year old female. Lying in bed.  HENT: MMM, + JVD sclera not icteric  Lungs: coarse w/ basilar crackles  Cardiovascular: tachy irreg + systolic HM Abdomen: soft  Extremities: warm + LE edema Neuro: awake and oriented  GU: due to void   Resolved Hospital Problem list     Assessment & Plan:  Cardiogenic w/ acute systolic HF, +/- septic shock  -has known severe AS. Now w/ last EF 55-60%. Ddx: New NSTEMI resulting in acute Systolic HF or Given recent dental procedure and known poor dentition ? Sepsis 2/2 aspiration. ? Mix of both Plan CVL placement Ck CVP CK SCVO2 Avoid tachycardia MAP goal > 65 Norepi w/ plan to decide on inotropic support pending SCVO2 Day 1 vanc and cefepime F/u cultures PCT algo  Holding home Cardizem, lisinopril, lopressor and demedex   STEMi w/ Known severe AS Plan Tele Treating shock as above Avoid tachycardia  Asa Heparin  Additional recs per cards  Dyspnea w/ acute pulmonary edema vs aspiration PNA  PCXR w/worsening bilateral airspace disease. Favoring edema  Plan Treat shock Supplemental oxygen  Assess for diuresis  Abx as above   CAF/flutter Plan Rate control  IV heparin  Tele  Acute on chronic renal failure superimposed on CKD stage III Baseline scr 1.4 now 1.89  Plan Optimize CO Serial chems Strict I&O Renal dose meds   Hypothyroidism  Plan Synthroid   H/o Leiomyosarcoma  Plan OP follow up   DNR status -most for reviewed  Best Practice (right click and "Reselect all SmartList Selections" daily)   Diet/type: NPO DVT prophylaxis: systemic heparin GI prophylaxis: N/A Lines:  Central line Foley:  Yes, and it is still needed Code Status:  DNR Last date of multidisciplinary goals of care discussion [per primary ]  Labs   CBC: Recent Labs  Lab 03/20/2021 2029 03/17/2021 0302 03/06/2021 0641  WBC 15.3*  --  22.7*  NEUTROABS 14.3*  --   --   HGB 12.0 11.6* 12.2  HCT 39.5 34.0* 38.2  MCV 96.3  --  94.6  PLT 356  --  536    Basic Metabolic Panel: Recent Labs  Lab 03/15/2021 2029 03/02/2021 0120 03/04/2021 0123 03/03/2021 0302 03/20/2021 0641  NA 135 137  --  138 139  K 4.1 4.2  --  4.2 5.0  CL 100 105  --   --  101  CO2 17* 17*  --   --  21*  GLUCOSE 387* 195*  --   --  152*  BUN 29* 27*  --   --  35*  CREATININE 2.09* 1.74*  --   --  1.89*  CALCIUM 9.5 8.5*  --   --  9.4  MG  --   --  1.9  --   --    GFR: Estimated Creatinine Clearance: 19.5 mL/min (A) (by C-G formula based on SCr of 1.89 mg/dL (H)). Recent Labs  Lab 03/09/2021 2029 03/27/2021 2227 02/25/2021 0122 03/22/2021 0641 03/20/2021 1108  WBC 15.3*  --   --  22.7*  --   LATICACIDVEN  --  7.4* 4.4*  --  4.1*    Liver Function Tests: Recent Labs  Lab 03/10/2021 2227  AST 39  ALT 20  ALKPHOS 73  BILITOT 0.8  PROT 7.4  ALBUMIN 4.0   No results for input(s): LIPASE, AMYLASE in the last 168 hours. No results for input(s): AMMONIA in the last 168 hours.  ABG    Component Value Date/Time   HCO3 20.6 02/26/2021 0302   TCO2 22 03/11/2021 0302   ACIDBASEDEF 5.0 (H) 03/03/2021 0302   O2SAT 84.0 03/06/2021 0302     Coagulation Profile: Recent Labs  Lab 03/02/2021 2227  INR 1.4*    Cardiac Enzymes: No results for input(s): CKTOTAL, CKMB, CKMBINDEX, TROPONINI in the last 168 hours.  HbA1C: Hgb A1c MFr Bld  Date/Time Value Ref Range Status  03/15/2021 10:27 PM 5.9 (H) 4.8 - 5.6 % Final    Comment:    (NOTE) Pre diabetes:          5.7%-6.4%  Diabetes:              >6.4%  Glycemic control for   <7.0% adults with diabetes     CBG: Recent Labs  Lab 03/14/2021 0735  GLUCAP 140*     Review of Systems:   Review of Systems  Constitutional:  Positive for chills and malaise/fatigue.  HENT:  Positive for congestion.   Eyes: Negative.   Respiratory:  Positive for sputum production and shortness of breath.   Cardiovascular:  Positive for chest pain, leg swelling and PND.  Gastrointestinal: Negative.   Genitourinary: Negative.   Musculoskeletal: Negative.   Skin: Negative.  Neurological: Negative.   Endo/Heme/Allergies: Negative.   Psychiatric/Behavioral: Negative.      Past Medical History:  She,  has a past medical history of Atrial fibrillation ([place]), Blood transfusion, Cancer ([place]), Complication of anesthesia, Dysrhythmia, Headache(784.0), Hyperlipidemia, Hypertension, Hypothyroidism, Impaired fasting glucose, Kidney disease, chronic, stage IV (GFR 15-29 ml/min) ([place]), Leiomyosarcoma of uterus ([place]), Macular degeneration, Migraine headache, Mitral regurgitation (11/27/2012), Renal disorder, Shortness of breath, Shortness of breath, UTI (urinary tract infection), Visual loss, right eye, Vitamin D deficiency, and Wrist fracture, right (12/2009).   Surgical History:   Past Surgical History:  Procedure Laterality Date   BLEPHAROPLASTY     Cataract Surgery Bilateral    NERVE BIOPSY     TONSILLECTOMY     TOTAL ABDOMINAL HYSTERECTOMY W/ BILATERAL SALPINGOOPHORECTOMY  01/2012   , Partial resection of the sigmoid colon     Social History:   reports that she has never smoked. She has never used smokeless tobacco. She reports that she does not drink alcohol and does not use drugs.   Family History:  Her family history includes Cancer in her father; Heart disease in her maternal grandfather, maternal grandmother, and mother; Hypertension in her mother and sister; Peripheral Artery Disease in her sister. There is no history of Breast cancer.   Allergies Allergies  Allergen Reactions   Plendil [Felodipine]    Aspirin Other (See Comments)    Stomach upset    Barbiturates Other (See Comments)    Poorly tolerated   Eggs Or Egg-Derived Products Other (See Comments)    diarrhea   Epinephrine Other (See Comments)    Fever    Erythromycin Base Nausea And Vomiting    All mycins   Felodipine Er Other (See Comments)    headache   Morphine And Related Other (See Comments)    Rash    Oxycodone Hcl Other (See Comments)    Heart race   Penicillins Other (See Comments)    Fever    Pravachol Other (See Comments)    Muscle ache   Soy Allergy Other (See Comments)    shaking   Sulfa Antibiotics Other (See Comments)    rash   Wheat Other (See Comments)    Stomach upset   Adhesive [Tape] Other (See Comments)    Rash      Home Medications  Prior to Admission medications   Medication Sig Start Date End Date Taking? Authorizing Provider  acetaminophen (TYLENOL) 500 MG tablet Take 1,000 mg by mouth every 4 (four) hours as needed for moderate pain, headache or mild pain.   Yes [provider]  Cholecalciferol (VITAMIN D) 2000 UNITS tablet Take 2,000 Units by mouth daily.   Yes [provider]  ciprofloxacin (CIPRO) 250 MG tablet Take 250 mg by mouth 2 (two) times daily as needed (for cystitis symptoms). For symptoms    Yes [provider]  diltiazem (CARDIZEM CD) 180 MG 24 hr capsule TAKE 1 CAPSULE IN THE MORNING ON AN EMPTY STOMACH. 01/23/21  Yes Jerline Pain, MD  fluocinonide (LIDEX) 0.05 % external solution 1 application daily as needed (Eczema). 10/16/18  Yes [provider]  levothyroxine (SYNTHROID, LEVOTHROID) 75 MCG tablet Take 75 mcg by mouth every other day. Alternate with 50 mcg with breakfast   Yes [provider]  lisinopril (PRINIVIL,ZESTRIL) 10 MG tablet Take 10 mg by mouth every evening.   Yes [provider]  loratadine (CLARITIN) 10 MG tablet Take 10 mg by mouth daily as needed for allergies.  Yes [provider]  metoprolol succinate (TOPROL-XL) 100 MG 24 hr tablet  TAKE ONE TABLET EVERY DAY WITH OR IMMEDIATELY FOLLOWING A MEAL Patient taking differently: 100 mg every evening. 01/23/21  Yes Jerline Pain, MD  Misc Natural Products (LUTEIN 20) CAPS Take 20 mg by mouth daily.   Yes [provider]  potassium chloride SA (K-DUR,KLOR-CON) 20 MEQ tablet Take 20 mEq by mouth daily.    Yes [provider]  SYNTHROID 50 MCG tablet Take 50 mcg by mouth every other day. Alternate with 75 mcg 02/24/14  Yes [provider]  torsemide (DEMADEX) 20 MG tablet Take 20 mg by mouth every evening.  08/11/13  Yes [provider]  triamcinolone cream (KENALOG) 0.1 % 1 application daily as needed (Eczema). 10/07/18  Yes [provider]  warfarin (COUMADIN) 1 MG tablet TAKE 1- 1.5 TABLETS DAILY AS DIRECTED by coumadin clinic Patient taking differently: Take 1-1.5 mg by mouth See admin instructions. Take 1 mg daily EXCEPT: On Monday take 1.5 mg after supper Or DIRECTED by coumadin clinic 12/25/20  Yes Jerline Pain, MD     Critical care time: 32 min    Erick Colace ACNP-BC [place] Pager # [phone] OR # [phone] if no answer

## 2021-03-13 NOTE — Progress Notes (Signed)
ANTICOAGULATION CONSULT NOTE  Pharmacy Consult for IV heparin Indication: chest pain/ACS  Allergies  Allergen Reactions   Plendil [Felodipine]    Aspirin Other (See Comments)    Stomach upset   Barbiturates Other (See Comments)    Poorly tolerated   Eggs Or Egg-Derived Products Other (See Comments)    diarrhea   Epinephrine Other (See Comments)    Fever    Erythromycin Base Nausea And Vomiting    All mycins   Felodipine Er Other (See Comments)    headache   Morphine And Related Other (See Comments)    Rash    Oxycodone Hcl Other (See Comments)    Heart race   Penicillins Other (See Comments)    Fever    Pravachol Other (See Comments)    Muscle ache   Soy Allergy Other (See Comments)    shaking   Sulfa Antibiotics Other (See Comments)    rash   Wheat Other (See Comments)    Stomach upset   Adhesive [Tape] Other (See Comments)    Rash     Patient Measurements:   Heparin Dosing Weight: 68.5kg  Vital Signs: BP: 120/92 (01/17 1600) Pulse Rate: 87 (01/17 1600)  Labs: Recent Labs    03/01/2021 2029 03/17/2021 2227 03/27/2021 0120 03/15/2021 0123 03/24/2021 0302 03/25/2021 0641 02/26/2021 0642 03/08/2021 1108 03/15/2021 1408 03/04/2021 1425  HGB 12.0  --   --   --  11.6* 12.2  --   --  12.9  --   HCT 39.5  --   --   --  34.0* 38.2  --   --  38.0  --   PLT 356  --   --   --   --  339  --   --   --   --   APTT  --   --   --   --   --   --   --   --   --  140*  LABPROT  --  17.0*  --   --   --   --   --   --   --  20.2*  INR  --  1.4*  --   --   --   --   --   --   --  1.7*  HEPARINUNFRC  --   --   --   --   --   --  0.60  --   --  0.71*  CREATININE 2.09*  --  1.74*  --   --  1.89*  --   --   --   --   TROPONINIHS 616*  --   --    < >  --  1,610*  --  9,911*  --  9,068*   < > = values in this interval not displayed.     Estimated Creatinine Clearance: 19.5 mL/min (A) (by C-G formula based on SCr of 1.89 mg/dL (H)).   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 PMH significant for atrial flutter on chronic anticoagulation with warfarin. Patient was scheduled to have left heart catherization on Wednesday 1/18 and was instructed to stop taking warfarin on Thursday 1/12 in preparation for cath. INR on admission 1.4. Pharmacy to dose IV heparin.   Heparin level drifting up to borderline supratherapeutic at 0.71  Goal of Therapy:  Heparin level 0.3-0.7 units/ml Monitor platelets by anticoagulation protocol: Yes   Plan:  Decrease heparin gtt to 850 units/hr F/u 8 hour heparin level  Bertis Ruddy, PharmD Clinical Pharmacist ED Pharmacist Phone # [phone] 03/15/2021 4:43 PM

## 2021-03-13 NOTE — ED Notes (Signed)
Pt repositioned in bed, belongings collected and placed in belongings bag. Pt given cool washcloth for forehead. Call bell and phone within reach. Pt denies any further needs at this time.

## 2021-03-13 NOTE — Progress Notes (Signed)
eLink Physician-Brief Progress Note Patient Name: [name] DOB: October 25, 1931 MRN: [id number]   Date of Service  03/09/2021  HPI/Events of Note  Patient admitted with NSTEMI in the context of a history of aortic stenosis and cardiomyopathy.  eICU Interventions  New Patient Evaluation.        [name] 03/06/2021, 9:40 PM

## 2021-03-13 NOTE — Progress Notes (Addendum)
Progress Note  Patient Name: [name] Date of Encounter: 03/15/2021  [facility] Cardiologist: Candee Furbish, MD   Subjective   No recurrent chest pain.  Breathing stable.  Inpatient Medications    Scheduled Meds:  aspirin EC  81 mg Oral Daily   cholecalciferol  2,000 Units Oral Daily   [START ON 03/04/2021] levothyroxine  50 mcg Oral QODAY   levothyroxine  75 mcg Oral QODAY   metoprolol tartrate  25 mg Oral Q6H   sodium chloride flush  3 mL Intravenous Q12H   Continuous Infusions:  heparin 950 Units/hr (02/27/2021 0055)   lactated ringers 50 mL/hr at 03/03/2021 0103   PRN Meds:    Vital Signs    Vitals:   02/25/2021 0230 03/20/2021 0536 02/28/2021 0615 03/15/2021 0715  BP: 101/82 (!) 128/113 (!) 105/92 (!) 116/103  Pulse: (!) 110 (!) 114 (!) 101 (!) 101  Resp: 19  12 (!) 23  Temp:      TempSrc:      SpO2: 94%  99% 92%   No intake or output data in the 24 hours ending 02/28/2021 0804 Last 3 Weights 03/02/2021 01/08/2021 01/13/2020  Weight (lbs) 151 lb 155 lb 153 lb  Weight (kg) 68.493 kg 70.308 kg 69.4 kg      Telemetry    Atrial fibrillation at heart rate of 100- Personally Reviewed  ECG    No new tracing  Physical Exam   GEN: Pleasant elderly female in no acute distress.   Neck: No JVD Cardiac: Irregularly irregular, 3/6 systolic murmurs, rubs, or gallops.  Respiratory: Clear to auscultation bilaterally. GI: Soft, nontender, non-distended  MS: No edema; No deformity. Neuro:  Nonfocal  Psych: Normal affect   Labs    High Sensitivity Troponin:   Recent Labs  Lab 03/24/2021 2029 03/11/2021 0123 03/23/2021 0641  TROPONINIHS 616* 5,006* 9,898*     Chemistry Recent Labs  Lab 02/26/2021 2029 03/23/2021 2227 03/08/2021 0120 03/27/2021 0123 03/04/2021 0302 03/15/2021 0641  NA 135  --  137  --  138 139  K 4.1  --  4.2  --  4.2 5.0  CL 100  --  105  --   --  101  CO2 17*  --  17*  --   --  21*  GLUCOSE 387*  --  195*  --   --  152*  BUN 29*  --  27*  --   --   35*  CREATININE 2.09*  --  1.74*  --   --  1.89*  CALCIUM 9.5  --  8.5*  --   --  9.4  MG  --   --   --  1.9  --   --   PROT  --  7.4  --   --   --   --   ALBUMIN  --  4.0  --   --   --   --   AST  --  39  --   --   --   --   ALT  --  20  --   --   --   --   ALKPHOS  --  73  --   --   --   --   BILITOT  --  0.8  --   --   --   --   GFRNONAA 23*  --  28*  --   --  25*  ANIONGAP 18*  --  15  --   --  17*    Lipids  Recent Labs  Lab 03/23/2021 0121  CHOL 200  TRIG 79  HDL 68  LDLCALC 116*  CHOLHDL 2.9    Hematology Recent Labs  Lab 03/04/2021 2029 02/28/2021 0302 02/27/2021 0641  WBC 15.3*  --  22.7*  RBC 4.10  --  4.04  HGB 12.0 11.6* 12.2  HCT 39.5 34.0* 38.2  MCV 96.3  --  94.6  MCH 29.3  --  30.2  MCHC 30.4  --  31.9  RDW 14.7  --  14.8  PLT 356  --  339   Thyroid  Recent Labs  Lab 03/11/2021 2227  TSH 0.390    BNP Recent Labs  Lab 03/23/2021 0123  BNP 1,091.5*    DDimer No results for input(s): DDIMER in the last 168 hours.   Radiology    DG Chest Portable 1 View  Result Date: 03/08/2021 CLINICAL DATA:  Chest pain and shortness of breath. EXAM: PORTABLE CHEST 1 VIEW COMPARISON:  April 10, 2011 FINDINGS: Mild atelectasis is seen within the bilateral lung bases. There is a small left pleural effusion. No pneumothorax is identified. The cardiac silhouette is mildly enlarged. There is marked severity calcification of the thoracic aorta. Moderate to marked severity dextroscoliosis of the thoracic spine is seen with multilevel degenerative changes. IMPRESSION: 1. Mild bibasilar atelectasis with a small left pleural effusion. 2. Mild cardiomegaly. Electronically Signed   By: Virgina Norfolk M.D.   On: 03/25/2021 20:47    Cardiac Studies   Pending echocardiogram and cardiac catheterization  Patient Profile     88 y.o. female severe AS (AVA 0.68, mGrad 32, Vmax 3.9, DI 0.23) who undergoing TAVR work-up A. fib/a flutter (on warfarin), HTN, HLD, CKD (bl sCr ~1.4),  hypothyroidism, and blindness from macular degeneration presented for evaluation of chest pain following dental extraction (2 teeth) under general anesthesia who found to have elevated troponin consistent with non-STEMI.  Assessment & Plan    Non-STEMI -Underwent 2 teeth extraction under "general anesthesia" yesterday morning.  She went home and took a nap.  After waking up she reported severe substernal chest pressure leading to ER evaluation. -High-sensitivity troponin 616>>5006>>9898 -Continue heparin for anticoagulation (warfarin on hold) -Patient is scheduled for right and left cardiac catheterization for undergoing TAVR work-up tomorrow.  Patient currently septic appearing with elevated lactic acid and renal function.  We will continue fluids at 50 cc/h.  Keep N.P.O. but Suspect is already scheduled for tomorrow.  2.  Chronic kidney disease stage IIIb -Baseline renal function around 1.4 -Creatinine improving with gentle hydration 2.09>>1.74>>1.89  3.  Leukocytosis - WBC 14.4>>15.3>>22.7 -Patient denies sick contact or fever or chills -No urinary symptoms but will check UA to rule out any underlying infection.  Questionable septic looking appearance due to recent dental procedure. ? AS.   4.  Aortic stenosis, severe -She has pending left and right cardiac catheterization as scheduled for tomorrow -Overnight fellow has requested updated echocardiogram for today  5.  Persistent atrial fibrillation -Heart rate relatively controlled in 100 -Continue metoprolol tartrate 25 mg every 6 hours -Warfarin on hold for cardiac catheterization -Anticoagulated with heparin  Dr. Gwenlyn Found to see. Keep NPO.  Likely cardiac catheterization tomorrow as scheduled.  For questions or updates, please contact [place] Please consult [email or web address] for contact info under        SignedLeanor Kail, PA  03/17/2021, 8:04 AM     Agree with note by Robbie Lis PA-C  Patient of Dr. Marlou Porch with  critical AS being evaluated by Dr. Ali Lowe  for TAVR.  She had her last 2 teeth removed yesterday under general anesthesia.  She was admitted with chest pain and had positive enzymes consistent with "non-STEMI".  Her last echo performed last month revealed normal LV function.  Her serum creatinine has mildly worsened from a baseline of 1.4.  She is in chronic A. fib rate controlled on beta-blocker as well as Coumadin which was held.  Her INR is 1.4.  She is on IV heparin.  Her white count is up assessors serum lactate consistent with infection.  We will gently hydrate her and get the medicine service to assist with antibiotic therapy.  At this point, she is still on for right left heart cath tomorrow.  Repeat 2D echo is being performed this morning.  Lorretta Harp, M.D., [place], [facility], [place] [address] [place], [place]  [address]  [phone] 03/11/2021 8:40 AM

## 2021-03-13 NOTE — Progress Notes (Signed)
ANTICOAGULATION CONSULT NOTE - Initial Consult  Pharmacy Consult for IV heparin Indication: chest pain/ACS  Allergies  Allergen Reactions   Plendil [Felodipine]    Aspirin Other (See Comments)    Stomach upset   Barbiturates Other (See Comments)    Poorly tolerated   Eggs Or Egg-Derived Products Other (See Comments)    diarrhea   Epinephrine Other (See Comments)    Fever    Erythromycin Base Nausea And Vomiting    All mycins   Felodipine Er Other (See Comments)    headache   Morphine And Related Other (See Comments)    Rash    Oxycodone Hcl Other (See Comments)    Heart race   Penicillins Other (See Comments)    Fever    Pravachol Other (See Comments)    Muscle ache   Soy Allergy Other (See Comments)    shaking   Sulfa Antibiotics Other (See Comments)    rash   Wheat Other (See Comments)    Stomach upset   Adhesive [Tape] Other (See Comments)    Rash     Patient Measurements:   Heparin Dosing Weight: 68.5kg  Vital Signs: Temp: 98.2 F (36.8 C) (01/16 2009) Temp Source: Oral (01/16 2009) BP: 105/92 (01/17 0615) Pulse Rate: 101 (01/17 0615)  Labs: Recent Labs    03/24/2021 2029 03/04/2021 2227 03/09/2021 0120 02/25/2021 0123 03/09/2021 0302 02/28/2021 0641 03/17/2021 0642  HGB 12.0  --   --   --  11.6* 12.2  --   HCT 39.5  --   --   --  34.0* 38.2  --   PLT 356  --   --   --   --  339  --   LABPROT  --  17.0*  --   --   --   --   --   INR  --  1.4*  --   --   --   --   --   HEPARINUNFRC  --   --   --   --   --   --  0.60  CREATININE 2.09*  --  1.74*  --   --   --   --   TROPONINIHS 616*  --   --  5,006*  --   --   --      Estimated Creatinine Clearance: 21.1 mL/min (A) (by C-G formula based on SCr of 1.74 mg/dL (H)).   Medical History: Past Medical History:  Diagnosis Date   Atrial fibrillation ([place])    Blood transfusion    " no reaction to transfusion"   Cancer ([place])    Complication of anesthesia    sensitive to drugs   Dysrhythmia    atrial  flutter   Headache(784.0)    Hyperlipidemia    Hypertension    Hypothyroidism    Impaired fasting glucose    Kidney disease, chronic, stage IV (GFR 15-29 ml/min) (HCC)    Leiomyosarcoma of uterus (HCC)    Chemotherapy, Magrinat and Clarke-Pearson   Macular degeneration    Migraine headache    Improved after menopause   Mitral regurgitation 11/27/2012   Renal disorder    Shortness of breath    Shortness of breath    Cardiology Eval., possible diastolic dysfunctio   UTI (urinary tract infection)    Frequent   Visual loss, right eye    Left ocular accident   Vitamin D deficiency    Wrist fracture, right 12/2009   Assessment: [name] presenting  to ED for chest pain. PMH significant for atrial flutter on chronic anticoagulation with warfarin. Patient was scheduled to have left heart catherization on Wednesday 1/18 and was instructed to stop taking warfarin on Thursday 1/12 in preparation for cath. INR on admission 1.4. Pharmacy to dose IV heparin.   Heparin level 0.6 (on 950 units/hr) No signs/symptoms of bleed  Goal of Therapy:  Heparin level 0.3-0.7 units/ml Monitor platelets by anticoagulation protocol: Yes   Plan:  Continue heparin drip at 950 units/hr  F/u LHC to determine next heparin level Daily heparin level and CBC ordered Monitor for signs/symptoms of bleed Potential plan to restart warfarin post-cath  Thank you for allowing pharmacy to be a part of this patients care.  Donnald Garre, PharmD Clinical Pharmacist  Please check AMION for all [place] numbers After 10:00 PM, call [place] [phone]

## 2021-03-13 NOTE — Progress Notes (Signed)
° °  Reassessed  this afternoon with Dr Aundra Dubin.   CCM appreciated. Central line palced. CO-OX 29%. C/W Cardiogenic shock.   Started on norepi 2 mcg + milrinone 0.125 mcg + lasix drip.   SBP improved. Check CO-OX at 1800. If CO-OX < 55%. Can increase milrinone 0.25 mcg.   Currently no beds in ICU.   Kristi Hyer NP-C  4:54 PM

## 2021-03-13 NOTE — Progress Notes (Signed)
Pharmacy Antibiotic Note  [name] is a 88 y.o. female admitted on 03/04/2021 with sepsis of unknown source, considering UTI. Pharmacy has been consulted for Vancomycin/cefepime dosing.  Scr 1.89  Plan: Vancomycin 1250mg  LD x1 followed by 1000mg  q48hr eAUC 539 Cefepime 2gm q24hr Plan to obtain levels at steady state Will monitor for acute changes in renal function and adjust as needed F/u cultures results and de-escalate as appropriate    Temp (24hrs), Avg:98.2 F (36.8 C), Min:98.2 F (36.8 C), Max:98.2 F (36.8 C)  Recent Labs  Lab 03/15/2021 2029 03/23/2021 2227 03/07/2021 0120 03/17/2021 0122 03/15/2021 0641  WBC 15.3*  --   --   --  22.7*  CREATININE 2.09*  --  1.74*  --  1.89*  LATICACIDVEN  --  7.4*  --  4.4*  --     Estimated Creatinine Clearance: 19.5 mL/min (A) (by C-G formula based on SCr of 1.89 mg/dL (H)).    Allergies  Allergen Reactions   Plendil [Felodipine]    Aspirin Other (See Comments)    Stomach upset   Barbiturates Other (See Comments)    Poorly tolerated   Eggs Or Egg-Derived Products Other (See Comments)    diarrhea   Epinephrine Other (See Comments)    Fever    Erythromycin Base Nausea And Vomiting    All mycins   Felodipine Er Other (See Comments)    headache   Morphine And Related Other (See Comments)    Rash    Oxycodone Hcl Other (See Comments)    Heart race   Penicillins Other (See Comments)    Fever    Pravachol Other (See Comments)    Muscle ache   Soy Allergy Other (See Comments)    shaking   Sulfa Antibiotics Other (See Comments)    rash   Wheat Other (See Comments)    Stomach upset   Adhesive [Tape] Other (See Comments)    Rash     Antimicrobials this admission: Vancomycin 1/17 >>  Metronidazole 1/17>> Cefepime 1/17 >>   Dose adjustments this admission: None  Microbiology results: 1/17 BCx: IP 1/17 UCx: IP    Thank you for allowing pharmacy to be a part of this patients care.  [name] E  [name] 03/26/2021 10:00 AM  .rx

## 2021-03-13 NOTE — ED Notes (Signed)
Lunch Ordered °

## 2021-03-13 NOTE — Progress Notes (Signed)
Notified bedside nurse of need to draw lactic acid and blood cultures so fluids and antibiotics can be given.

## 2021-03-13 NOTE — Progress Notes (Signed)
Requested to consult for SIRS/sepsis.  Patient initially presented with complaints of chest pain after having recent dental extraction and was found to have sensitivity troponins elevated up to 9898. SIRS: Patient was noted to be tachycardic and tachypneic with WBC 15.3-> 22.7 and lactic acid 7.4->4.4.  She has been afebrile.  Chest x-ray noted bibasilar atelectasis with small left-sided pleural effusion and mild cardiomegaly.  Unclear if symptoms are reactive in nature given presenting issue and/or recent dental extractions versus possibility of underlying infection.  Orders have been placed to check blood cultures, urinalysis, ESR, CRP, and procalcitonin.  Patient was ordered bolus of 1.5 L of IV fluid to start on vancomycin, metronidazole, and cefepime for unknown source.  However, patient was noted to be severely ill requiring ICU admission for which PCCM was consulted.  TRH available, if needed.

## 2021-03-13 NOTE — Consult Note (Addendum)
Advanced Heart Failure Team Consult Note   Primary Physician: Lavone Orn, MD PCP-Cardiologist:  Candee Furbish, MD  Reason for Consultation: Heart Failure   HPI:    [name] is seen today for evaluation of heart failure at the request of Dr Gwenlyn Found.  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  Also being worked up for TAVR.   Echo 01/2021 60-65% Echo 02/12/21 Compared to prior TTE in 01/2019, moderate-to-severe aortic stenosis.  Saw Dr Ali Lowe on 03/02/21 in the office for possible TAVR. She was set up for cath.   Presented  to ED for evaluation of chest pain following dental extraction (2 teeth) under general anesthesia earlier that day. HS Trop elevated with  [phone]. Started on heparin drip. WBC 14>15>22K. Lactic acid >7.>4.4.  CXR with small left pleural effusion. Echo EF 20-25% RV severely reduced and possible Takotsubo.Blood cultures obtained today. Started on empiric antibiotics and given fluid resuscitation.   SOB at rest.   Review of Systems: [y] = yes, [ ]  = no   General: Weight gain [ ] ; Weight loss [ ] ; Anorexia [ ] ; Fatigue [ ] ; Fever [ ] ; Chills [ ] ; Weakness [ Y]  Cardiac: Chest pain/pressure [ ] ; Resting SOB [ ] ; Exertional SOB [Y ]; Orthopnea [ Y]; Pedal Edema [ ] ; Palpitations [ ] ; Syncope [ ] ; Presyncope [ ] ; Paroxysmal nocturnal dyspnea[ ]   Pulmonary: Cough [ ] ; Wheezing[ ] ; Hemoptysis[ ] ; Sputum [ ] ; Snoring [ ]   GI: Vomiting[ ] ; Dysphagia[ ] ; Melena[ ] ; Hematochezia [ ] ; Heartburn[ ] ; Abdominal pain [ ] ; Constipation [ ] ; Diarrhea [ ] ; BRBPR [ ]   GU: Hematuria[ ] ; Dysuria [ ] ; Nocturia[ ]   Vascular: Pain in legs with walking [ ] ; Pain in feet with lying flat [ ] ; Non-healing sores [ ] ; Stroke [ ] ; TIA [ ] ; Slurred speech [ ] ;  Neuro: Headaches[ ] ; Vertigo[ ] ; Seizures[ ] ; Paresthesias[ ] ;Blurred vision [ ] ; Diplopia [ ] ; Vision  changes [ ]   Ortho/Skin: Arthritis [ ] ; Joint pain [ Y]; Muscle pain [ ] ; Joint swelling [ ] ; Back Pain [ ] ; Rash [ ]   Psych: Depression[ ] ; Anxiety[ ]   Heme: Bleeding problems [ ] ; Clotting disorders [ ] ; Anemia [ ]   Endocrine: Diabetes [ ] ; Thyroid dysfunction[ Y]  Home Medications Prior to Admission medications   Medication Sig Start Date End Date Taking? Authorizing Provider  acetaminophen (TYLENOL) 500 MG tablet Take 1,000 mg by mouth every 4 (four) hours as needed for moderate pain, headache or mild pain.   Yes [provider]  Cholecalciferol (VITAMIN D) 2000 UNITS tablet Take 2,000 Units by mouth daily.   Yes [provider]  ciprofloxacin (CIPRO) 250 MG tablet Take 250 mg by mouth 2 (two) times daily as needed (for cystitis symptoms). For symptoms    Yes [provider]  diltiazem (CARDIZEM CD) 180 MG 24 hr capsule TAKE 1 CAPSULE IN THE MORNING ON AN EMPTY STOMACH. 01/23/21  Yes Jerline Pain, MD  fluocinonide (LIDEX) 0.05 % external solution 1 application daily as needed (Eczema). 10/16/18  Yes [provider]  levothyroxine (SYNTHROID, LEVOTHROID) 75 MCG tablet Take 75 mcg by mouth every other day. Alternate with 50 mcg with breakfast   Yes [provider]  lisinopril (PRINIVIL,ZESTRIL) 10 MG tablet Take 10 mg by mouth every evening.   Yes [provider]  loratadine (CLARITIN) 10 MG tablet Take 10 mg by mouth daily as needed for allergies.    Yes [provider]  metoprolol succinate (TOPROL-XL) 100 MG 24 hr tablet TAKE ONE TABLET EVERY DAY WITH OR IMMEDIATELY FOLLOWING A MEAL Patient taking differently: 100 mg every evening. 01/23/21  Yes Jerline Pain, MD  Misc Natural Products (LUTEIN 20) CAPS Take 20 mg by mouth daily.   Yes [provider]  potassium chloride SA (K-DUR,KLOR-CON) 20 MEQ tablet Take 20 mEq by mouth daily.    Yes [provider]  SYNTHROID 50 MCG tablet Take 50 mcg by mouth every  other day. Alternate with 75 mcg 02/24/14  Yes [provider]  torsemide (DEMADEX) 20 MG tablet Take 20 mg by mouth every evening.  08/11/13  Yes [provider]  triamcinolone cream (KENALOG) 0.1 % 1 application daily as needed (Eczema). 10/07/18  Yes [provider]  warfarin (COUMADIN) 1 MG tablet TAKE 1- 1.5 TABLETS DAILY AS DIRECTED by coumadin clinic Patient taking differently: Take 1-1.5 mg by mouth See admin instructions. Take 1 mg daily EXCEPT: On Monday take 1.5 mg after supper Or DIRECTED by coumadin clinic 12/25/20  Yes Jerline Pain, MD    Past Medical History: Past Medical History:  Diagnosis Date   Atrial fibrillation ([place])    Blood transfusion    " no reaction to transfusion"   Cancer ([place])    Complication of anesthesia    sensitive to drugs   Dysrhythmia    atrial flutter   Headache(784.0)    Hyperlipidemia    Hypertension    Hypothyroidism    Impaired fasting glucose    Kidney disease, chronic, stage IV (GFR 15-29 ml/min) (HCC)    Leiomyosarcoma of uterus (HCC)    Chemotherapy, Magrinat and Clarke-Pearson   Macular degeneration    Migraine headache    Improved after menopause   Mitral regurgitation 11/27/2012   Renal disorder    Shortness of breath    Shortness of breath    Cardiology Eval., possible diastolic dysfunctio   UTI (urinary tract infection)    Frequent   Visual loss, right eye    Left ocular accident   Vitamin D deficiency    Wrist fracture, right 12/2009    Past Surgical History: Past Surgical History:  Procedure Laterality Date   BLEPHAROPLASTY     Cataract Surgery Bilateral    NERVE BIOPSY     TONSILLECTOMY     TOTAL ABDOMINAL HYSTERECTOMY W/ BILATERAL SALPINGOOPHORECTOMY  01/2012   , Partial resection of the sigmoid colon    Family History: Family History  Problem Relation Age of Onset   Heart disease Mother    Hypertension Mother    Cancer Father    Peripheral Artery Disease Sister     Hypertension Sister    Heart disease Maternal Grandmother    Heart disease Maternal Grandfather    Breast cancer Neg Hx     Social History: Social History   Socioeconomic History   Marital status: Single    Spouse name: Not on file   Number of children: Not on file   Years of education: Not on file   Highest education level: Not on file  Occupational History   Not on file  Tobacco Use   Smoking status: Never   Smokeless tobacco: Never  Vaping Use   Vaping Use: Never used  Substance and Sexual Activity   Alcohol use: No   Drug use: No  Sexual activity: Never    Birth control/protection: Post-menopausal  Other Topics Concern   Not on file  Social History Narrative   Not on file   Social Determinants of Health   Financial Resource Strain: Not on file  Food Insecurity: Not on file  Transportation Needs: Not on file  Physical Activity: Not on file  Stress: Not on file  Social Connections: Not on file    Allergies:  Allergies  Allergen Reactions   Plendil [Felodipine]    Aspirin Other (See Comments)    Stomach upset   Barbiturates Other (See Comments)    Poorly tolerated   Eggs Or Egg-Derived Products Other (See Comments)    diarrhea   Epinephrine Other (See Comments)    Fever    Erythromycin Base Nausea And Vomiting    All mycins   Felodipine Er Other (See Comments)    headache   Morphine And Related Other (See Comments)    Rash    Oxycodone Hcl Other (See Comments)    Heart race   Penicillins Other (See Comments)    Fever    Pravachol Other (See Comments)    Muscle ache   Soy Allergy Other (See Comments)    shaking   Sulfa Antibiotics Other (See Comments)    rash   Wheat Other (See Comments)    Stomach upset   Adhesive [Tape] Other (See Comments)    Rash     Objective:    Vital Signs:   Temp:  [98.2 F (36.8 C)] 98.2 F (36.8 C) (01/16 2009) Pulse Rate:  [50-115] 85 (01/17 1200) Resp:  [12-32] 28 (01/17 1200) BP: (101-148)/(67-113)  118/85 (01/17 1200) SpO2:  [91 %-99 %] 91 % (01/17 1200)    Weight change: There were no vitals filed for this visit.  Intake/Output:  No intake or output data in the 24 hours ending 03/24/2021 1207    Physical Exam    General: No resp difficulty HEENT: normal Neck: supple. JVP to jaw . Carotids 2+ bilat; no bruits. No lymphadenopathy or thyromegaly appreciated. Cor: PMI nondisplaced. Irregular rate & rhythm. No rubs, gallops or murmurs. Lungs: clear Abdomen: soft, nontender, nondistended. No hepatosplenomegaly. No bruits or masses. Good bowel sounds. Extremities: no cyanosis, clubbing, rash, edema Neuro: alert & orientedx3, cranial nerves grossly intact. moves all 4 extremities w/o difficulty. Affect pleasant   Telemetry   A fib 100s  EKG     Afib   Labs   Basic Metabolic Panel: Recent Labs  Lab 03/02/2021 2029 03/06/2021 0120 02/28/2021 0123 03/11/2021 0302 03/11/2021 0641  NA 135 137  --  138 139  K 4.1 4.2  --  4.2 5.0  CL 100 105  --   --  101  CO2 17* 17*  --   --  21*  GLUCOSE 387* 195*  --   --  152*  BUN 29* 27*  --   --  35*  CREATININE 2.09* 1.74*  --   --  1.89*  CALCIUM 9.5 8.5*  --   --  9.4  MG  --   --  1.9  --   --     Liver Function Tests: Recent Labs  Lab 03/27/2021 2227  AST 39  ALT 20  ALKPHOS 73  BILITOT 0.8  PROT 7.4  ALBUMIN 4.0   No results for input(s): LIPASE, AMYLASE in the last 168 hours. No results for input(s): AMMONIA in the last 168 hours.  CBC: Recent Labs  Lab 03/11/2021 2029  03/09/2021 0302 03/02/2021 0641  WBC 15.3*  --  22.7*  NEUTROABS 14.3*  --   --   HGB 12.0 11.6* 12.2  HCT 39.5 34.0* 38.2  MCV 96.3  --  94.6  PLT 356  --  339    Cardiac Enzymes: No results for input(s): CKTOTAL, CKMB, CKMBINDEX, TROPONINI in the last 168 hours.  BNP: BNP (last 3 results) Recent Labs    03/22/2021 0123  BNP 1,091.5*    ProBNP (last 3 results) No results for input(s): PROBNP in the last 8760 hours.   CBG: Recent Labs   Lab 02/26/2021 0735  GLUCAP 140*    Coagulation Studies: Recent Labs    03/07/2021 05/24/25  LABPROT 17.0*  INR 1.4*     Imaging   DG Chest Portable 1 View  Result Date: 03/19/2021 CLINICAL DATA:  Chest pain and shortness of breath. EXAM: PORTABLE CHEST 1 VIEW COMPARISON:  April 10, 2011 FINDINGS: Mild atelectasis is seen within the bilateral lung bases. There is a small left pleural effusion. No pneumothorax is identified. The cardiac silhouette is mildly enlarged. There is marked severity calcification of the thoracic aorta. Moderate to marked severity dextroscoliosis of the thoracic spine is seen with multilevel degenerative changes. IMPRESSION: 1. Mild bibasilar atelectasis with a small left pleural effusion. 2. Mild cardiomegaly. Electronically Signed   By: Virgina Norfolk M.D.   On: 03/05/2021 20:47   ECHOCARDIOGRAM COMPLETE  Result Date: 03/26/2021    ECHOCARDIOGRAM REPORT   Patient Name:   [name] Date of Exam: 03/20/2021 Medical Rec #:  [id number]        Height:       63.0 in Accession #:    [id number]       Weight:       151.0 lb Date of Birth:  26-Jul-1931        BSA:          1.716 m Patient Age:    8 years         BP:           116/103 mmHg Patient Gender: F                HR:           108 bpm. Exam Location:  Inpatient Procedure: 2D Echo, Cardiac Doppler and Color Doppler Indications:    NSTEMI  History:        Patient has prior history of Echocardiogram examinations, most                 recent 02/12/2021. Aortic Valve Disease and Mitral Valve                 Disease, Arrythmias:Atrial Fibrillation; Risk                 Factors:Hypertension. HLD/ MR/ DYSRHYTHMIA.  Sonographer:    Beryle Beams Referring Phys: [id number] [place]  1. Severely reduced LV function, EF 20-25%. WMA concerning for LAD infarction vs stress induced cardiomyopathy. LVOT VTI 7.0 cm which equates to CO 1.8 L/min and 1.0 L/min/m2. Left ventricular ejection fraction, by estimation, is  20 to 25%. The left ventricle has severely decreased function. The left ventricle demonstrates regional wall motion abnormalities (see scoring diagram/findings for description). The left ventricular internal cavity size was mildly dilated. Left ventricular diastolic function could not be evaluated.  2. Severe low flow low gradient aortic stenosis is present. AS is likely critical. Vmax 2.5 m/s, MG 16  mmHG, AVA 0.43 cm2, DI 0.19. SV index 10cc/m2. The aortic valve is calcified. Aortic valve regurgitation is not visualized. Severe aortic valve stenosis.  3. Right ventricular systolic function is severely reduced. The right ventricular size is moderately enlarged. There is moderately elevated pulmonary artery systolic pressure. The estimated right ventricular systolic pressure is 16.9 mmHg.  4. Left atrial size was severely dilated.  5. Right atrial size was severely dilated.  6. The mitral valve is degenerative. Moderate to severe mitral valve regurgitation.  7. Tricuspid valve regurgitation is moderate.  8. The inferior vena cava is dilated in size with <50% respiratory variability, suggesting right atrial pressure of 15 mmHg. Comparison(s): Changes from prior study are noted. EF is now reduced with WMA. Concerns for low output. Critical AS. FINDINGS  Left Ventricle: Severely reduced LV function, EF 20-25%. WMA concerning for LAD infarction vs stress induced cardiomyopathy. LVOT VTI 7.0 cm which equates to CO 1.8 L/min and 1.0 L/min/m2. Left ventricular ejection fraction, by estimation, is 20 to 25%.  The left ventricle has severely decreased function. The left ventricle demonstrates regional wall motion abnormalities. The left ventricular internal cavity size was mildly dilated. There is no left ventricular hypertrophy. Left ventricular diastolic function could not be evaluated due to atrial fibrillation. Left ventricular diastolic function could not be evaluated.  LV Wall Scoring: The mid and distal anterior  septum, mid inferoseptal segment, apical anterior segment, apical inferior segment, and apex are akinetic. Right Ventricle: The right ventricular size is moderately enlarged. No increase in right ventricular wall thickness. Right ventricular systolic function is severely reduced. There is moderately elevated pulmonary artery systolic pressure. The tricuspid regurgitant velocity is 2.80 m/s, and with an assumed right atrial pressure of 15 mmHg, the estimated right ventricular systolic pressure is 67.8 mmHg. Left Atrium: Left atrial size was severely dilated. Right Atrium: Right atrial size was severely dilated. Pericardium: Trivial pericardial effusion is present. Mitral Valve: The mitral valve is degenerative in appearance. Mild to moderate mitral annular calcification. Moderate to severe mitral valve regurgitation. MV peak gradient, 49.3 mmHg. Tricuspid Valve: The tricuspid valve is grossly normal. Tricuspid valve regurgitation is moderate . No evidence of tricuspid stenosis. Aortic Valve: Severe low flow low gradient aortic stenosis is present. AS is likely critical. Vmax 2.5 m/s, MG 16 mmHG, AVA 0.43 cm2, DI 0.19. SV index 10cc/m2. The aortic valve is calcified. Aortic valve regurgitation is not visualized. Severe aortic stenosis is present. Aortic valve mean gradient measures 15.8 mmHg. Aortic valve peak gradient measures 24.6 mmHg. Aortic valve area, by VTI measures 0.43 cm. Pulmonic Valve: The pulmonic valve was grossly normal. Pulmonic valve regurgitation is not visualized. No evidence of pulmonic stenosis. Aorta: The aortic root is normal in size and structure. Venous: The inferior vena cava is dilated in size with less than 50% respiratory variability, suggesting right atrial pressure of 15 mmHg. IAS/Shunts: The atrial septum is grossly normal.  LEFT VENTRICLE PLAX 2D LVIDd:         4.59 cm     Diastology LVIDs:         4.40 cm     LV e' lateral: 10.70 cm/s LV PW:         1.50 cm LV IVS:        0.95 cm  LVOT diam:     1.70 cm LV SV:         17 LV SV Index:   10 LVOT Area:     2.27 cm  LV Volumes (  MOD) LV vol d, MOD A2C: 89.1 ml LV vol d, MOD A4C: 82.0 ml LV vol s, MOD A2C: 84.1 ml LV vol s, MOD A4C: 54.2 ml LV SV MOD A2C:     5.0 ml LV SV MOD A4C:     82.0 ml LV SV MOD BP:      17.7 ml RIGHT VENTRICLE RV S prime:     6.64 cm/s TAPSE (M-mode): 1.3 cm LEFT ATRIUM              Index        RIGHT ATRIUM           Index LA diam:        5.10 cm  2.97 cm/m   RA Area:     25.00 cm 14.57 cm/m LA Vol (A2C):   105.0 ml 61.19 ml/m LA Vol (A4C):   105.0 ml 61.19 ml/m LA Biplane Vol: 105.0 ml 61.19 ml/m  AORTIC VALVE                     PULMONIC VALVE AV Area (Vmax):    0.42 cm      PV Vmax:       0.45 m/s AV Area (Vmean):   0.41 cm      PV Peak grad:  0.8 mmHg AV Area (VTI):     0.43 cm AV Vmax:           248.05 cm/s AV Vmean:          189.094 cm/s AV VTI:            0.390 m AV Peak Grad:      24.6 mmHg AV Mean Grad:      15.8 mmHg LVOT Vmax:         45.57 cm/s LVOT Vmean:        33.779 cm/s LVOT VTI:          0.074 m LVOT/AV VTI ratio: 0.19  AORTA Ao Root diam: 2.80 cm Ao Asc diam:  2.50 cm MITRAL VALVE             TRICUSPID VALVE MV Peak grad: 49.3 mmHg  TR Peak grad:   31.4 mmHg MV Vmax:      3.51 m/s   TR Vmax:        280.00 cm/s MV Vmean:     353.0 cm/s                          SHUNTS                          Systemic VTI:  0.07 m                          Systemic Diam: 1.70 cm Eleonore Chiquito MD Electronically signed by Eleonore Chiquito MD Signature Date/Time: 03/24/2021/10:16:21 AM    Final      Medications:     Current Medications:  aspirin EC  81 mg Oral Daily   cholecalciferol  2,000 Units Oral Daily   [START ON 02/26/2021] levothyroxine  50 mcg Oral QODAY   levothyroxine  75 mcg Oral QODAY   sodium chloride flush  3 mL Intravenous Q12H    Infusions:  ceFEPime (MAXIPIME) IV Stopped (02/27/2021 1205)   heparin 950 Units/hr (02/27/2021 1115)   metronidazole 500 mg (03/20/2021 1128)   [START ON 03/15/2021]  vancomycin     vancomycin        Patient Profile    [name] is a 88 year old with a history of  severe AS, A fib/Aflutter, HTN, HLD, CKD, hypothyroidism, leimyosarcoma 2003, and 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  Also being worked up for TAVR.   Presented to chest pain. NSTEMI. WBC 22K. Septic shock .   Assessment/Plan   NSTEMI -HS Trop [phone]. Check another HS Trop -Plan for cath once recovered.  -On heparin drip  - Add atorvastatin and aspirin.   2. Shock-->Sepsis +/- cardiogenic  -? Had 2 teeth removed  03/07/2021  -Hypotensive. Lactic acid 7.4>4.4>pending  -WBC 14>15>22K  -Blood cultures obtained today  -Placed on empiric antibiotics - Stop fluid resuscitation. On exam marked volume overload. .   3. Acute HFrEF--> possible cardiogenic shock  -Echo reviewed today by Dr Aundra Dubin. EF dramatically different with EF down 25%.  RV moderately reduced. Possible Takotsubo. IVC dilated.  -Hold BB -Will need to add milrinone 0.25 mcg + norepi. 2 mcg - Volume overloaded.  Stop IV fluids. Start lasix drip at 10 mg per hour.  - Will need central access.   3. Aortic Stenosis  -Being worked up for TAVR.   4.CKD Stage  Creatinine baseline 1.5-1.7 Admit creatinine 1.9 Follow daily BMET   5. Afib , chronic - Rate ok but with addition pressors may need amio drip.  -On heparin drip.    6.Blindness, macular degeneration   7. Leiomyosarcoma  Completed treatment 2004.   CCM consulted for central access. Will need ICU.   Length of Stay: 1  Amy Clegg, NP  03/02/2021, 12:07 PM  Advanced Heart Failure Team Pager [phone] (M-F; 7a - 5p)  Please contact [facility] for night-coverage after hours (4p -7a ) and weekends on [email or web address]  Patient seen with NP, agree with the above note.  Complicated history as outlined above.  Patient had normal LV and RV function on 12/22 echo but had severe aortic stenosis.  She saw Dr. Ali Lowe with plan for RHC/LHC tomorrow as part of  TAVR workup.  She has had a dental infection and had 2 infected teeth removed yesterday. After this, she developed chest pain.  She came to the ER and troponin was checked, peaking at 9911.  However, lactate was also elevated to 7.4 with leukocytosis. She was in atrial fibrillation with PVCs (chronic AF), no STEMI. Creatinine was up to 2.09 initially.  She was given IV fluid, plan for cath today.  However, patient has become progressively short of breath.  SBP 90s-100s.  Lactate trending down but still elevated today at 4.1. She has received broad spetrum antibiotics due to concern for possible septic shock from odontogenic source.  Currently, no chest pain, just short of breath.   I reviewed her echo.  Marked change from prior with biventricular failure peri-apical akinesis of both the LV and RV, relative basal preservation.  Severe aortic stenosis, moderate-severe mitral regurgitation, dilated IVC.   General: Orthopneic.  Neck: JVP 14-16 cm, no thyromegaly or thyroid nodule.  Lungs: Crackles at bases.  CV: Nondisplaced PMI.  Heart irregular S1/S2, no S3/S4, 3/6 SEM RUSB.  1+ ankle edema.  No carotid bruit.  Difficult to palpate pedal pulses.  Abdomen: Soft, nontender, no hepatosplenomegaly, no distention.  Skin: Intact without lesions or rashes.  Neurologic: Alert and oriented x 3.  Psych: Normal affect. Extremities: No clubbing or cyanosis. Cool extremities.  HEENT: Normal.   1. Shock: Concern for septic + cardiogenic  etiology.  Occurred after extraction of infected teeth yesterday, ?odontogenic seeding of bloodstream.  Echo with LV EF 20-25% and moderate RVE/severe RV dysfunction; marked change from prior with biventricular failure and peri-apical akinesis of both the LV and RV, relative basal preservation; severe aortic stenosis, moderate-severe mitral regurgitation, dilated IVC. Biventricular failure could be a stress (Takotsubo-type) cardiomyopathy in setting of sepsis versus large LAD MI (but  no ST elevation and while HS-TnI is elevated, it is not as high as one might expect with a large occluded LAD). Lactate initially 7.4, now trending down to 4.1. SBP 90s-100s currently with volume overload on exam.  - Placing CVL to follow CVP and co-ox.  - Stop IV fluid, she is significantly volume overloaded.  - Start low dose NE for now at 2 then milrinone 0.25 for inotropic support.  - Trend lactate.  - Vancomycin/cefepime started empirically.  - If needs mechanical support, can get IABP.  2. Acute systolic CHF: Echo this admission with LV EF 20-25% and moderate RVE/severe RV dysfunction; marked change from prior with biventricular failure and peri-apical akinesis of both the LV and RV, relative basal preservation; severe aortic stenosis, moderate-severe mitral regurgitation, dilated IVC. Marked change from 12/22.  As above, biventricular failure could be a stress (Takotsubo-type) cardiomyopathy in setting of sepsis versus large LAD MI (but no ST elevation and while HS-TnI is elevated, it is not as high as one might expect with a large occluded LAD). Currently volume overloaded with orthopnea and significant oxygen requirement.  - As above, adding milrinone 0.25 + low dose NE.  - Needs diuresis or may end up intubated => Lasix 40 mg IV x 1 then Lasix gtt 10 mg/hr.  Follow closely, will need foley.  - Follow CVP and co-ox.  - Needs eventual cath (LHC/RHC) to sort out Takotsubo/stress CMP versus ischemic CMP.  No STEMI on ECG and no further chest pain, so would like to wait until she is more stable and creatinine has settled down before taking to cath lab. 3. CAD: NSTEMI with HS-TnI  [phone].  Troponin seems high for stress cardiomyopathy but somewhat low for acute LAD occlusion (and no STE).  Could additionally be demand ischemia with underlying diffuse CAD and hypotensive event.  No chest pain since she initially came to the ER yesterday (just dyspnea).  Therefore, think reasonable to hold  off on LHC/RHC until she is more stable.  - Heparin gtt.  - ASA, statin.  - Plan LHC/RHC when more stable and creatinine trending down.  4. Aortic stenosis: Severe low flow/low gradient aortic stenosis.  Has started TAVR workup.  5. AKI on CKD stage 3: Baseline creatinine around 1.5.  2.09 at admission, 1.89 now.  - Support cardiac output and follow closely.  6. Mitral regurgitation: Moderate to severe on echo, likely functional.  May improve if she gets to TAVR in the future.  7. Atrial fibrillation: Chronic.  Rate in 90s for now.  - Stop metoprolol.  - On heparin gtt.  - Can start amiodarone gtt for rate control if needed.  8. Legally blind 9. ID: WBCs 23 but afebrile.  Concern for septic shock picture. ?Odontogenic seeding from dental extractions.  - Blood and urine cultures.  - vancomycin/cefepime.  - Send PCT.   Loralie Champagne 03/24/2021 2:16 PM

## 2021-03-14 ENCOUNTER — Ambulatory Visit (HOSPITAL_COMMUNITY): Admission: RE | Admit: 2021-03-14 | Payer: Medicare Other | Source: Home / Self Care | Admitting: Internal Medicine

## 2021-03-14 ENCOUNTER — Encounter (HOSPITAL_COMMUNITY): Admission: EM | Disposition: E | Payer: Self-pay | Source: Home / Self Care | Attending: Cardiology

## 2021-03-14 DIAGNOSIS — R570 Cardiogenic shock: Secondary | ICD-10-CM | POA: Diagnosis present

## 2021-03-14 DIAGNOSIS — L8990 Pressure ulcer of unspecified site, unspecified stage: Secondary | ICD-10-CM | POA: Diagnosis present

## 2021-03-14 DIAGNOSIS — I501 Left ventricular failure: Secondary | ICD-10-CM | POA: Diagnosis present

## 2021-03-14 LAB — BASIC METABOLIC PANEL
Anion gap: 13 (ref 5–15)
BUN: 42 mg/dL — ABNORMAL HIGH (ref 8–23)
CO2: 24 mmol/L (ref 22–32)
Calcium: 8.1 mg/dL — ABNORMAL LOW (ref 8.9–10.3)
Chloride: 101 mmol/L (ref 98–111)
Creatinine, Ser: 1.85 mg/dL — ABNORMAL HIGH (ref 0.44–1.00)
GFR, Estimated: 26 mL/min — ABNORMAL LOW (ref >60)
Glucose, Bld: 154 mg/dL — ABNORMAL HIGH (ref 70–99)
Potassium: 3.2 mmol/L — ABNORMAL LOW (ref 3.5–5.1)
Sodium: 138 mmol/L (ref 135–145)

## 2021-03-14 LAB — COOXEMETRY PANEL
Carboxyhemoglobin: 1.2 % (ref 0.5–1.5)
Methemoglobin: 0.7 % (ref 0.0–1.5)
O2 Saturation: 73.2 %
Total hemoglobin: 10.9 g/dL — ABNORMAL LOW (ref 12.0–16.0)

## 2021-03-14 LAB — UREA NITROGEN, URINE: Urea Nitrogen, Ur: 770 mg/dL

## 2021-03-14 LAB — LIPID PANEL
Cholesterol: 184 mg/dL (ref 0–200)
HDL: 72 mg/dL (ref >40)
LDL Cholesterol: 90 mg/dL (ref 0–99)
Total CHOL/HDL Ratio: 2.6 RATIO
Triglycerides: 108 mg/dL (ref <150)
VLDL: 22 mg/dL (ref 0–40)

## 2021-03-14 LAB — CBC
HCT: 34.2 % — ABNORMAL LOW (ref 36.0–46.0)
Hemoglobin: 10.8 g/dL — ABNORMAL LOW (ref 12.0–15.0)
MCH: 29.5 pg (ref 26.0–34.0)
MCHC: 31.6 g/dL (ref 30.0–36.0)
MCV: 93.4 fL (ref 80.0–100.0)
Platelets: 270 10*3/uL (ref 150–400)
RBC: 3.66 MIL/uL — ABNORMAL LOW (ref 3.87–5.11)
RDW: 14.6 % (ref 11.5–15.5)
WBC: 29.3 10*3/uL — ABNORMAL HIGH (ref 4.0–10.5)
nRBC: 0 % (ref 0.0–0.2)

## 2021-03-14 LAB — PROCALCITONIN
Procalcitonin: 0.13 ng/mL
Procalcitonin: 0.23 ng/mL
Procalcitonin: 0.38 ng/mL

## 2021-03-14 LAB — HEPARIN LEVEL (UNFRACTIONATED)
Heparin Unfractionated: 0.4 IU/mL (ref 0.30–0.70)
Heparin Unfractionated: 0.36 IU/mL (ref 0.30–0.70)

## 2021-03-14 LAB — LACTIC ACID, PLASMA: Lactic Acid, Venous: 1.4 mmol/L (ref 0.5–1.9)

## 2021-03-14 LAB — MRSA NEXT GEN BY PCR, NASAL: MRSA by PCR Next Gen: NOT DETECTED

## 2021-03-14 LAB — GLUCOSE, CAPILLARY
Glucose-Capillary: 146 mg/dL — ABNORMAL HIGH (ref 70–99)
Glucose-Capillary: 132 mg/dL — ABNORMAL HIGH (ref 70–99)
Glucose-Capillary: 191 mg/dL — ABNORMAL HIGH (ref 70–99)

## 2021-03-14 SURGERY — RIGHT/LEFT HEART CATH AND CORONARY ANGIOGRAPHY
Anesthesia: LOCAL

## 2021-03-14 MED ORDER — NOREPINEPHRINE 4 MG/250ML-% IV SOLN
0.0000 ug/min | INTRAVENOUS | Status: DC
  Administered 2021-03-14: 26 ug/min via INTRAVENOUS
  Administered 2021-03-14: 18 ug/min via INTRAVENOUS
  Administered 2021-03-14 (×2): 22 ug/min via INTRAVENOUS
  Administered 2021-03-14: 20 ug/min via INTRAVENOUS
  Filled 2021-03-14 (×2): qty 250
  Filled 2021-03-14: qty 500
  Filled 2021-03-14: qty 250

## 2021-03-14 MED ORDER — LIDOCAINE-PRILOCAINE 2.5-2.5 % EX CREA
TOPICAL_CREAM | Freq: Once | CUTANEOUS | Status: DC
  Filled 2021-03-14: qty 5

## 2021-03-14 MED ORDER — NOREPINEPHRINE 16 MG/250ML-% IV SOLN
0.0000 ug/min | INTRAVENOUS | Status: DC
  Administered 2021-03-14: 28 ug/min via INTRAVENOUS
  Administered 2021-03-15: 26 ug/min via INTRAVENOUS
  Administered 2021-03-15: 24 ug/min via INTRAVENOUS
  Administered 2021-03-16: 26 ug/min via INTRAVENOUS
  Administered 2021-03-16: 28 ug/min via INTRAVENOUS
  Administered 2021-03-17: 30 ug/min via INTRAVENOUS
  Administered 2021-03-18: 40 ug/min via INTRAVENOUS
  Administered 2021-03-18: 33 ug/min via INTRAVENOUS
  Filled 2021-03-14 (×10): qty 250

## 2021-03-14 MED ORDER — ACETAMINOPHEN 325 MG PO TABS
650.0000 mg | ORAL_TABLET | Freq: Four times a day (QID) | ORAL | Status: DC | PRN
  Administered 2021-03-14 – 2021-03-15 (×3): 650 mg via ORAL
  Filled 2021-03-14 (×3): qty 2

## 2021-03-14 MED ORDER — POTASSIUM CHLORIDE 20 MEQ PO PACK
20.0000 meq | PACK | Freq: Once | ORAL | Status: AC
  Administered 2021-03-14: 20 meq via ORAL

## 2021-03-14 MED ORDER — FENTANYL CITRATE PF 50 MCG/ML IJ SOSY: 12.5000 ug | PREFILLED_SYRINGE | Freq: Once | INTRAMUSCULAR | Status: AC

## 2021-03-14 MED ORDER — AMIODARONE HCL IN DEXTROSE 360-4.14 MG/200ML-% IV SOLN
60.0000 mg/h | INTRAVENOUS | Status: DC
  Administered 2021-03-14 – 2021-03-15 (×4): 30 mg/h via INTRAVENOUS
  Administered 2021-03-15 – 2021-03-18 (×9): 60 mg/h via INTRAVENOUS
  Filled 2021-03-14 (×6): qty 200
  Filled 2021-03-14: qty 400
  Filled 2021-03-14 (×7): qty 200

## 2021-03-14 MED ORDER — AMIODARONE HCL IN DEXTROSE 360-4.14 MG/200ML-% IV SOLN: 60.0000 mg/h | INTRAVENOUS | Status: AC

## 2021-03-14 MED ORDER — SODIUM CHLORIDE 0.9 % IV SOLN: INTRAVENOUS | Status: DC | PRN

## 2021-03-14 MED ORDER — VASOPRESSIN 20 UNITS/100 ML INFUSION FOR SHOCK
0.0000 [IU]/min | INTRAVENOUS | Status: DC
  Administered 2021-03-14: 0.02 [IU]/min via INTRAVENOUS
  Administered 2021-03-15: 0.03 [IU]/min via INTRAVENOUS
  Administered 2021-03-15 – 2021-03-16 (×2): 0.02 [IU]/min via INTRAVENOUS
  Administered 2021-03-17: 0.03 [IU]/min via INTRAVENOUS
  Administered 2021-03-18: 0.02 [IU]/min via INTRAVENOUS
  Filled 2021-03-14 (×7): qty 100

## 2021-03-14 MED ORDER — POTASSIUM CHLORIDE 20 MEQ PO PACK
20.0000 meq | PACK | Freq: Once | ORAL | Status: DC
  Filled 2021-03-14: qty 1

## 2021-03-14 MED ORDER — FENTANYL CITRATE PF 50 MCG/ML IJ SOSY: 12.5000 ug | PREFILLED_SYRINGE | Freq: Once | INTRAMUSCULAR | Status: DC

## 2021-03-14 MED ORDER — FENTANYL CITRATE PF 50 MCG/ML IJ SOSY: 25.0000 ug | PREFILLED_SYRINGE | Freq: Once | INTRAMUSCULAR | Status: AC

## 2021-03-14 MED ORDER — FENTANYL CITRATE PF 50 MCG/ML IJ SOSY
PREFILLED_SYRINGE | INTRAMUSCULAR | Status: AC
  Administered 2021-03-14: 12.5 ug via INTRAVENOUS
  Filled 2021-03-14: qty 1

## 2021-03-14 MED ORDER — AMIODARONE HCL IN DEXTROSE 360-4.14 MG/200ML-% IV SOLN
INTRAVENOUS | Status: AC
  Filled 2021-03-14: qty 200

## 2021-03-14 MED ORDER — LIDOCAINE HCL (PF) 1 % IJ SOLN
INTRAMUSCULAR | Status: AC
  Administered 2021-03-14: 1 mL via INTRADERMAL
  Filled 2021-03-14: qty 5

## 2021-03-14 MED ORDER — FENTANYL CITRATE PF 50 MCG/ML IJ SOSY
PREFILLED_SYRINGE | INTRAMUSCULAR | Status: AC
  Administered 2021-03-14: 25 ug via INTRAVENOUS
  Filled 2021-03-14: qty 1

## 2021-03-14 MED ORDER — MAGNESIUM SULFATE 2 GM/50ML IV SOLN
2.0000 g | Freq: Once | INTRAVENOUS | Status: AC
  Administered 2021-03-14: 2 g via INTRAVENOUS
  Filled 2021-03-14: qty 50

## 2021-03-14 NOTE — Procedures (Signed)
Arterial Catheter Insertion Procedure Note  [name]  [id number]  Jul 01, 1931  Date:02/26/2021  Time:4:25 PM    Provider Performing: [name]    Procedure: Insertion of Arterial Line [phone]) with US guidance ([id number])   Indication(s) Blood pressure monitoring and/or need for frequent ABGs  Consent Risks of the procedure as well as the alternatives and risks of each were explained to the patient and/or caregiver.  Consent for the procedure was obtained and is signed in the bedside chart  Anesthesia 1% lidocaine infiltration and fentanyl 12.5 premedication.    Time Out Verified patient identification, verified procedure, site/side was marked, verified correct patient position, special equipment/implants available, medications/allergies/relevant history reviewed, required imaging and test results available.   Sterile Technique Maximal sterile technique including full sterile barrier drape, hand hygiene, sterile gown, sterile gloves, mask, hair covering, sterile ultrasound probe cover (if used).   Procedure Description Area of catheter insertion was cleaned with chlorhexidine and draped in sterile fashion. With real-time ultrasound guidance an arterial catheter was placed into the right  axillary  artery.  Appropriate arterial tracings confirmed on monitor.   Difficult cannulation due to very mobile artery.   Complications/Tolerance None; patient tolerated the procedure well.   EBL Minimal   Specimen(s) None  [name], MD [facility] ICU Physician [place]  Pager: [phone] Or Epic Secure Chat After hours: [phone].  03/20/2021, 4:26 PM

## 2021-03-14 NOTE — Progress Notes (Signed)
0700 - Report received from PM RN.  All questions answered.  Safety checks performed.  All lines and drips verified. Hand hygiene performed before/after each pt contact. 0730 - Assessment & Rx. Amy, HF NP rounded. 59 Minette Brine, RN and Sonia Baller, RN listening for pt while I went to swallow study with my other pt. Dr. Aundra Dubin rounded. 9 Colletta Maryland, CC PA to do a-line.  Consent confirmed.  Time out performed.  Appropriate PPE worn by Colletta Maryland, CC PA and myself.  Site identified.  Lidocaine administered to the site to numb it.  Unable to gain arterial access. 51 - Dr. Lynetta Mare to do a-line.  Consent confirmed.  Time out performed.  Appropriate PPE worn by Dr. Lynetta Mare and myself.  Site identified.  Fentanyl administered. Lidocaine administered.  Unable to obtain arterial access. 1445 - Pt requesting diet and C/O HA.  Dr. Lynetta Mare notified.  He okayed a diet order. 51 - Dr. Lynetta Mare to do a-line.  Consent confirmed.  Time out performed.  Appropriate PPE worn by Dr. Lynetta Mare and myself.  Site identified.  Fentanyl administered. Lidocaine administered.  1550 - A-line placed. 1645 - Pt advised her purewick leaked.  Pt bathed with soap and CHG.  Pt's linens changed.  Pt's CVC dsg changed with Joellen Jersey, RN present at bedside.  Pt's dinner arrived. 1845 - Pt placed on bedpan. 1900 - Report given to PM RN.  All questions answered.

## 2021-03-14 NOTE — Progress Notes (Signed)
Chaplain Melvenia Beam had opportunity to meet with patient who requested prayer as she is experiencing many life transitions. Nursing staff continued patient care while we visited. Patient states she is legally blind, but can see my face. Patient told me about her daughters who entered room as we were talking. Offered prayer for patient healing and peace for during the many life transitions she is going through. Chaplain is available for further consultation upon request.
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
## 2021-03-14 NOTE — Progress Notes (Addendum)
Advanced Heart Failure Rounding Note  PCP-Cardiologist: Candee Furbish, MD   Subjective:    1/17 Cardio/septic shock. Started on norepi + milrinone + lasix drip. Initial CO-OX 29%.  Placed on IV antibiotics.   Overnight milrinone increased to 0.375 mcg and norepi increased to 18 mcg. Started on amio drip due to A fib RVR.   Intermittently confused. Placed on HFNC up to 13 liters [place].   CVP 8  CO-OX 73%.  QX calculate Fick --->6.1 SVR --> 904  WBC-22>29  Bld Cx-NGTD  Complaining of fatigue.    Objective:   Weight Range: 69.1 kg Body mass index is 26.99 kg/m.   Vital Signs:   Temp:  [98 F (36.7 C)] 98 F (36.7 C) (01/17 2200) Pulse Rate:  [85-126] 126 (01/18 0545) Resp:  [18-39] 25 (01/18 0545) BP: (71-120)/(52-103) 96/68 (01/18 0545) SpO2:  [75 %-100 %] 96 % (01/18 0545) Weight:  [69.1 kg] 69.1 kg (01/18 0419)    Weight change: Filed Weights   02/25/2021 0000 03/09/2021 0419  Weight: 69.1 kg 69.1 kg    Intake/Output:   Intake/Output Summary (Last 24 hours) at 03/21/2021 0655 Last data filed at 03/15/2021 0500 Gross per 24 hour  Intake 759.82 ml  Output 1575 ml  Net -815.18 ml      Physical Exam   CVP 7-8  General:  Drowsy but arousable.  HEENT: Normal Neck: Supple. JVP 7-8 . Carotids 2+ bilat; no bruits. No lymphadenopathy or thyromegaly appreciated. Cor: PMI nondisplaced. Tachy/Irregular rate & rhythm. No rubs, gallops or murmurs. Lungs:Decreased in the bases on HFNC  Abdomen: Soft, nontender, nondistended. No hepatosplenomegaly. No bruits or masses. Good bowel sounds. Extremities: No cyanosis, clubbing, rash, edema Neuro: Drowsy, cranial nerves grossly intact. moves all 4 extremities w/o difficulty. Affect flat    Telemetry   A fib RVR 100-130s   EKG   A fib 113 bpm personally reviewed.  Labs    CBC Recent Labs    03/11/2021 2029 03/21/2021 0302 03/21/2021 0641 03/21/2021 1408 03/06/2021 0509  WBC 15.3*  --  22.7*  --  29.3*  NEUTROABS 14.3*   --   --   --   --   HGB 12.0   < > 12.2 12.9 10.8*  HCT 39.5   < > 38.2 38.0 34.2*  MCV 96.3  --  94.6  --  93.4  PLT 356  --  339  --  270   < > = values in this interval not displayed.   Basic Metabolic Panel Recent Labs    03/17/2021 0123 03/11/2021 0302 03/11/2021 0641 03/11/2021 1408 03/04/2021 0509  NA  --    < > 139 134* 138  K  --    < > 5.0 5.0 3.2*  CL  --   --  101  --  101  CO2  --   --  21*  --  24  GLUCOSE  --   --  152*  --  154*  BUN  --   --  35*  --  42*  CREATININE  --   --  1.89*  --  1.85*  CALCIUM  --   --  9.4  --  8.1*  MG 1.9  --   --   --   --    < > = values in this interval not displayed.   Liver Function Tests Recent Labs    03/09/2021 2227  AST 39  ALT 20  ALKPHOS 73  BILITOT  0.8  PROT 7.4  ALBUMIN 4.0   No results for input(s): LIPASE, AMYLASE in the last 72 hours. Cardiac Enzymes No results for input(s): CKTOTAL, CKMB, CKMBINDEX, TROPONINI in the last 72 hours.  BNP: BNP (last 3 results) Recent Labs    03/26/2021 0123  BNP 1,091.5*    ProBNP (last 3 results) No results for input(s): PROBNP in the last 8760 hours.   D-Dimer No results for input(s): DDIMER in the last 72 hours. Hemoglobin A1C Recent Labs    03/19/2021 2227  HGBA1C 5.9*   Fasting Lipid Panel Recent Labs    02/25/2021 0509  CHOL 184  HDL 72  LDLCALC 90  TRIG 108  CHOLHDL 2.6   Thyroid Function Tests Recent Labs    03/03/2021 2227  TSH 0.390    Other results:   Imaging    DG Chest Portable 1 View  Result Date: 03/08/2021 CLINICAL DATA:  Central line placement EXAM: PORTABLE CHEST 1 VIEW COMPARISON:  Chest radiograph 1 day prior FINDINGS: There is a new left-sided vascular catheter terminating in the right atrium. The cardiomediastinal silhouette is stable. Patchy opacities in both lower lobes are again seen, likely not significantly changed allowing for differences in patient rotation. There is no significant pleural effusion. There is no pneumothorax.  IMPRESSION: 1. New left-sided central venous catheter tip terminating in the right atrium. No pneumothorax. 2. Patchy opacities in both lung bases, likely not significantly changed allowing for differences in patient rotation. Findings could reflect atelectasis or infection. Electronically Signed   By: Valetta Mole M.D.   On: 03/04/2021 13:41   ECHOCARDIOGRAM COMPLETE  Result Date: 03/22/2021    ECHOCARDIOGRAM REPORT   Patient Name:   [name] Date of Exam: 02/27/2021 Medical Rec #:  [id number]        Height:       63.0 in Accession #:    [id number]       Weight:       151.0 lb Date of Birth:  [birth year]        BSA:          1.716 m Patient Age:    88 years         BP:           116/103 mmHg Patient Gender: F                HR:           108 bpm. Exam Location:  Inpatient Procedure: 2D Echo, Cardiac Doppler and Color Doppler Indications:    NSTEMI  History:        Patient has prior history of Echocardiogram examinations, most                 recent 02/12/2021. Aortic Valve Disease and Mitral Valve                 Disease, Arrythmias:Atrial Fibrillation; Risk                 Factors:Hypertension. HLD/ MR/ DYSRHYTHMIA.  Sonographer:    Beryle Beams Referring Phys: [id number] [place]  1. Severely reduced LV function, EF 20-25%. WMA concerning for LAD infarction vs stress induced cardiomyopathy. LVOT VTI 7.0 cm which equates to CO 1.8 L/min and 1.0 L/min/m2. Left ventricular ejection fraction, by estimation, is 20 to 25%. The left ventricle has severely decreased function. The left ventricle demonstrates regional wall motion abnormalities (see scoring diagram/findings for description). The left ventricular internal cavity  size was mildly dilated. Left ventricular diastolic function could not be evaluated.  2. Severe low flow low gradient aortic stenosis is present. AS is likely critical. Vmax 2.5 m/s, MG 16 mmHG, AVA 0.43 cm2, DI 0.19. SV index 10cc/m2. The aortic valve is calcified. Aortic  valve regurgitation is not visualized. Severe aortic valve stenosis.  3. Right ventricular systolic function is severely reduced. The right ventricular size is moderately enlarged. There is moderately elevated pulmonary artery systolic pressure. The estimated right ventricular systolic pressure is 16.0 mmHg.  4. Left atrial size was severely dilated.  5. Right atrial size was severely dilated.  6. The mitral valve is degenerative. Moderate to severe mitral valve regurgitation.  7. Tricuspid valve regurgitation is moderate.  8. The inferior vena cava is dilated in size with <50% respiratory variability, suggesting right atrial pressure of 15 mmHg. Comparison(s): Changes from prior study are noted. EF is now reduced with WMA. Concerns for low output. Critical AS. FINDINGS  Left Ventricle: Severely reduced LV function, EF 20-25%. WMA concerning for LAD infarction vs stress induced cardiomyopathy. LVOT VTI 7.0 cm which equates to CO 1.8 L/min and 1.0 L/min/m2. Left ventricular ejection fraction, by estimation, is 20 to 25%.  The left ventricle has severely decreased function. The left ventricle demonstrates regional wall motion abnormalities. The left ventricular internal cavity size was mildly dilated. There is no left ventricular hypertrophy. Left ventricular diastolic function could not be evaluated due to atrial fibrillation. Left ventricular diastolic function could not be evaluated.  LV Wall Scoring: The mid and distal anterior septum, mid inferoseptal segment, apical anterior segment, apical inferior segment, and apex are akinetic. Right Ventricle: The right ventricular size is moderately enlarged. No increase in right ventricular wall thickness. Right ventricular systolic function is severely reduced. There is moderately elevated pulmonary artery systolic pressure. The tricuspid regurgitant velocity is 2.80 m/s, and with an assumed right atrial pressure of 15 mmHg, the estimated right ventricular systolic  pressure is 73.7 mmHg. Left Atrium: Left atrial size was severely dilated. Right Atrium: Right atrial size was severely dilated. Pericardium: Trivial pericardial effusion is present. Mitral Valve: The mitral valve is degenerative in appearance. Mild to moderate mitral annular calcification. Moderate to severe mitral valve regurgitation. MV peak gradient, 49.3 mmHg. Tricuspid Valve: The tricuspid valve is grossly normal. Tricuspid valve regurgitation is moderate . No evidence of tricuspid stenosis. Aortic Valve: Severe low flow low gradient aortic stenosis is present. AS is likely critical. Vmax 2.5 m/s, MG 16 mmHG, AVA 0.43 cm2, DI 0.19. SV index 10cc/m2. The aortic valve is calcified. Aortic valve regurgitation is not visualized. Severe aortic stenosis is present. Aortic valve mean gradient measures 15.8 mmHg. Aortic valve peak gradient measures 24.6 mmHg. Aortic valve area, by VTI measures 0.43 cm. Pulmonic Valve: The pulmonic valve was grossly normal. Pulmonic valve regurgitation is not visualized. No evidence of pulmonic stenosis. Aorta: The aortic root is normal in size and structure. Venous: The inferior vena cava is dilated in size with less than 50% respiratory variability, suggesting right atrial pressure of 15 mmHg. IAS/Shunts: The atrial septum is grossly normal.  LEFT VENTRICLE PLAX 2D LVIDd:         4.59 cm     Diastology LVIDs:         4.40 cm     LV e' lateral: 10.70 cm/s LV PW:         1.50 cm LV IVS:        0.95 cm LVOT diam:  1.70 cm LV SV:         17 LV SV Index:   10 LVOT Area:     2.27 cm  LV Volumes (MOD) LV vol d, MOD A2C: 89.1 ml LV vol d, MOD A4C: 82.0 ml LV vol s, MOD A2C: 84.1 ml LV vol s, MOD A4C: 54.2 ml LV SV MOD A2C:     5.0 ml LV SV MOD A4C:     82.0 ml LV SV MOD BP:      17.7 ml RIGHT VENTRICLE RV S prime:     6.64 cm/s TAPSE (M-mode): 1.3 cm LEFT ATRIUM              Index        RIGHT ATRIUM           Index LA diam:        5.10 cm  2.97 cm/m   RA Area:     25.00 cm 14.57  cm/m LA Vol (A2C):   105.0 ml 61.19 ml/m LA Vol (A4C):   105.0 ml 61.19 ml/m LA Biplane Vol: 105.0 ml 61.19 ml/m  AORTIC VALVE                     PULMONIC VALVE AV Area (Vmax):    0.42 cm      PV Vmax:       0.45 m/s AV Area (Vmean):   0.41 cm      PV Peak grad:  0.8 mmHg AV Area (VTI):     0.43 cm AV Vmax:           248.05 cm/s AV Vmean:          189.094 cm/s AV VTI:            0.390 m AV Peak Grad:      24.6 mmHg AV Mean Grad:      15.8 mmHg LVOT Vmax:         45.57 cm/s LVOT Vmean:        33.779 cm/s LVOT VTI:          0.074 m LVOT/AV VTI ratio: 0.19  AORTA Ao Root diam: 2.80 cm Ao Asc diam:  2.50 cm MITRAL VALVE             TRICUSPID VALVE MV Peak grad: 49.3 mmHg  TR Peak grad:   31.4 mmHg MV Vmax:      3.51 m/s   TR Vmax:        280.00 cm/s MV Vmean:     353.0 cm/s                          SHUNTS                          Systemic VTI:  0.07 m                          Systemic Diam: 1.70 cm Eleonore Chiquito MD Electronically signed by Eleonore Chiquito MD Signature Date/Time: 03/15/2021/10:16:21 AM    Final      Medications:     Scheduled Medications:  aspirin EC  81 mg Oral Daily   atorvastatin  40 mg Oral Q2000   Chlorhexidine Gluconate Cloth  6 each Topical Daily   cholecalciferol  2,000 Units Oral Daily   levothyroxine  50 mcg Oral QODAY  levothyroxine  75 mcg Oral QODAY   mouth rinse  15 mL Mouth Rinse BID    Infusions:  sodium chloride     amiodarone     amiodarone 30 mg/hr (03/27/2021 0037)   amiodarone     ceFEPime (MAXIPIME) IV Stopped (03/15/2021 1159)   furosemide (LASIX) 200 mg in dextrose 5% 100 mL (2mg /mL) infusion 8 mg/hr (03/20/2021 0010)   heparin 850 Units/hr (02/28/2021 0500)   milrinone 0.375 mcg/kg/min (03/03/2021 0500)   norepinephrine (LEVOPHED) Adult infusion 14 mcg/min (03/03/2021 0500)   [START ON 03/15/2021] vancomycin      PRN Medications: ondansetron (ZOFRAN) IV    Patient Profile  [name] is a 88 year old with a history of  severe AS, A fib/Aflutter, HTN,  HLD, CKD, hypothyroidism, leimyosarcoma 2003, and 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  Also being worked up for TAVR.   Admitted with NSTEMI + shock cardiogenic/septic.   Assessment/Plan   NSTEMI -HS Trop [phone]. Check another HS Trop -Plan for cath once recovered.  -On heparin drip  - Continue atorvastatin and aspirin.    2. Shock-->Sepsis + Cardiogenic  -? Had 2 teeth removed  03/17/2021  -Hypotensive. Lactic acid 7.4>4.4>3.4  -WBC 14>15>22>29.3 K  - Procalcitonin 0.1>0.2> pending  -Blood cultures pending.  -On vancomycin/cefepime.     3. Acute HFrEF--> possible cardiogenic shock  -Echo reviewed by Dr Aundra Dubin. EF dramatically different with EF down 25%.  RV moderately reduced. Possible Takotsubo. IVC dilated.  -Holding  BB -Initial CO-OX < 30%. Placed on norepi and milrinone and increased over the last 24 hours. Maps low this morning. Increased norepi to 18 mcg + milrinone 0.375 mcg. CO-OX improved to 73%.  - CVP 7-8 . Cut back lasix drip to 4 mg per hour.  - Supp K  - Follow renal    3. Aortic Stenosis  -Being worked up for TAVR.    4.CKD Stage  Creatinine baseline 1.5-1.7 Admit creatinine 1.9, today 1.85 Follow daily BMET    5. Afib , chronic--> RVR - Developed RVR and was started amio drip.  - Will continue to help with rate control.   -On heparin drip.     6. Acute Respiratory Failure -Placed HFNC  now at 13 liters.  - Sats stable.   7. Blindness, macular degeneration    8.  Leiomyosarcoma  Completed treatment 2004.   9. GOC  DNR    Length of Stay: 2  Amy Clegg, NP  03/19/2021, 6:55 AM  Advanced Heart Failure Team Pager [phone] (M-F; 7a - 5p)  Please contact [facility] for night-coverage after hours (5p -7a ) and weekends on [email or web address]  Patient seen with NP, agree with the above note.   She is currently on milrinone 0.375 and NE 14 with SBP in 90s.  Co-ox up to 73%, extremities warm.  SVR actually relatively low at 904.  Still on HFNC  (titrating down) though CVP only 9-10.  Creatinine stable 1.85.  She is afebrile with WBCs 29, CXR with possible PNA.  She is on vancomycin/cefepime.  PCT 0.23.   She is currently sleeping, per nursing is intermittently confused.   In atrial fibrillation on heparin gtt + amiodarone gtt with HR in 110s.   General: NAD, sleeping Neck: JVP 9-10 cm, no thyromegaly or thyroid nodule.  Lungs: Clear to auscultation bilaterally with normal respiratory effort. CV: Nondisplaced PMI.  Heart mildly tachy, irregular S1/S2, no S3/S4, 3/6 SEM RUSB with muffled S2.  No peripheral edema.  Abdomen: Soft, nontender,  no hepatosplenomegaly, no distention.  Skin: Intact without lesions or rashes.  Neurologic: Intemittently confused.  Psych: Normal affect. Extremities: No clubbing or cyanosis.  HEENT: Normal.   1. Shock: Concern for septic + cardiogenic etiology.  Occurred after extraction of infected teeth yesterday, ?odontogenic seeding of bloodstream.  Echo with LV EF 20-25% and moderate RVE/severe RV dysfunction; marked change from prior with biventricular failure and peri-apical akinesis of both the LV and RV, relative basal preservation; severe aortic stenosis, moderate-severe mitral regurgitation, dilated IVC. Biventricular failure could be a stress (Takotsubo-type) cardiomyopathy in setting of sepsis versus large LAD MI (but no ST elevation and while HS-TnI is elevated, it is not as high as one might expect with a large occluded LAD). Lactate initially 7.4, has trended down.  Co-ox up to 73% this morning on norepinephrine 14 + milrinone 0.375.  SVR is low in the 900s suggesting possible septic component. SBP 90s currently, CVP down to 9-10 on Lasix gtt. Creatinine stable at 1.85.   - BP reads highly variable, place arterial line left radial.  - Continue milrinone 0.375.  - Continue NE as needed for MAP.  - Trend lactate, check this am.  - Vancomycin/cefepime started empirically.  - If needs mechanical  support, can get IABP.  2. Acute systolic CHF: Echo this admission with LV EF 20-25% and moderate RVE/severe RV dysfunction; marked change from prior with biventricular failure and peri-apical akinesis of both the LV and RV, relative basal preservation; severe aortic stenosis, moderate-severe mitral regurgitation, dilated IVC. Marked change from 12/22.  As above, biventricular failure could be a stress (Takotsubo-type) cardiomyopathy in setting of sepsis versus large LAD MI (but no ST elevation and while HS-TnI is elevated, it is not as high as one might expect with a large occluded LAD). As above, CVP now lower 9-10 with co-ox improved 73%.  - Continue milrinone 0.375.  - Adjust NE as needed for MAP.  - Decrease Lasix gtt to 4 mg/hr today.  - Needs eventual cath (LHC/RHC) to sort out Takotsubo/stress CMP versus ischemic CMP.  No STEMI on ECG and no further chest pain, so would like to wait until she is more stable and creatinine has settled down before taking to cath lab. 3. CAD: NSTEMI with HS-TnI  [phone].  Troponin seems high for stress cardiomyopathy but somewhat low for acute LAD occlusion (and no STE).  Could additionally be demand ischemia with underlying diffuse CAD and hypotensive event.  No chest pain since she initially came to the ER yesterday (just dyspnea).  Therefore, think reasonable to hold off on LHC/RHC until she is more stable.  - Heparin gtt.  - ASA, statin.  - Plan LHC/RHC when more stable and creatinine trending down.  4. Aortic stenosis: Severe low flow/low gradient aortic stenosis.  Has started TAVR workup.  5. AKI on CKD stage 3: Baseline creatinine around 1.5.  2.09 at admission, 1.85 now.  - Support cardiac output and follow closely.  6. Mitral regurgitation: Moderate to severe on echo, likely functional.  May improve if she gets to TAVR in the future.  7. Atrial fibrillation: Chronic.  Rate in 100s for now.  - On heparin gtt.  - Continue amiodarone gtt 30  mg/hr for rate control.  8. Legally blind 9. ID: WBCs 29 but afebrile.  Concern for septic shock picture. ?Odontogenic seeding from dental extractions. ?PNA on CXR. PCT 0.23, not markedly high, but SVR low in 900s on pressor.  - Blood and urine cultures pending.  -  vancomycin/cefepime.  - Send PCT.  10. Acute hypoxemic respiratory failure: She is on HFNC but CVP not markedly elevated (9-10).  CXR with patchy airspace disease, ?PNA.  - Continue gentle diuresis as above. - Continue abx.  - CCM following.   CRITICAL CARE Performed by: Loralie Champagne  Total critical care time: 40 minutes  Critical care time was exclusive of separately billable procedures and treating other patients.  Critical care was necessary to treat or prevent imminent or life-threatening deterioration.  Critical care was time spent personally by me on the following activities: development of treatment plan with patient and/or surrogate as well as nursing, discussions with consultants, evaluation of patient's response to treatment, examination of patient, obtaining history from patient or surrogate, ordering and performing treatments and interventions, ordering and review of laboratory studies, ordering and review of radiographic studies, pulse oximetry and re-evaluation of patient's condition.  Loralie Champagne 03/25/2021 7:56 AM

## 2021-03-14 NOTE — Progress Notes (Signed)
RT attempted to place Aline x's2 and was unsuccessful. MD was made aware.

## 2021-03-14 NOTE — Progress Notes (Signed)
NAME:  [name], MRN:  [id number], DOB:  21-Feb-1932, LOS: 2 ADMISSION DATE:  03/04/2021, CONSULTATION DATE:  03/10/2021 REFERRING MD:  Aundra Dubin - HF, CHIEF COMPLAINT: Cardiogenic shock    History of Present Illness:  88 year old woman who was being evaluated in the outpatient setting for possible TAVR for severe AS. As part of this went for dental extraction x 2 on 1/15 (done under general anesthesia). Patient returned home from procedure and reported chest pressure. The following day (1/16) patient noted progressive pressure and associated SOB (for which she took SL NTG with some relief) as well as orthopnea and nausea.   In ED, WBC 15.1, LA 7.4, + trop, Echo w/ septal hypokinesis. Was to be admitted by cardiology. Initial diagnosed with NSTEMI felt to be 2/2 demand ischemia from surgical procedure, c/b severe symptomatic AS.  Oxygen and IV heparin initiated. Trop was noted to be climbing 1/17 (616 -> 9898). Lactate downtrended to 4.4, BNP 1091; however, WBC uptrended to 22.7. Medical service consulted for abx and management of possible sepsis; vanc and cefepime initiated and fluid resuscitated. Heart failure team consulted. Given progressive hypotension, PCCM consulted for assistance with management.  Pertinent Medical History:  Chronic AF/flutter, HTN, severe Aortic stenosis (was being evaluated by cards for possile TAVR) CKS, hypothyroidism, leimyocarcoma 2003, blindness for macular degeneration  DNR status w/ MOST form   Significant Hospital Events: Including procedures, antibiotic start and stop dates in addition to other pertinent events   1/16 admitted w/ SOB, weakness and orthopnea 1 day after dental extraction. + trop I admitted w/ working dx NSTEMI and acute AS. Also + lactic acidosis  1/17 WBC rising, lactate improved w/ IVFs. Started on vanc and cefepime. CCM consulted for progressive hypotension over course of day. Advanced HF also consulted.  1/18 WBC continues to rise (29 from  22), Hgb stable. CVP 8. Lasix gtt increased to 8. Net -872mL/24H. K 3.2, repleted. Multiple attempts at L radial A-line, unsuccessful. Remains on NE at 20, milrinone 0.375. Co-ox 73%. Preserving R radial for eventual cath. Cr stable.  Interim History / Subjective:  Feeling much better than yesterday In good spirits, pleasant and conversant Variable BP, suspect cuff pressures inaccurate as patient is on much more NE than expected L radial A-line attempted by RT x 2 and by CCM PA x 2, unsuccessful Net -830mL/24H, Lasix gtt increased Continues on milrinone with improved Co-ox  Objective   Blood pressure 96/68, pulse (!) 126, temperature (!) 97.5 F (36.4 C), temperature source Oral, resp. rate (!) 25, height 5\' 3"  (1.6 m), weight 69.1 kg, SpO2 96 %. CVP:  [9 mmHg-11 mmHg] 10 mmHg      Intake/Output Summary (Last 24 hours) at 03/24/2021 0833 Last data filed at 02/27/2021 0729 Gross per 24 hour  Intake 759.82 ml  Output 2075 ml  Net -1315.18 ml   Filed Weights   03/15/2021 0000 03/02/2021 0419  Weight: 69.1 kg 69.1 kg   Physical Examination: General: Chronically ill-appearing elderly woman in NAD. HEENT: [place]/AT, anicteric sclera, PERRL, dry mucous membranes. Neuro: Awake, oriented x 4. Responds to verbal stimuli. Following commands consistently. Moves all 4 extremities spontaneously. Strength 4-5/5 in all 4 extremities. CV: Mildly tachycardic, regular rhythm, harsh 3/6 crescendo/decrescendo murmur noted. PULM: Breathing even and unlabored on 12L HFNC. Lung fields grossly CTAB, faint bibasilar crackles. GI: Soft, nontender, nondistended. Normoactive bowel sounds. Extremities: Bilateral symmetric 1+ LE edema noted, particularly posteriorly. Skin: Warm/dry, no rashes.  Resolved Hospital Problem List:  Assessment & Plan:  Cardiogenic w/ acute systolic HF, +/- septic shock  Known severe AS. Now w/ last EF 55-60%. Ddx: New NSTEMI resulting in acute Systolic HF or Given recent dental  procedure and known poor dentition ? Sepsis 2/2 aspiration. ? Query mix of both. - HF team managing as primary - S/p CVC placement for transduction of CVP/Co-ox - Goal MAP > 65 - Continue NE, titrating to goal MAP - Continue milrinone, trend Co-ox - Continue cefepime/vanc - F/u finalized BCx - Trend WBC, PCT ok - Hold home antihypertensives - Consideration of short-term support device if necessary  NSTEMI w/ Known severe AS - Cardiology following, appreciate assistance - Manage cardiogenic/septic shock as above - Cardiac monitoring - Continue ASA, heparin - Plan for eventual LHC/RHC once hemodynamically stable to tolerate  Dyspnea w/ acute pulmonary edema vs aspiration PNA  PCXR w/worsening bilateral airspace disease. Favoring edema. - Continue O2 support with HFNC - Wean O2 for sat > 90% - Pulmonary hygiene - Diuresis as tolerated - Follow intermittent CXR - Antibiotics as above  CAF/flutter - Continue heparin gtt - Cardiac monitoring  Acute on chronic renal failure superimposed on CKD stage III Baseline scr 1.4 now 1.89 (peak 2.09 1/17) - Trend BMP - Replete electrolytes as indicated - Monitor I&Os - Avoid nephrotoxic agents as able - Ensure adequate renal perfusion  Hypothyroidism  - Continue synthroid  H/o Leiomyosarcoma  - S/p treatment (2004), outpatient f/u per usual surveillance protocol  DNR status - Noted  Best Practice: (right click and "Reselect all SmartList Selections" daily)   Diet/type: NPO DVT prophylaxis: systemic heparin GI prophylaxis: N/A Lines: Central line Foley:  Yes, and it is still needed Code Status:  DNR Last date of multidisciplinary goals of care discussion [Per primary]  Critical care time: 48 minutes   Lestine Mount, PA-C [facility] 03/25/2021 8:35 AM  Please see [email or web address] for pager details.  From 7A-7P if no response, please call [phone] After hours, please call ELink [phone]

## 2021-03-14 NOTE — Progress Notes (Signed)
Resting no complaints.   Family at bedside.  Afebrile.    Norepi up to 26 mcg and remains on milrinone 0.375 mcg + lasix drip 6 mg + amio 30 mg .   Remains in A fib RVR. 100-110s.   Plan for A-line. No change.     Mercie Balsley NP-C   2:00 PM

## 2021-03-14 NOTE — Progress Notes (Addendum)
Pt is intermittently confused. Pulling at tubes and monitoring equipment. Has removed two PIVs herself. Mittens applied which patient removes. Reorientation attempted but comprehension unclear.

## 2021-03-15 ENCOUNTER — Inpatient Hospital Stay (HOSPITAL_COMMUNITY): Payer: Medicare Other

## 2021-03-15 DIAGNOSIS — I214 Non-ST elevation (NSTEMI) myocardial infarction: Secondary | ICD-10-CM | POA: Diagnosis not present

## 2021-03-15 LAB — BASIC METABOLIC PANEL
Anion gap: 11 (ref 5–15)
Anion gap: 18 — ABNORMAL HIGH (ref 5–15)
Anion gap: 14 (ref 5–15)
BUN: 34 mg/dL — ABNORMAL HIGH (ref 8–23)
BUN: 29 mg/dL — ABNORMAL HIGH (ref 8–23)
BUN: 27 mg/dL — ABNORMAL HIGH (ref 8–23)
CO2: 27 mmol/L (ref 22–32)
CO2: 24 mmol/L (ref 22–32)
CO2: 24 mmol/L (ref 22–32)
Calcium: 7.1 mg/dL — ABNORMAL LOW (ref 8.9–10.3)
Calcium: 7.8 mg/dL — ABNORMAL LOW (ref 8.9–10.3)
Calcium: 7.1 mg/dL — ABNORMAL LOW (ref 8.9–10.3)
Chloride: 93 mmol/L — ABNORMAL LOW (ref 98–111)
Chloride: 92 mmol/L — ABNORMAL LOW (ref 98–111)
Chloride: 95 mmol/L — ABNORMAL LOW (ref 98–111)
Creatinine, Ser: 1.54 mg/dL — ABNORMAL HIGH (ref 0.44–1.00)
Creatinine, Ser: 1.57 mg/dL — ABNORMAL HIGH (ref 0.44–1.00)
Creatinine, Ser: 1.45 mg/dL — ABNORMAL HIGH (ref 0.44–1.00)
GFR, Estimated: 32 mL/min — ABNORMAL LOW (ref >60)
GFR, Estimated: 32 mL/min — ABNORMAL LOW (ref >60)
GFR, Estimated: 35 mL/min — ABNORMAL LOW (ref >60)
Glucose, Bld: 173 mg/dL — ABNORMAL HIGH (ref 70–99)
Glucose, Bld: 172 mg/dL — ABNORMAL HIGH (ref 70–99)
Glucose, Bld: 161 mg/dL — ABNORMAL HIGH (ref 70–99)
Potassium: 2.6 mmol/L — CL (ref 3.5–5.1)
Potassium: 3.7 mmol/L (ref 3.5–5.1)
Potassium: 3.4 mmol/L — ABNORMAL LOW (ref 3.5–5.1)
Sodium: 131 mmol/L — ABNORMAL LOW (ref 135–145)
Sodium: 134 mmol/L — ABNORMAL LOW (ref 135–145)
Sodium: 133 mmol/L — ABNORMAL LOW (ref 135–145)

## 2021-03-15 LAB — HEPATIC FUNCTION PANEL
ALT: 66 U/L — ABNORMAL HIGH (ref 0–44)
AST: 62 U/L — ABNORMAL HIGH (ref 15–41)
Albumin: 3 g/dL — ABNORMAL LOW (ref 3.5–5.0)
Alkaline Phosphatase: 58 U/L (ref 38–126)
Bilirubin, Direct: 0.2 mg/dL (ref 0.0–0.2)
Indirect Bilirubin: 0.7 mg/dL (ref 0.3–0.9)
Total Bilirubin: 0.9 mg/dL (ref 0.3–1.2)
Total Protein: 6.2 g/dL — ABNORMAL LOW (ref 6.5–8.1)

## 2021-03-15 LAB — COOXEMETRY PANEL
Carboxyhemoglobin: 1.1 % (ref 0.5–1.5)
Methemoglobin: 1 % (ref 0.0–1.5)
O2 Saturation: 58.7 %
Total hemoglobin: 9.1 g/dL — ABNORMAL LOW (ref 12.0–16.0)

## 2021-03-15 LAB — CBC
HCT: 31.4 % — ABNORMAL LOW (ref 36.0–46.0)
Hemoglobin: 10.2 g/dL — ABNORMAL LOW (ref 12.0–15.0)
MCH: 29.5 pg (ref 26.0–34.0)
MCHC: 32.5 g/dL (ref 30.0–36.0)
MCV: 90.8 fL (ref 80.0–100.0)
Platelets: 225 10*3/uL (ref 150–400)
RBC: 3.46 MIL/uL — ABNORMAL LOW (ref 3.87–5.11)
RDW: 14.5 % (ref 11.5–15.5)
WBC: 23.3 10*3/uL — ABNORMAL HIGH (ref 4.0–10.5)
nRBC: 0.1 % (ref 0.0–0.2)

## 2021-03-15 LAB — HEPARIN LEVEL (UNFRACTIONATED)
Heparin Unfractionated: 0.18 IU/mL — ABNORMAL LOW (ref 0.30–0.70)
Heparin Unfractionated: 0.28 IU/mL — ABNORMAL LOW (ref 0.30–0.70)

## 2021-03-15 LAB — GLUCOSE, CAPILLARY
Glucose-Capillary: 173 mg/dL — ABNORMAL HIGH (ref 70–99)
Glucose-Capillary: 181 mg/dL — ABNORMAL HIGH (ref 70–99)
Glucose-Capillary: 190 mg/dL — ABNORMAL HIGH (ref 70–99)

## 2021-03-15 LAB — MAGNESIUM: Magnesium: 2.5 mg/dL — ABNORMAL HIGH (ref 1.7–2.4)

## 2021-03-15 LAB — PROCALCITONIN: Procalcitonin: 0.28 ng/mL

## 2021-03-15 MED ORDER — POTASSIUM CHLORIDE CRYS ER 20 MEQ PO TBCR
40.0000 meq | EXTENDED_RELEASE_TABLET | ORAL | Status: AC
  Administered 2021-03-15 – 2021-03-16 (×2): 40 meq via ORAL
  Filled 2021-03-15 (×2): qty 2

## 2021-03-15 MED ORDER — POTASSIUM CHLORIDE 10 MEQ/50ML IV SOLN
INTRAVENOUS | Status: AC
  Administered 2021-03-15: 10 meq via INTRAVENOUS
  Filled 2021-03-15: qty 50

## 2021-03-15 MED ORDER — ACETAMINOPHEN 325 MG PO TABS
650.0000 mg | ORAL_TABLET | Freq: Four times a day (QID) | ORAL | Status: DC
  Administered 2021-03-15 – 2021-03-17 (×6): 650 mg via ORAL
  Filled 2021-03-15 (×9): qty 2

## 2021-03-15 MED ORDER — TRAMADOL HCL 50 MG PO TABS
50.0000 mg | ORAL_TABLET | Freq: Once | ORAL | Status: AC
  Administered 2021-03-15: 50 mg via ORAL
  Filled 2021-03-15: qty 1

## 2021-03-15 MED ORDER — ADULT MULTIVITAMIN W/MINERALS CH
1.0000 | ORAL_TABLET | Freq: Every day | ORAL | Status: DC
  Administered 2021-03-16 – 2021-03-18 (×3): 1 via ORAL
  Filled 2021-03-15 (×4): qty 1

## 2021-03-15 MED ORDER — POLYETHYLENE GLYCOL 3350 17 G PO PACK
17.0000 g | PACK | Freq: Every day | ORAL | Status: DC
  Administered 2021-03-16 – 2021-03-17 (×2): 17 g via ORAL
  Filled 2021-03-15 (×3): qty 1

## 2021-03-15 MED ORDER — MIDODRINE HCL 5 MG PO TABS
5.0000 mg | ORAL_TABLET | Freq: Three times a day (TID) | ORAL | Status: DC
  Administered 2021-03-15 (×3): 5 mg via ORAL
  Filled 2021-03-15 (×3): qty 1

## 2021-03-15 MED ORDER — DOCUSATE SODIUM 100 MG PO CAPS
100.0000 mg | ORAL_CAPSULE | Freq: Every day | ORAL | Status: DC
  Administered 2021-03-16 – 2021-03-17 (×2): 100 mg via ORAL
  Filled 2021-03-15 (×3): qty 1

## 2021-03-15 MED ORDER — KATE FARMS STANDARD 1.4 PO LIQD
325.0000 mL | Freq: Three times a day (TID) | ORAL | Status: DC
  Administered 2021-03-16 – 2021-03-18 (×5): 325 mL via ORAL
  Filled 2021-03-15 (×10): qty 325

## 2021-03-15 MED ORDER — POTASSIUM CHLORIDE 20 MEQ PO PACK
40.0000 meq | PACK | Freq: Once | ORAL | Status: AC
  Administered 2021-03-15: 40 meq via ORAL
  Filled 2021-03-15: qty 2

## 2021-03-15 MED ORDER — POTASSIUM CHLORIDE 10 MEQ/50ML IV SOLN
10.0000 meq | INTRAVENOUS | Status: DC
  Administered 2021-03-15 (×5): 10 meq via INTRAVENOUS
  Filled 2021-03-15 (×4): qty 50

## 2021-03-15 NOTE — Progress Notes (Signed)
Chaplain Melvenia Beam had opportunity to make follow up visit with patient. Patient was asleep upon my arrival, which gave me opportunity to meet and visit with patient's "significant-other" of 42 years. Richard lives in [place], [place], but formerly lived in [place]. He shared the story of them meeting in the church choir and having a meaningful relationship of companionship ever since. He has been at her bed side nearly the entire time since her admission on Monday evening. He shared that he may have to return to [facility] on Saturday, but I was able to suggest an alternative to his having to go that would accommodate his needing to leave her side. He was grateful for the recommendation and will see if he can make the alternative arrangements which would allow him to stay here throughout her stay in the hospital. Delfino Lovett also shared that patient is his primary family, other than his older brother and his church family in [place]. He shared that he has always been well received by patient's daughter throughout their 18 year relationship. Patient was awakened during doctor visit. This then afforded the three of [place] time to pray per patient's request before leaving. Chaplain is available for further consultation per patient or family request.

## 2021-03-15 NOTE — Progress Notes (Signed)
° °  Inpatient Rehab Admissions Coordinator :  Per therapy recommendations, patient was screened for CIR candidacy by Danne Baxter RN MSN.  At this time patient appears to be a potential candidate for CIR. Please place rehab consult if you would like her considered for possible admit.   Danne Baxter RN MSN Admissions Coordinator [phone]

## 2021-03-15 NOTE — Progress Notes (Signed)
Initial Nutrition Assessment  DOCUMENTATION CODES:   Not applicable  INTERVENTION:   [employer] 1.4 PO TID, each supplement provides 455 kcal and 20 grams protein  -MVI with Minerals daily  -Liberalize diet to REGULAR; discussed nutritional concerns in detail with Dr. Aundra Dubin. OK to liberalize diet per MD  -Pt requires soft foods currently given edentulous post dental extraction  -Food with many food intolerances/allergies including gluten, soy, egg, dairy (except she can eat some cheese and yogurt-although they indicate she eats coconut yogurt), MSG, sugar substitutes, onions. Discussed food preferences with pt and family. Also discussed with Patient Services Team in [employer] who will follow and assist patient  NUTRITION DIAGNOSIS:   Inadequate oral intake related to poor appetite, acute illness (limited diet) as evidenced by per patient/family report, meal completion < 25%.  GOAL:   Patient will meet greater than or equal to 90% of their needs  MONITOR:   PO intake, Supplement acceptance, Weight trends, Labs  REASON FOR ASSESSMENT:   Consult Assessment of nutrition requirement/status  ASSESSMENT:   88 yo female admitted with SOB and weakness one day post dental extraction with NSTEMI, developed cardiogenic +/- septic shock, PMH includes CKD 4, HLD, HTN  1/16 Admitted  Pt remains on levophed and vasopressin; on lasix gtt  Daughter and pt's boyfriend at bedside. Pt appears very tired/weak. Per report, pt with many food intolerances/allergies including gluten, soy, egg, dairy (except she can eat some cheese and yogurt-although they indicate she likes coconut yogurt), MSG, sugar substitutes, onions  Per family, pt has only eaten applesauce since admission. Discussed food preferences and usual dietary patterns with family but pt not interested in eating much of anything at this time. Pt only wanting applesauce and coconut yogurt.   Per report, prior to admission pt  eating well.   Discussed D[employer] oral nutrition supplement with pt and family; this formula is gluten, dairy, soy free. It is vegan, made with pea protein. All agreeable for pt to try this  Pt also with recent dental extraction of 2 front teeth this week; pt is now edentulous and does not have dentures at this time. Pt requiring soft foods.   Current wt 68.8 kg; weight stable recently per family  Labs: sodium 131 (L), potassium 2.6 (L) Meds: Vit D,  lasix gtt   Diet Order:  Heart Healthy/Carb Modified   EDUCATION NEEDS:   Education needs have been addressed  Skin:  Skin Assessment: Skin Integrity Issues: Skin Integrity Issues:: Stage I Stage I: buttocks  Last BM:  no documented BM  Height:   Ht Readings from Last 1 Encounters:  03/17/2021 5\' 3"  (1.6 m)    Weight:   Wt Readings from Last 1 Encounters:  03/15/21 68.8 kg    BMI:  Body mass index is 26.87 kg/m.  Estimated Nutritional Needs:   Kcal:  1800-2000 kcals  Protein:  85-100 g  Fluid:  1.8 L  Kerman Passey MS, RDN, LDN, CNSC Registered Dietitian III Clinical Nutrition RD Pager and On-Call Pager Number Located in [place]

## 2021-03-15 NOTE — Evaluation (Signed)
Occupational Therapy Evaluation Patient Details Name: [name] MRN: [id number] DOB: 1931-05-03 Today's Date: 03/15/2021   History of Present Illness 88 yo female presenting to ED on 1/16 with chest pain. Admitted for NSTEMI.   PMH including severe AS, a fib, HTN, HLD, CKD, hypothroidism, and 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   Clinical Impression   PTA, pt was living alone and was independent with ADLs and light ADLs, has someone who assists with IADLs, and uses a rollator for mobility. Pt currently requiring Mod A for UB ADLs, Max A for LB ADLs, and Min A +2 for sit<>Stand from EOB. Pt presenting with decreased balance, strength, and activity tolerance. Pt would benefit from further acute OT to facilitate safe dc. Due to pt's high motivation, PLOF, and good family support, recommend dc to AIR for further OT to optimize safety, independence with ADLs, and return to PLOF.      Recommendations for follow up therapy are one component of a multi-disciplinary discharge planning process, led by the attending physician.  Recommendations may be updated based on patient status, additional functional criteria and insurance authorization.   Follow Up Recommendations  Acute inpatient rehab (3hours/day)    Assistance Recommended at Discharge Frequent or constant Supervision/Assistance  Patient can return home with the following A lot of help with bathing/dressing/bathroom;Two people to help with walking and/or transfers    Functional Status Assessment  Patient has had a recent decline in their functional status and demonstrates the ability to make significant improvements in function in a reasonable and predictable amount of time.  Equipment Recommendations  BSC/3in1    Recommendations for Other Services PT consult;Rehab consult     Precautions / Restrictions Precautions Precautions: Fall Precaution Comments: a-line Restrictions Weight Bearing Restrictions: No      Mobility Bed  Mobility Overal bed mobility: Needs Assistance Bed Mobility: Rolling, Supine to Sit, Sit to Supine Rolling: Mod assist   Supine to sit: Mod assist, +2 for physical assistance, HOB elevated Sit to supine: Max assist, +2 for physical assistance        Transfers Overall transfer level: Needs assistance Equipment used: 2 person hand held assist Transfers: Sit to/from Stand Sit to Stand: Min assist, +2 physical assistance, From elevated surface           General transfer comment: Min A to initate power up      Balance Overall balance assessment: Needs assistance Sitting-balance support: Single extremity supported, Bilateral upper extremity supported, Feet supported Sitting balance-Leahy Scale: Poor Sitting balance - Comments: pt with UE support of bed throughout sitting   Standing balance support: Bilateral upper extremity supported Standing balance-Leahy Scale: Poor                             ADL either performed or assessed with clinical judgement   ADL Overall ADL's : Needs assistance/impaired Eating/Feeding: Set up;Sitting;Bed level   Grooming: Minimal assistance;Sitting;Bed level   Upper Body Bathing: Moderate assistance;Sitting;Bed level   Lower Body Bathing: Maximal assistance;+2 for physical assistance;Sit to/from stand   Upper Body Dressing : Moderate assistance;Sitting   Lower Body Dressing: Maximal assistance;Sit to/from stand;+2 for safety/equipment;+2 for physical assistance               Functional mobility during ADLs: Minimal assistance;+2 for physical assistance;+2 for safety/equipment General ADL Comments: Pt tolerating sitting at EOB and then performing sit<>stand to change the cheets     Vision Baseline Vision/History: 2 Legally  blind;6 Macular Degeneration       Perception     Praxis      Pertinent Vitals/Pain Pain Assessment Pain Assessment: No/denies pain     Hand Dominance Right   Extremity/Trunk Assessment  Upper Extremity Assessment Upper Extremity Assessment: Generalized weakness   Lower Extremity Assessment Lower Extremity Assessment: Defer to PT evaluation   Cervical / Trunk Assessment Cervical / Trunk Assessment: Kyphotic   Communication Communication Communication: No difficulties   Cognition Arousal/Alertness: Awake/alert Behavior During Therapy: WFL for tasks assessed/performed Overall Cognitive Status: Impaired/Different from baseline Area of Impairment: Problem solving                             Problem Solving: Slow processing General Comments: pt losing track of thought at times     General Comments  tachycardia up tom142 observed. Pt reports SOB with mobility although sats stable while on 8L HFNC. Pt also reporting dizziness and lightheadedness with mobility at this time, BP stable on A-line    Exercises     Shoulder Instructions      Home Living Family/patient expects to be discharged to:: Private residence Living Arrangements: Alone Available Help at Discharge: Family (daughters) Type of Home: House Home Access: Stairs to enter Technical brewer of Steps: 1 Entrance Stairs-Rails: None Home Layout: One level     Bathroom Shower/Tub: Occupational psychologist: Standard     Home Equipment: Air cabin crew (4 wheels)          Prior Functioning/Environment Prior Level of Function : Independent/Modified Independent             Mobility Comments: Uses rollator ADLs Comments: Performs ADLs and light IADLs. Has someone who assists with cook and cleaning and driving.        OT Problem List: Decreased strength;Decreased range of motion;Decreased activity tolerance;Impaired balance (sitting and/or standing);Decreased knowledge of use of DME or AE;Decreased knowledge of precautions      OT Treatment/Interventions: Self-care/ADL training;Therapeutic exercise;Energy conservation;DME and/or AE instruction;Therapeutic  activities;Patient/family education    OT Goals(Current goals can be found in the care plan section) Acute Rehab OT Goals Patient Stated Goal: Get stronger OT Goal Formulation: With patient Time For Goal Achievement: 03/29/21 Potential to Achieve Goals: Good  OT Frequency: Min 2X/week    Co-evaluation PT/OT/SLP Co-Evaluation/Treatment: Yes Reason for Co-Treatment: For patient/therapist safety;To address functional/ADL transfers PT goals addressed during session: Mobility/safety with mobility;Balance;Strengthening/ROM OT goals addressed during session: ADL's and self-care      AM-PAC OT "6 Clicks" Daily Activity     Outcome Measure Help from another person eating meals?: A Little Help from another person taking care of personal grooming?: A Little Help from another person toileting, which includes using toliet, bedpan, or urinal?: A Lot Help from another person bathing (including washing, rinsing, drying)?: A Lot Help from another person to put on and taking off regular upper body clothing?: A Lot Help from another person to put on and taking off regular lower body clothing?: A Lot 6 Click Score: 14   End of Session Equipment Utilized During Treatment: Oxygen Nurse Communication: Mobility status  Activity Tolerance: Patient limited by fatigue Patient left: in bed;with call bell/phone within reach;with nursing/sitter in room;with family/visitor present  OT Visit Diagnosis: Unsteadiness on feet (R26.81);Other abnormalities of gait and mobility (R26.89);Muscle weakness (generalized) (M62.81)                Time: 8144-8185 OT Time Calculation (  min): 29 min Charges:  OT General Charges $OT Visit: 1 Visit OT Evaluation $OT Eval Moderate Complexity: [place], OTR/L Acute Rehab Pager: [phone] Office: [place] 03/15/2021, 5:22 PM

## 2021-03-15 NOTE — Progress Notes (Signed)
ANTICOAGULATION CONSULT NOTE  Pharmacy Consult for IV heparin Indication: chest pain/ACS  Allergies  Allergen Reactions   Plendil [Felodipine]    Aspirin Other (See Comments)    Stomach upset   Barbiturates Other (See Comments)    Poorly tolerated   Eggs Or Egg-Derived Products Other (See Comments)    diarrhea   Epinephrine Other (See Comments)    Fever    Erythromycin Base Nausea And Vomiting    All mycins   Felodipine Er Other (See Comments)    headache   Morphine And Related Other (See Comments)    Rash    Oxycodone Hcl Other (See Comments)    Heart race   Penicillins Other (See Comments)    Fever    Pravachol Other (See Comments)    Muscle ache   Soy Allergy Other (See Comments)    shaking   Sulfa Antibiotics Other (See Comments)    rash   Wheat Other (See Comments)    Stomach upset   Adhesive [Tape] Other (See Comments)    Rash     Patient Measurements: Height: 5\' 3"  (160 cm) Weight: 69.1 kg (152 lb 5.4 oz) IBW/kg (Calculated) : 52.4 Heparin Dosing Weight: 68.5kg  Vital Signs: Temp: 97.8 F (36.6 C) (01/18 2321) Temp Source: Oral (01/18 2321) BP: 97/66 (01/19 0300) Pulse Rate: 120 (01/19 0345)  Labs: Recent Labs    03/05/2021 2227 03/09/2021 0120 03/03/2021 0123 02/25/2021 0641 02/26/2021 0642 03/25/2021 1108 03/26/2021 1408 03/21/2021 1425 02/27/2021 0157 02/27/2021 0509 03/15/21 0531  HGB  --   --    < > 12.2  --   --  12.9  --   --  10.8* 10.2*  HCT  --   --    < > 38.2  --   --  38.0  --   --  34.2* 31.4*  PLT  --   --   --  339  --   --   --   --   --  270 225  APTT  --   --   --   --   --   --   --  140*  --   --   --   LABPROT 17.0*  --   --   --   --   --   --  20.2*  --   --   --   INR 1.4*  --   --   --   --   --   --  1.7*  --   --   --   HEPARINUNFRC  --   --   --   --    < >  --   --  0.71* 0.40 0.36 0.18*  CREATININE  --  1.74*  --  1.89*  --   --   --   --   --  1.85*  --   TROPONINIHS  --   --    < > 9,898*  --  9,911*  --  9,068*  --   --    --    < > = values in this interval not displayed.     Estimated Creatinine Clearance: 20 mL/min (A) (by C-G formula based on SCr of 1.85 mg/dL (H)).   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 PMH significant for atrial flutter on chronic anticoagulation with warfarin. Patient was scheduled to have left heart catherization on Wednesday 1/18 and was instructed to stop taking warfarin on Thursday 1/12 in preparation for cath. INR on admission 1.4. Pharmacy to dose IV heparin.   Currently on IV heparin at 850 units/hr. AM level is subtherapeutic. H/H and Plt stable.   Goal of Therapy:  Heparin level 0.3-0.7 units/ml Monitor platelets by anticoagulation protocol: Yes   Plan:  Increase heparin to 900 units/hr F/u 8 hr HL   Albertina Parr, PharmD., BCPS, BCCCP Clinical Pharmacist Please refer to [facility] for unit-specific pharmacist

## 2021-03-15 NOTE — Evaluation (Signed)
Physical Therapy Evaluation Patient Details Name: [name] MRN: [id number] DOB: 07-12-1931 Today's Date: 03/15/2021  History of Present Illness  88 yo female presenting to ED on 1/16 with chest pain. Admitted for NSTEMI.   PMH including severe AS, a fib, HTN, HLD, CKD, hypothroidism, and 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  Clinical Impression  Pt presents to PT with deficits in endurance, strength, power, functional mobility, gait. Pt fatigues quickly during session and requires physical assistance during all mobility. Pt reports feeling very weak and expresses she has little control over her body at this time. Pt will benefit from continued attempts at mobility to improve activity tolerance and to reduce falls risk. Pt demonstrates the potential to make significant functional gains with high intensity inpatient PT services, thus PT recommends AIR placement.     Recommendations for follow up therapy are one component of a multi-disciplinary discharge planning process, led by the attending physician.  Recommendations may be updated based on patient status, additional functional criteria and insurance authorization.  Follow Up Recommendations Acute inpatient rehab (3hours/day)    Assistance Recommended at Discharge Frequent or constant Supervision/Assistance  Patient can return home with the following  A lot of help with walking and/or transfers;A lot of help with bathing/dressing/bathroom;Help with stairs or ramp for entrance;Assist for transportation;Assistance with feeding;Assistance with cooking/housework    Equipment Recommendations  (TBD pending progress)  Recommendations for Other Services  Rehab consult    Functional Status Assessment Patient has had a recent decline in their functional status and demonstrates the ability to make significant improvements in function in a reasonable and predictable amount of time.     Precautions / Restrictions Precautions Precautions:  Fall Precaution Comments: a-line Restrictions Weight Bearing Restrictions: No      Mobility  Bed Mobility Overal bed mobility: Needs Assistance Bed Mobility: Rolling, Supine to Sit, Sit to Supine Rolling: Mod assist   Supine to sit: Mod assist, +2 for physical assistance, HOB elevated Sit to supine: Max assist, +2 for physical assistance        Transfers Overall transfer level: Needs assistance Equipment used: 2 person hand held assist Transfers: Sit to/from Stand Sit to Stand: Min assist, +2 physical assistance, From elevated surface                Ambulation/Gait Ambulation/Gait assistance:  (pt fatigues prior to initiation of gait)                Stairs            Wheelchair Mobility    Modified Rankin (Stroke Patients Only)       Balance Overall balance assessment: Needs assistance Sitting-balance support: Single extremity supported, Bilateral upper extremity supported, Feet supported Sitting balance-Leahy Scale: Poor Sitting balance - Comments: pt with UE support of bed throughout sitting   Standing balance support: Bilateral upper extremity supported Standing balance-Leahy Scale: Poor                               Pertinent Vitals/Pain Pain Assessment Pain Assessment: No/denies pain    Home Living Family/patient expects to be discharged to:: Private residence Living Arrangements: Alone Available Help at Discharge: Family (daughters) Type of Home: House Home Access: Stairs to enter Entrance Stairs-Rails: None Entrance Stairs-Number of Steps: 1   Home Layout: One level Home Equipment: Air cabin crew (4 wheels)      Prior Function Prior Level of Function : Independent/Modified Independent  Mobility Comments: Uses rollator ADLs Comments: Performs ADLs and light IADLs. Has someone who assists with cook and cleaning and driving.     Hand Dominance   Dominant Hand: Right    Extremity/Trunk  Assessment   Upper Extremity Assessment Upper Extremity Assessment: Generalized weakness    Lower Extremity Assessment Lower Extremity Assessment: Generalized weakness    Cervical / Trunk Assessment Cervical / Trunk Assessment: Kyphotic  Communication   Communication: No difficulties  Cognition Arousal/Alertness: Awake/alert Behavior During Therapy: WFL for tasks assessed/performed Overall Cognitive Status: Impaired/Different from baseline Area of Impairment: Problem solving                             Problem Solving: Slow processing General Comments: pt losing track of thought at times        General Comments General comments (skin integrity, edema, etc.): tachycardia up tom142 observed. Pt reports SOB with mobility although sats stable while on 8L HFNC. Pt also reporting dizziness and lightheadedness with mobility at this time, BP stable on A-line    Exercises     Assessment/Plan    PT Assessment Patient needs continued PT services  PT Problem List Decreased strength;Decreased activity tolerance;Decreased balance;Decreased mobility;Cardiopulmonary status limiting activity       PT Treatment Interventions DME instruction;Gait training;Stair training;Functional mobility training;Therapeutic activities;Therapeutic exercise;Balance training;Neuromuscular re-education;Patient/family education    PT Goals (Current goals can be found in the Care Plan section)  Acute Rehab PT Goals Patient Stated Goal: to return to independence PT Goal Formulation: With patient Time For Goal Achievement: 03/29/21 Potential to Achieve Goals: Good    Frequency Min 3X/week     Co-evaluation PT/OT/SLP Co-Evaluation/Treatment: Yes Reason for Co-Treatment: Complexity of the patient's impairments (multi-system involvement);To address functional/ADL transfers;For patient/therapist safety PT goals addressed during session: Mobility/safety with mobility;Balance;Strengthening/ROM          AM-PAC PT "6 Clicks" Mobility  Outcome Measure Help needed turning from your back to your side while in a flat bed without using bedrails?: A Lot Help needed moving from lying on your back to sitting on the side of a flat bed without using bedrails?: Total Help needed moving to and from a bed to a chair (including a wheelchair)?: Total Help needed standing up from a chair using your arms (e.g., wheelchair or bedside chair)?: Total Help needed to walk in hospital room?: Total Help needed climbing 3-5 steps with a railing? : Total 6 Click Score: 7    End of Session Equipment Utilized During Treatment: Oxygen Activity Tolerance: Patient limited by fatigue Patient left: in bed;with call bell/phone within reach;with bed alarm set;with family/visitor present Nurse Communication: Mobility status PT Visit Diagnosis: Other abnormalities of gait and mobility (R26.89);Muscle weakness (generalized) (M62.81)    Time: 8099-8338 PT Time Calculation (min) (ACUTE ONLY): 29 min   Charges:   PT Evaluation $PT Eval Moderate Complexity: 1 Mod          Zenaida Niece, PT, DPT Acute Rehabilitation Pager: [phone] Office [phone]   Zenaida Niece 03/15/2021, 4:25 PM

## 2021-03-15 NOTE — Progress Notes (Signed)
Chaplain Melvenia Beam had opportunity to make follow up visit with patient. Patient was asleep upon my arrival, which gave me opportunity to meet and visit with patient's "significant-other" of 42 years. Richard lives in [place], [place], but formerly lived in [place]. He shared the story of them meeting in the church choir and having a meaningful relationship of companionship ever since. He has been at her bed side nearly the entire time since her admission on Monday evening. He shared that he may have to return to [facility] on Saturday, but I was able to suggest an alternative to his having to go that would accommodate his needing to leave her side. He was grateful for the recommendation and will see if he can make the alternative arrangements which would allow him to stay here throughout her stay in the hospital. Delfino Lovett also shared that patient is his primary family, other than his older brother and his church family in [place]. He shared that he has always been well received by patient's daughter throughout their 62 year relationship. Patient was awakened during doctor visit. This then afforded the three of [place] time to pray per patient's request before leaving. Chaplain is available for further consultation per patient or family request.

## 2021-03-15 NOTE — Progress Notes (Addendum)
OT Cancellation Note  Patient Details Name: LAIANA FRATUS MRN: [id number] DOB: 10-Sep-1931   Cancelled Treatment:    Reason Eval/Treat Not Completed: Fatigue/lethargy limiting ability to participate (Pt reporting she is very tired. Will return as schedule allows.)  [place], OTR/L Acute Rehab Pager: [phone] Office: [phone] 03/15/2021, 10:56 AM

## 2021-03-15 NOTE — Progress Notes (Signed)
NAME:  [name], MRN:  [id number], DOB:  [birth year], LOS: 3 ADMISSION DATE:  03/15/2021, CONSULTATION DATE:  02/28/2021 REFERRING MD:  Aundra Dubin - HF, CHIEF COMPLAINT: Cardiogenic shock    History of Present Illness:  88 year old woman who was being evaluated in the outpatient setting for possible TAVR for severe AS. As part of this went for dental extraction x 2 on 1/15 (done under general anesthesia). Patient returned home from procedure and reported chest pressure. The following day (1/16) patient noted progressive pressure and associated SOB (for which she took SL NTG with some relief) as well as orthopnea and nausea.   In ED, WBC 15.1, LA 7.4, + trop, Echo w/ septal hypokinesis. Was to be admitted by cardiology. Initial diagnosed with NSTEMI felt to be 2/2 demand ischemia from surgical procedure, c/b severe symptomatic AS.  Oxygen and IV heparin initiated. Trop was noted to be climbing 1/17 (616 -> 9898). Lactate downtrended to 4.4, BNP 1091; however, WBC uptrended to 22.7. Medical service consulted for abx and management of possible sepsis; vanc and cefepime initiated and fluid resuscitated. Heart failure team consulted. Given progressive hypotension, PCCM consulted for assistance with management.  Pertinent Medical History:  Chronic AF/flutter, HTN, severe Aortic stenosis (was being evaluated by cards for possile TAVR) CKD, hypothyroidism, leiomyosarcoma 2003, blindness for macular degeneration  DNR status w/ MOST form   Significant Hospital Events: Including procedures, antibiotic start and stop dates in addition to other pertinent events   1/16 admitted w/ SOB, weakness and orthopnea 1 day after dental extraction. + trop I admitted w/ working dx NSTEMI and acute AS. Also + lactic acidosis  1/17 WBC rising, lactate improved w/ IVFs. Started on vanc and cefepime. CCM consulted for progressive hypotension over course of day. Advanced HF also consulted.  1/18 WBC continues to rise (29 from  22), Hgb stable. CVP 8. Lasix gtt increased to 8. Net -882mL/24H. K 3.2, repleted. Multiple attempts at L radial A-line, unsuccessful. Remains on NE at 20, milrinone 0.375. Co-ox 73%. Preserving R radial for eventual cath. Cr stable. R axillary A-line placed. 1/19 Pressor requirements continue to increase, NE 24/vaso 0.03 today. Continues on milrinone 0.375 with Co-ox 59%. CVP 10. WBC improved, 23 (29). Hgb stable 10.2. Cr slightly improved (1.5 from 1.8). Slight bump in transaminases.  Interim History / Subjective:  Feeling/looking more fatigued today Did not sleep well overnight C/o L hip pain, sciatica (baseline, but exacerbated by laying in bed) Persistent high pressor requirement (NE 24, vaso 0.03) Continues on milrinone, CO-ox 59% Cr slightly improved, good UOP with Lasix gtt K low 2.6, repleted Mild transaminitis  Objective   Blood pressure 97/66, pulse (!) 120, temperature 97.8 F (36.6 C), temperature source Oral, resp. rate (!) 21, height 5\' 3"  (1.6 m), weight 68.8 kg, SpO2 96 %. CVP:  [7 mmHg-10 mmHg] 9 mmHg      Intake/Output Summary (Last 24 hours) at 03/15/2021 0740 Last data filed at 03/15/2021 1017 Gross per 24 hour  Intake 2826.43 ml  Output 2500 ml  Net 326.43 ml    Filed Weights   03/05/2021 0000 03/10/2021 0419 03/15/21 0500  Weight: 69.1 kg 69.1 kg 68.8 kg   Physical Examination: General: Chronically ill-appearing elderly woman in NAD, appears fatigued. HEENT: [place]/AT, anicteric sclera, PERRL, dry mucous membranes. Neuro: Awake, oriented x 4. Slightly more drowsy than prior. Responds to verbal stimuli. Following commands consistently. Moves all 4 extremities spontaneously. Strength 4/5 in all 4 extremities.  CV: Tachycardic, harsh III/VI CDC  murmur.  PULM: Breathing even and unlabored on 10L HFNC. Lung fields grossly CTAB. GI: Soft, nontender, nondistended. Normoactive bowel sounds. Extremities: Bilateral symmetric 1+ LE edema noted. Skin: Warm/dry, no  rashes.  Resolved Hospital Problem List:     Assessment & Plan:  Cardiogenic w/ acute systolic HF, +/- septic shock  Known severe AS. Now w/ last EF 55-60%. Ddx: New NSTEMI resulting in acute Systolic HF or Given recent dental procedure and known poor dentition ? Sepsis 2/2 aspiration. ? Query mix of both. - HF team managing as primary - Goal MAP > 65 - Continue NE, titrating to goal MAP - Vasopressin added for additional support with intention to wean NE - Begin midodrine 5mg  TID - Continue milrinone, trend Co-ox - Continue cefepime/vanc - F/u finalized BCx - Trend WBC, PCT ok - Consideration of short-term support device if necessary  NSTEMI w/ Known severe AS - Cardiology following, appreciate assistance - Manage cardiogenic/septic shock as above - Cardiac monitoring - Continue ASA/heparin - Eventual LHC/RHC once hemodynamically stable to tolerate  Dyspnea w/ acute pulmonary edema vs aspiration PNA  PCXR w/worsening bilateral airspace disease. Favoring edema. - Continue O2 support with HFNC - Wean O2 for sat > 90% - Pulmonary hygiene - Diuresis as tolerated, on Lasix gtt - Intermittent CXR - Antibiotics as above  CAF/flutter - Continue heparin gtt - Cardiac monitoring  Acute on chronic renal failure superimposed on CKD stage III Baseline scr 1.4 now 1.89 (peak 2.09 1/17) - Trend BMP - Replete electrolytes as indicated - Monitor I&Os closely on Lasix gtt - Avoid nephrotoxic agents as able - Ensure adequate renal perfusion  Hypothyroidism  - Continue Synthroid  Left hip pain Chronic sciatica Chronic L hip pain for which patient takes Tylenol at home. - Scheduled APAP 650mg  Q6H  H/o Leiomyosarcoma  - S/p treatment (2004) outpatient f/u per usual surveillance protocol  DNR status - Noted  Best Practice: (right click and "Reselect all SmartList Selections" daily)   Diet/type: NPO DVT prophylaxis: systemic heparin GI prophylaxis: N/A Lines: Central  line Foley:  Yes, and it is still needed Code Status:  DNR Last date of multidisciplinary goals of care discussion [Per primary]  Critical care time: 14 minutes   Lestine Mount, PA-C [facility] 03/15/21 7:40 AM  Please see [email or web address] for pager details.  From 7A-7P if no response, please call [phone] After hours, please call ELink [phone]

## 2021-03-15 NOTE — Progress Notes (Signed)
Pt with LLE pain, unrelieved by acetaminophen.  PT with reported allergies to morphine and oxycodone.  Placed order for tramadol for now. Will follow response. Will monitor for development of allergic reaction as well.

## 2021-03-15 NOTE — Progress Notes (Addendum)
ANTICOAGULATION CONSULT NOTE  Pharmacy Consult for IV heparin Indication: chest pain/ACS  Allergies  Allergen Reactions   Plendil [Felodipine]    Aspirin Other (See Comments)    Stomach upset   Barbiturates Other (See Comments)    Poorly tolerated   Eggs Or Egg-Derived Products Other (See Comments)    diarrhea   Epinephrine Other (See Comments)    Fever    Erythromycin Base Nausea And Vomiting    All mycins   Felodipine Er Other (See Comments)    headache   Morphine And Related Other (See Comments)    Rash    Oxycodone Hcl Other (See Comments)    Heart race   Penicillins Other (See Comments)    Fever    Pravachol Other (See Comments)    Muscle ache   Soy Allergy Other (See Comments)    shaking   Sulfa Antibiotics Other (See Comments)    rash   Wheat Other (See Comments)    Stomach upset   Adhesive [Tape] Other (See Comments)    Rash     Patient Measurements: Height: 5\' 3"  (160 cm) Weight: 68.8 kg (151 lb 10.8 oz) IBW/kg (Calculated) : 52.4 Heparin Dosing Weight: 68.5kg  Vital Signs: Temp: 98.6 F (37 C) (01/19 1131) Temp Source: Axillary (01/19 1131) BP: 107/58 (01/19 1419) Pulse Rate: 101 (01/19 1419)  Labs: Recent Labs    03/17/2021 2227 03/27/2021 0120 03/27/2021 0641 03/22/2021 0642 03/22/2021 1108 03/01/2021 1408 03/08/2021 1425 03/05/2021 0157 03/25/2021 0509 03/15/21 0531 03/15/21 1645  HGB  --    < > 12.2  --   --  12.9  --   --  10.8* 10.2*  --   HCT  --    < > 38.2  --   --  38.0  --   --  34.2* 31.4*  --   PLT  --   --  339  --   --   --   --   --  270 225  --   APTT  --   --   --   --   --   --  140*  --   --   --   --   LABPROT 17.0*  --   --   --   --   --  20.2*  --   --   --   --   INR 1.4*  --   --   --   --   --  1.7*  --   --   --   --   HEPARINUNFRC  --   --   --    < >  --   --  0.71*   < > 0.36 0.18* 0.28*  CREATININE  --    < > 1.89*  --   --   --   --   --  1.85* 1.54*  --   TROPONINIHS  --    < > 9,898*  --  9,911*  --  9,068*  --    --   --   --    < > = values in this interval not displayed.     Estimated Creatinine Clearance: 24 mL/min (A) (by C-G formula based on SCr of 1.54 mg/dL (H)).   Medical History: Past Medical History:  Diagnosis Date   Atrial fibrillation ([place])    Blood transfusion    " no reaction to transfusion"   Cancer ([place])    Complication of anesthesia  sensitive to drugs   Dysrhythmia    atrial flutter   Headache(784.0)    Hyperlipidemia    Hypertension    Hypothyroidism    Impaired fasting glucose    Kidney disease, chronic, stage IV (GFR 15-29 ml/min) (HCC)    Leiomyosarcoma of uterus (HCC)    Chemotherapy, Magrinat and Clarke-Pearson   Macular degeneration    Migraine headache    Improved after menopause   Mitral regurgitation 11/27/2012   Renal disorder    Shortness of breath    Shortness of breath    Cardiology Eval., possible diastolic dysfunctio   UTI (urinary tract infection)    Frequent   Visual loss, right eye    Left ocular accident   Vitamin D deficiency    Wrist fracture, right 12/2009   Assessment: 71 YOF presenting to ED for chest pain. PMH significant for atrial flutter on chronic anticoagulation with warfarin. Pharmacy to dose IV heparin.   Heparin level came back slightly subtherapeutic at 0.28, on 900 units/hr. No s/sx of bleeding or infusion issues - slightly oozing at access site but stable.   Goal of Therapy:  Heparin level 0.3-0.7 units/ml Monitor platelets by anticoagulation protocol: Yes   Plan:  Increase heparin to 1000 units/hr to get into goal range  Monitor daily HL, CBC, and for s/sx of bleeding   Antonietta Jewel, PharmD, [place] Pharmacist  Phone: [phone] 03/15/2021 5:34 PM  Please check AMION for all [place] phone numbers After 10:00 PM, call [place] [phone]

## 2021-03-15 NOTE — Progress Notes (Addendum)
°  PM Rounds  CVP 5-6 Personally checked. Cut lasix drip back to 5 mg per hour.   O2 sats in 90s. Cut back oxygen to 8 liters.   Having difficulty weaning pressors due to tachycardia. Currently A fib 120s.  Increase amio to 60 mg per hour for rate control.   Lanna Labella NP-C  3:28 PM

## 2021-03-15 NOTE — Progress Notes (Signed)
K+ 2.6  Replaced per protocol

## 2021-03-15 NOTE — Progress Notes (Addendum)
Advanced Heart Failure Rounding Note  PCP-Cardiologist: Candee Furbish, MD   Subjective:    1/17 Cardio/septic shock. Started on norepi + milrinone + lasix drip. Initial CO-OX 29%.  Placed on IV antibiotics.  1/18 Norepi requirements increased, continued on milrinone 0.375 mcg  and vasopressin was added.  A line placed.   Currently on vaso 0.03 units, norepi 24 mcg, and milrinone 0.375 mcg.   Oxygen down to 11 liters HFNC.   Creatinine 1.85>1.5   CVP 10-11 CO-OX 59% WBC-22>29 >>23  Bld Cx-NGTD   Poor appetite. Didn't sleep well. Denies SOB   Objective:   Weight Range: 68.8 kg Body mass index is 26.87 kg/m.   Vital Signs:   Temp:  [97.7 F (36.5 C)-98.4 F (36.9 C)] 97.7 F (36.5 C) (01/19 0700) Pulse Rate:  [99-137] 120 (01/19 0345) Resp:  [17-44] 21 (01/19 0345) BP: (64-108)/(49-74) 97/66 (01/19 0300) SpO2:  [89 %-99 %] 96 % (01/19 0345) Arterial Line BP: (87-123)/(48-72) 112/63 (01/19 0345) Weight:  [68.8 kg] 68.8 kg (01/19 0500) Last BM Date:  (PTA)  Weight change: Filed Weights   02/25/2021 0000 03/07/2021 0419 03/15/21 0500  Weight: 69.1 kg 69.1 kg 68.8 kg    Intake/Output:   Intake/Output Summary (Last 24 hours) at 03/15/2021 0804 Last data filed at 03/15/2021 8416 Gross per 24 hour  Intake 2723 ml  Output 2400 ml  Net 323 ml      Physical Exam   CVP 10-11  General: In bed. No resp difficulty. L subclavian central line.  HEENT: normal Neck: supple. JVP 10-11. Carotids 2+ bilat; no bruits. No lymphadenopathy or thryomegaly appreciated. Cor: PMI nondisplaced. Irregular rate & rhythm. No rubs, gallops or murmurs. Lungs: clear Abdomen: soft, nontender, nondistended. No hepatosplenomegaly. No bruits or masses. Good bowel sounds. Extremities: no cyanosis, clubbing, rash, edema. RUE a line.  Neuro: alert & orientedx3, cranial nerves grossly intact. moves all 4 extremities w/o difficulty. Affect flat     Telemetry  A fib 110-120s  EKG   A fib 113  bpm personally reviewed.  Labs    CBC Recent Labs    03/11/2021 2029 02/27/2021 0302 02/25/2021 0509 03/15/21 0531  WBC 15.3*   < > 29.3* 23.3*  NEUTROABS 14.3*  --   --   --   HGB 12.0   < > 10.8* 10.2*  HCT 39.5   < > 34.2* 31.4*  MCV 96.3   < > 93.4 90.8  PLT 356   < > 270 225   < > = values in this interval not displayed.   Basic Metabolic Panel Recent Labs    03/19/2021 0123 03/04/2021 0302 03/26/2021 0509 03/15/21 0531  NA  --    < > 138 131*  K  --    < > 3.2* 2.6*  CL  --    < > 101 93*  CO2  --    < > 24 27  GLUCOSE  --    < > 154* 173*  BUN  --    < > 42* 34*  CREATININE  --    < > 1.85* 1.54*  CALCIUM  --    < > 8.1* 7.1*  MG 1.9  --   --  2.5*   < > = values in this interval not displayed.   Liver Function Tests Recent Labs    03/11/2021 2227 03/15/21 0531  AST 39 62*  ALT 20 66*  ALKPHOS 73 58  BILITOT 0.8 0.9  PROT  7.4 6.2*  ALBUMIN 4.0 3.0*   No results for input(s): LIPASE, AMYLASE in the last 72 hours. Cardiac Enzymes No results for input(s): CKTOTAL, CKMB, CKMBINDEX, TROPONINI in the last 72 hours.  BNP: BNP (last 3 results) Recent Labs    03/10/2021 0123  BNP 1,091.5*    ProBNP (last 3 results) No results for input(s): PROBNP in the last 8760 hours.   D-Dimer No results for input(s): DDIMER in the last 72 hours. Hemoglobin A1C Recent Labs    03/08/2021 2227  HGBA1C 5.9*   Fasting Lipid Panel Recent Labs    03/23/2021 0509  CHOL 184  HDL 72  LDLCALC 90  TRIG 108  CHOLHDL 2.6   Thyroid Function Tests Recent Labs    03/06/2021 2227  TSH 0.390    Other results:   Imaging    No results found.   Medications:     Scheduled Medications:  aspirin EC  81 mg Oral Daily   atorvastatin  40 mg Oral Q2000   Chlorhexidine Gluconate Cloth  6 each Topical Daily   cholecalciferol  2,000 Units Oral Daily   levothyroxine  50 mcg Oral QODAY   levothyroxine  75 mcg Oral QODAY   lidocaine-prilocaine   Topical Once   mouth rinse  15 mL  Mouth Rinse BID    Infusions:  sodium chloride     sodium chloride     amiodarone 30 mg/hr (03/15/21 0625)   ceFEPime (MAXIPIME) IV Stopped (03/11/2021 1030)   furosemide (LASIX) 200 mg in dextrose 5% 100 mL (2mg /mL) infusion 6 mg/hr (03/03/2021 1700)   heparin 850 Units/hr (03/15/21 0625)   milrinone 0.375 mcg/kg/min (03/15/21 0625)   norepinephrine (LEVOPHED) Adult infusion 24 mcg/min (03/15/21 0625)   potassium chloride 10 mEq (03/15/21 0708)   vancomycin     vasopressin 0.03 Units/min (03/15/21 0625)    PRN Medications: Place/Maintain arterial line **AND** sodium chloride, acetaminophen, ondansetron (ZOFRAN) IV    Patient Profile  [name] is a 88 year old with a history of  severe AS, A fib/Aflutter, HTN, HLD, CKD, hypothyroidism, leimyosarcoma 2003, and 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  Also being worked up for TAVR.   Admitted with NSTEMI + shock cardiogenic/septic.   Assessment/Plan   NSTEMI -HS Trop 616>5006>9898.> 9068 -Plan for cath once recovered.  -On heparin drip . - Continue atorvastatin and aspirin.    2. Shock-->Sepsis + Cardiogenic  -? Had 2 teeth removed  03/02/2021  -Hypotensive. Lactic acid 7.4>4.4>3.4  -WBC 14>15>22>29.3 >>23K  - Procalcitonin 0.1>0.2> 0.4   -Blood cultures pending.  NGTD  -On vancomycin/cefepime.     3. Acute HFrEF--> possible cardiogenic shock  -Echo reviewed by Dr Aundra Dubin. EF dramatically different with EF down 25%.  RV moderately reduced. Possible Takotsubo. IVC dilated.  -Holding  BB -Initial CO-OX < 30%. Placed on norepi and milrinone and increased over the last 24 hours.  - Increased norepi to 24 mcg + milrinone 0.375 mcg and placed on vaso. Marland Kitchen CO-OX 59%.  - CVP  trending up 10-11. Increase lasix to 10 mg per hour. - Supp K.    3. Aortic Stenosis  -Being worked up for TAVR.    4.CKD Stage  Creatinine baseline 1.5-1.7 Admit creatinine 1.9, today 1.5 Follow daily BMET    5. Afib , chronic--> RVR - Developed RVR and  was started amio drip.  - Will continue to help with rate control.   -On heparin drip.     6. Acute Respiratory Failure -Placed HFNC  now at 11 liters. Start to wean  - Sats stable.   7. Blindness, macular degeneration    8.  Leiomyosarcoma  Completed treatment 2004.   9. GOC  DNR   Consult PT/dietitian. OOB.    Length of Stay: 3  Amy Clegg, NP  03/15/2021, 8:04 AM  Advanced Heart Failure Team Pager [phone] (M-F; 7a - 5p)  Please contact [facility] for night-coverage after hours (5p -7a ) and weekends on [email or web address]  Patient seen with NP, agree with the above note.    Good UOP yesterday, creatinine down to 1.54.  CVP 11-12 with co-ox 59%.  Patient remains on milrinone 0.375, NE 24, vasopressin 0.03 and Lasix gtt at 6 mg/hr.  She is still on 12 L HFNC.  K low at 2.6.    She remains on vancomycin/cefepime for ?PNA.   She remains in chronic AF on heparin gtt and amiodarone gtt for rate control.   She is much more alert/awake this morning.   General: NAD Neck: JVP 10-12 cm, no thyromegaly or thyroid nodule.  Lungs: Clear to auscultation bilaterally with normal respiratory effort. CV: Nondisplaced PMI.  Heart mildly tachy, irregular S1/S2, no S3/S4, 3/6 SEM RUSB with muffled S2.  No peripheral edema.   Abdomen: Soft, nontender, no hepatosplenomegaly, no distention.  Skin: Intact without lesions or rashes.  Neurologic: Alert and oriented x 3.  Psych: Normal affect. Extremities: No clubbing or cyanosis.  HEENT: Normal.   1. Shock: Concern for septic + cardiogenic etiology.  Occurred after extraction of infected teeth yesterday, ?odontogenic seeding of bloodstream.  Echo with LV EF 20-25% and moderate RVE/severe RV dysfunction; marked change from prior with biventricular failure and peri-apical akinesis of both the LV and RV, relative basal preservation; severe aortic stenosis, moderate-severe mitral regurgitation, dilated IVC. Biventricular failure could be a stress  (Takotsubo-type) cardiomyopathy in setting of sepsis versus large LAD MI (but no ST elevation and while HS-TnI is elevated, it is not as high as one might expect with a large occluded LAD). Lactate initially 7.4, has trended down to 1.4 yesterday.  Co-ox 59% this morning on norepinephrine 24 + milrinone 0.375 + vasopressin 0.03.  BP improved, CVP 11-12 on Lasix gtt 6 mg/hr. Still on significant oxygen.   - Continue milrinone 0.375.  - Wean vasopressin then NE as MAP allows.  Will add midodrine 5 mg tid.  - Vancomycin/cefepime started empirically.  - If needs mechanical support, can get IABP.  2. Acute systolic CHF: Echo this admission with LV EF 20-25% and moderate RVE/severe RV dysfunction; marked change from prior with biventricular failure and peri-apical akinesis of both the LV and RV, relative basal preservation; severe aortic stenosis, moderate-severe mitral regurgitation, dilated IVC. Marked change from 12/22.  As above, biventricular failure could be a stress (Takotsubo-type) cardiomyopathy in setting of sepsis versus large LAD MI (but no ST elevation and while HS-TnI is elevated, it is not as high as one might expect with a large occluded LAD). As above, CVP 11-12 with co-ox 59%. Creatinine improved at 1.54.  - Continue milrinone 0.375.  - Wean vasopressin then NE and add midodrine as above.  - Increase Lasix gtt to 10 mg/hr.  - Needs eventual cath (LHC/RHC) to sort out Takotsubo/stress CMP versus ischemic CMP.  No STEMI on ECG and no further chest pain, so would like to wait until she is more stable and creatinine has settled down before taking to cath lab. 3. CAD: NSTEMI with HS-TnI  [phone].  Troponin seems  high for stress cardiomyopathy but somewhat low for acute LAD occlusion (and no STE).  Could additionally be demand ischemia with underlying diffuse CAD and hypotensive event.  No chest pain since she initially came to the ER yesterday (just dyspnea).  Therefore, think reasonable  to hold off on LHC/RHC until she is more stable.  - Heparin gtt.  - ASA, statin.  - Plan LHC/RHC when more stable and creatinine trending down.  4. Aortic stenosis: Severe low flow/low gradient aortic stenosis.  Has started TAVR workup.  5. AKI on CKD stage 3: Baseline creatinine around 1.5.  2.09 at admission, 1.54 now.  - Support cardiac output and follow closely.  6. Mitral regurgitation: Moderate to severe on echo, likely functional.  May improve if she gets to TAVR in the future.  7. Atrial fibrillation: Chronic.  Rate in 100s for now.  - On heparin gtt.  - Continue amiodarone gtt 30 mg/hr for rate control.  8. Legally blind 9. ID: WBCs 23 but afebrile.  Concern for septic shock picture. ?Odontogenic seeding from dental extractions. ?PNA on CXR. PCT 0.3, not markedly high, but SVR low initially in 900s on pressor. Cultures NGTD.   - vancomycin/cefepime empirically.  10. Acute hypoxemic respiratory failure: She is on HFNC but CVP not markedly elevated (11-12).  CXR with patchy airspace disease, ?PNA.  - Continue diuresis, will increased Lasix gtt today.  - Continue abx.  - Repeat CXR today.  - CCM following.  11. Mobilize with PT.   CRITICAL CARE Performed by: Loralie Champagne  Total critical care time: 40 minutes  Critical care time was exclusive of separately billable procedures and treating other patients.  Critical care was necessary to treat or prevent imminent or life-threatening deterioration.  Critical care was time spent personally by me on the following activities: development of treatment plan with patient and/or surrogate as well as nursing, discussions with consultants, evaluation of patient's response to treatment, examination of patient, obtaining history from patient or surrogate, ordering and performing treatments and interventions, ordering and review of laboratory studies, ordering and review of radiographic studies, pulse oximetry and re-evaluation of patient's  condition.  Loralie Champagne 03/15/2021 8:34 AM

## 2021-03-16 ENCOUNTER — Inpatient Hospital Stay (HOSPITAL_COMMUNITY): Payer: Medicare Other

## 2021-03-16 ENCOUNTER — Encounter (HOSPITAL_COMMUNITY): Admission: EM | Disposition: E | Payer: Self-pay | Source: Home / Self Care | Attending: Cardiology

## 2021-03-16 DIAGNOSIS — I2510 Atherosclerotic heart disease of native coronary artery without angina pectoris: Secondary | ICD-10-CM

## 2021-03-16 DIAGNOSIS — I501 Left ventricular failure: Secondary | ICD-10-CM

## 2021-03-16 DIAGNOSIS — I340 Nonrheumatic mitral (valve) insufficiency: Secondary | ICD-10-CM

## 2021-03-16 DIAGNOSIS — N184 Chronic kidney disease, stage 4 (severe): Secondary | ICD-10-CM

## 2021-03-16 DIAGNOSIS — R570 Cardiogenic shock: Secondary | ICD-10-CM

## 2021-03-16 DIAGNOSIS — I350 Nonrheumatic aortic (valve) stenosis: Secondary | ICD-10-CM

## 2021-03-16 DIAGNOSIS — I509 Heart failure, unspecified: Secondary | ICD-10-CM

## 2021-03-16 DIAGNOSIS — I4811 Longstanding persistent atrial fibrillation: Secondary | ICD-10-CM

## 2021-03-16 HISTORY — PX: RIGHT HEART CATH AND CORONARY ANGIOGRAPHY: CATH118264

## 2021-03-16 LAB — CBC
HCT: 29.7 % — ABNORMAL LOW (ref 36.0–46.0)
Hemoglobin: 9.6 g/dL — ABNORMAL LOW (ref 12.0–15.0)
MCH: 29.8 pg (ref 26.0–34.0)
MCHC: 32.3 g/dL (ref 30.0–36.0)
MCV: 92.2 fL (ref 80.0–100.0)
Platelets: 211 10*3/uL (ref 150–400)
RBC: 3.22 MIL/uL — ABNORMAL LOW (ref 3.87–5.11)
RDW: 14.5 % (ref 11.5–15.5)
WBC: 22 10*3/uL — ABNORMAL HIGH (ref 4.0–10.5)
nRBC: 0.2 % (ref 0.0–0.2)

## 2021-03-16 LAB — COOXEMETRY PANEL
Carboxyhemoglobin: 0.8 % (ref 0.5–1.5)
Carboxyhemoglobin: 0.9 % (ref 0.5–1.5)
Methemoglobin: 0.9 % (ref 0.0–1.5)
Methemoglobin: 0.8 % (ref 0.0–1.5)
O2 Saturation: 55.1 %
O2 Saturation: 52.9 %
Total hemoglobin: 9.8 g/dL — ABNORMAL LOW (ref 12.0–16.0)
Total hemoglobin: 10 g/dL — ABNORMAL LOW (ref 12.0–16.0)

## 2021-03-16 LAB — BASIC METABOLIC PANEL
Anion gap: 11 (ref 5–15)
BUN: 26 mg/dL — ABNORMAL HIGH (ref 8–23)
CO2: 26 mmol/L (ref 22–32)
Calcium: 7.1 mg/dL — ABNORMAL LOW (ref 8.9–10.3)
Chloride: 94 mmol/L — ABNORMAL LOW (ref 98–111)
Creatinine, Ser: 1.6 mg/dL — ABNORMAL HIGH (ref 0.44–1.00)
GFR, Estimated: 31 mL/min — ABNORMAL LOW (ref >60)
Glucose, Bld: 177 mg/dL — ABNORMAL HIGH (ref 70–99)
Potassium: 3.9 mmol/L (ref 3.5–5.1)
Sodium: 131 mmol/L — ABNORMAL LOW (ref 135–145)

## 2021-03-16 LAB — HEPARIN LEVEL (UNFRACTIONATED): Heparin Unfractionated: 0.24 IU/mL — ABNORMAL LOW (ref 0.30–0.70)

## 2021-03-16 LAB — POCT I-STAT EG7
Acid-Base Excess: 1 mmol/L (ref 0.0–2.0)
Acid-Base Excess: 1 mmol/L (ref 0.0–2.0)
Bicarbonate: 25.5 mmol/L (ref 20.0–28.0)
Bicarbonate: 25.7 mmol/L (ref 20.0–28.0)
Calcium, Ion: 0.93 mmol/L — ABNORMAL LOW (ref 1.15–1.40)
Calcium, Ion: 0.96 mmol/L — ABNORMAL LOW (ref 1.15–1.40)
HCT: 28 % — ABNORMAL LOW (ref 36.0–46.0)
HCT: 28 % — ABNORMAL LOW (ref 36.0–46.0)
Hemoglobin: 9.5 g/dL — ABNORMAL LOW (ref 12.0–15.0)
Hemoglobin: 9.5 g/dL — ABNORMAL LOW (ref 12.0–15.0)
O2 Saturation: 49 %
O2 Saturation: 49 %
Potassium: 4.4 mmol/L (ref 3.5–5.1)
Potassium: 4.5 mmol/L (ref 3.5–5.1)
Sodium: 131 mmol/L — ABNORMAL LOW (ref 135–145)
Sodium: 132 mmol/L — ABNORMAL LOW (ref 135–145)
TCO2: 27 mmol/L (ref 22–32)
TCO2: 27 mmol/L (ref 22–32)
pCO2, Ven: 40 mmHg — ABNORMAL LOW (ref 44.0–60.0)
pCO2, Ven: 40.1 mmHg — ABNORMAL LOW (ref 44.0–60.0)
pH, Ven: 7.412 (ref 7.250–7.430)
pH, Ven: 7.415 (ref 7.250–7.430)
pO2, Ven: 26 mmHg — CL (ref 32.0–45.0)
pO2, Ven: 26 mmHg — CL (ref 32.0–45.0)

## 2021-03-16 LAB — BLOOD GAS, ARTERIAL
Acid-base deficit: 2.8 mmol/L — ABNORMAL HIGH (ref 0.0–2.0)
Bicarbonate: 20.3 mmol/L (ref 20.0–28.0)
FIO2: 52
O2 Saturation: 95.7 %
Patient temperature: 37.6
pCO2 arterial: 28.6 mmHg — ABNORMAL LOW (ref 32.0–48.0)
pH, Arterial: 7.467 — ABNORMAL HIGH (ref 7.350–7.450)
pO2, Arterial: 77 mmHg — ABNORMAL LOW (ref 83.0–108.0)

## 2021-03-16 LAB — GLUCOSE, CAPILLARY
Glucose-Capillary: 180 mg/dL — ABNORMAL HIGH (ref 70–99)
Glucose-Capillary: 160 mg/dL — ABNORMAL HIGH (ref 70–99)
Glucose-Capillary: 153 mg/dL — ABNORMAL HIGH (ref 70–99)
Glucose-Capillary: 237 mg/dL — ABNORMAL HIGH (ref 70–99)

## 2021-03-16 LAB — POCT ACTIVATED CLOTTING TIME: Activated Clotting Time: 143 seconds

## 2021-03-16 LAB — MAGNESIUM: Magnesium: 2.3 mg/dL (ref 1.7–2.4)

## 2021-03-16 SURGERY — RIGHT HEART CATH AND CORONARY ANGIOGRAPHY
Anesthesia: LOCAL

## 2021-03-16 MED ORDER — LIDOCAINE HCL (PF) 1 % IJ SOLN
INTRAMUSCULAR | Status: AC
  Filled 2021-03-16: qty 30

## 2021-03-16 MED ORDER — MIDAZOLAM HCL 2 MG/2ML IJ SOLN
INTRAMUSCULAR | Status: DC | PRN
  Administered 2021-03-16 (×2): .5 mg via INTRAVENOUS

## 2021-03-16 MED ORDER — SODIUM CHLORIDE 0.9 % IV SOLN
INTRAVENOUS | Status: AC | PRN
  Administered 2021-03-16: 10 mL/h via INTRAVENOUS

## 2021-03-16 MED ORDER — FAMOTIDINE IN NACL 20-0.9 MG/50ML-% IV SOLN
INTRAVENOUS | Status: AC
  Filled 2021-03-16: qty 50

## 2021-03-16 MED ORDER — HEPARIN (PORCINE) IN NACL 1000-0.9 UT/500ML-% IV SOLN
INTRAVENOUS | Status: AC
  Filled 2021-03-16: qty 1000

## 2021-03-16 MED ORDER — HEPARIN (PORCINE) 25000 UT/250ML-% IV SOLN
1150.0000 [IU]/h | INTRAVENOUS | Status: DC
  Administered 2021-03-17: 1150 [IU]/h via INTRAVENOUS
  Administered 2021-03-17: 1250 [IU]/h via INTRAVENOUS
  Filled 2021-03-16 (×2): qty 250

## 2021-03-16 MED ORDER — IOHEXOL 350 MG/ML SOLN
INTRAVENOUS | Status: DC | PRN
  Administered 2021-03-16: 70 mL

## 2021-03-16 MED ORDER — FENTANYL CITRATE (PF) 100 MCG/2ML IJ SOLN
INTRAMUSCULAR | Status: DC | PRN
  Administered 2021-03-16 (×2): 12.5 ug via INTRAVENOUS

## 2021-03-16 MED ORDER — POTASSIUM CHLORIDE 20 MEQ PO PACK
40.0000 meq | PACK | Freq: Once | ORAL | Status: DC
Start: 2021-03-16 — End: 2021-03-16
  Filled 2021-03-16: qty 2

## 2021-03-16 MED ORDER — POTASSIUM CHLORIDE 10 MEQ/50ML IV SOLN
10.0000 meq | INTRAVENOUS | Status: AC
  Administered 2021-03-16 (×4): 10 meq via INTRAVENOUS
  Filled 2021-03-16 (×4): qty 50

## 2021-03-16 MED ORDER — MIDODRINE HCL 5 MG PO TABS
10.0000 mg | ORAL_TABLET | Freq: Three times a day (TID) | ORAL | Status: DC
  Administered 2021-03-16 – 2021-03-17 (×3): 10 mg via ORAL
  Filled 2021-03-16 (×3): qty 2

## 2021-03-16 MED ORDER — METHYLPREDNISOLONE SODIUM SUCC 125 MG IJ SOLR
INTRAMUSCULAR | Status: DC | PRN
  Administered 2021-03-16: 125 mg via INTRAVENOUS

## 2021-03-16 MED ORDER — FENTANYL CITRATE (PF) 100 MCG/2ML IJ SOLN
INTRAMUSCULAR | Status: AC
  Filled 2021-03-16: qty 2

## 2021-03-16 MED ORDER — ONDANSETRON HCL 4 MG/2ML IJ SOLN
INTRAMUSCULAR | Status: DC | PRN
  Administered 2021-03-16: 4 mg via INTRAVENOUS

## 2021-03-16 MED ORDER — FAMOTIDINE IN NACL 20-0.9 MG/50ML-% IV SOLN
INTRAVENOUS | Status: AC | PRN
  Administered 2021-03-16: 20 mg via INTRAVENOUS

## 2021-03-16 MED ORDER — LIDOCAINE HCL (PF) 1 % IJ SOLN
INTRAMUSCULAR | Status: DC | PRN
  Administered 2021-03-16: 5 mL
  Administered 2021-03-16: 2 mL
  Administered 2021-03-16: 5 mL
  Administered 2021-03-16: 15 mL

## 2021-03-16 MED ORDER — BISACODYL 10 MG RE SUPP
10.0000 mg | Freq: Once | RECTAL | Status: AC
  Administered 2021-03-16: 10 mg via RECTAL
  Filled 2021-03-16: qty 1

## 2021-03-16 MED ORDER — HEPARIN (PORCINE) IN NACL 1000-0.9 UT/500ML-% IV SOLN
INTRAVENOUS | Status: DC | PRN
  Administered 2021-03-16 (×2): 500 mL

## 2021-03-16 MED ORDER — ONDANSETRON HCL 4 MG/2ML IJ SOLN
INTRAMUSCULAR | Status: AC
  Filled 2021-03-16: qty 2

## 2021-03-16 MED ORDER — METHYLPREDNISOLONE SODIUM SUCC 125 MG IJ SOLR
INTRAMUSCULAR | Status: AC
  Filled 2021-03-16: qty 2

## 2021-03-16 MED ORDER — MIDAZOLAM HCL 2 MG/2ML IJ SOLN
INTRAMUSCULAR | Status: AC
  Filled 2021-03-16: qty 2

## 2021-03-16 MED ORDER — ALUM & MAG HYDROXIDE-SIMETH 200-200-20 MG/5ML PO SUSP
15.0000 mL | ORAL | Status: DC | PRN
  Administered 2021-03-16: 15 mL via ORAL
  Filled 2021-03-16: qty 30

## 2021-03-16 SURGICAL SUPPLY — 17 items
CATH INFINITI 5FR ANG PIGTAIL (CATHETERS) ×1 IMPLANT
CATH INFINITI 5FR JL4 (CATHETERS) ×1 IMPLANT
CATH INFINITI JR4 5F (CATHETERS) ×1 IMPLANT
CATH SWAN GANZ 7F STRAIGHT (CATHETERS) ×1 IMPLANT
GUIDEWIRE .025 260CM (WIRE) ×1 IMPLANT
KIT HEART LEFT (KITS) ×4 IMPLANT
PACK CARDIAC CATHETERIZATION (CUSTOM PROCEDURE TRAY) ×4 IMPLANT
PROTECTION STATION PRESSURIZED (MISCELLANEOUS) ×2
SHEATH PINNACLE 5F 10CM (SHEATH) ×1 IMPLANT
SHEATH PINNACLE 7F 10CM (SHEATH) ×1 IMPLANT
SHEATH PROBE COVER 6X72 (BAG) ×1 IMPLANT
SLEEVE REPOSITIONING LENGTH 30 (MISCELLANEOUS) ×1 IMPLANT
STATION PROTECTION PRESSURIZED (MISCELLANEOUS) IMPLANT
SYR MEDRAD MARK 7 150ML (SYRINGE) ×1 IMPLANT
TRANSDUCER W/STOPCOCK (MISCELLANEOUS) ×4 IMPLANT
WIRE EMERALD 3MM-J .035X150CM (WIRE) ×1 IMPLANT
WIRE MICRO SET SILHO 5FR 7 (SHEATH) ×1 IMPLANT

## 2021-03-16 NOTE — Progress Notes (Addendum)
[place] VALVE TEAM  Patient Name: [name] Date of Encounter: 02/28/2021  Primary Cardiologist: Dr. Candee Furbish, MD  Hospital Problem List     Principal Problem:   Cardiogenic shock [facility]) Active Problems:   CKD (chronic kidney disease), stage IV (HCC)   Mitral regurgitation   Longstanding persistent atrial fibrillation (HCC)   Aortic stenosis   NSTEMI (non-ST elevated myocardial infarction) ([place])   Pressure injury of skin   Pulmonary edema with congestive heart failure with reduced left ventricular function ([place])   Subjective   Feels poorly today with complaints of nausea and constipation.  Inpatient Medications    Scheduled Meds:  acetaminophen  650 mg Oral Q6H   aspirin EC  81 mg Oral Daily   atorvastatin  40 mg Oral Q2000   Chlorhexidine Gluconate Cloth  6 each Topical Daily   cholecalciferol  2,000 Units Oral Daily   docusate sodium  100 mg Oral Daily   feeding supplement (KATE FARMS STANDARD 1.4)  325 mL Oral TID BM   levothyroxine  50 mcg Oral QODAY   levothyroxine  75 mcg Oral QODAY   lidocaine-prilocaine   Topical Once   mouth rinse  15 mL Mouth Rinse BID   midodrine  10 mg Oral TID WC   multivitamin with minerals  1 tablet Oral Daily   polyethylene glycol  17 g Oral Daily   Continuous Infusions:  sodium chloride     sodium chloride     amiodarone 60 mg/hr (02/25/2021 0824)   ceFEPime (MAXIPIME) IV Stopped (03/15/21 1142)   furosemide (LASIX) 200 mg in dextrose 5% 100 mL (2mg /mL) infusion 8 mg/hr (03/11/2021 0915)   heparin 1,000 Units/hr (03/01/2021 0725)   milrinone 0.375 mcg/kg/min (02/26/2021 0646)   norepinephrine (LEVOPHED) Adult infusion 28 mcg/min (03/15/2021 0721)   potassium chloride 10 mEq (03/01/2021 0843)   vancomycin Stopped (03/15/21 1424)   vasopressin 0.02 Units/min (03/17/2021 0646)   PRN Meds: Place/Maintain arterial line **AND** sodium chloride, alum & mag hydroxide-simeth, ondansetron  (ZOFRAN) IV   Vital Signs    Vitals:   03/06/2021 0500 03/21/2021 0600 03/08/2021 0757 03/26/2021 0826  BP:    (!) 77/60  Pulse: (!) 121 (!) 115  (!) 137  Resp: 19 (!) 24  (!) 22  Temp:   98.4 F (36.9 C)   TempSrc:   Oral   SpO2: 95% 96%  95%  Weight: 68.6 kg     Height:        Intake/Output Summary (Last 24 hours) at 03/08/2021 0915 Last data filed at 03/25/2021 0646 Gross per 24 hour  Intake 2132.25 ml  Output 1900 ml  Net 232.25 ml   Filed Weights   03/05/2021 0419 03/15/21 0500 03/19/2021 0500  Weight: 69.1 kg 68.8 kg 68.6 kg   Physical Exam   General: Ill appearing, NAD Neck: Negative for carotid bruits. + JVD Lungs: Decreased in bilateral bases. Breathing is unlabored. Cardiovascular: Irregularly irregular. Harsh systolic murmur at LSB Abdomen: Soft, non-tender, distended. No obvious abdominal masses. Extremities: No edema.  Neuro: Alert and oriented. No focal deficits. No facial asymmetry. MAE spontaneously. Psych: Responds to questions appropriately with normal affect.    Labs    CBC Recent Labs    03/15/21 0531 02/28/2021 0506  WBC 23.3* 22.0*  HGB 10.2* 9.6*  HCT 31.4* 29.7*  MCV 90.8 92.2  PLT 225 176   Basic Metabolic Panel Recent Labs    03/15/21 0531 03/15/21  1645 03/15/21 1856 03/03/2021 0506  NA 131*   < > 133* 131*  K 2.6*   < > 3.4* 3.9  CL 93*   < > 95* 94*  CO2 27   < > 24 26  GLUCOSE 173*   < > 161* 177*  BUN 34*   < > 27* 26*  CREATININE 1.54*   < > 1.45* 1.60*  CALCIUM 7.1*   < > 7.1* 7.1*  MG 2.5*  --   --  2.3   < > = values in this interval not displayed.   Liver Function Tests Recent Labs    03/15/21 0531  AST 62*  ALT 66*  ALKPHOS 58  BILITOT 0.9  PROT 6.2*  ALBUMIN 3.0*   No results for input(s): LIPASE, AMYLASE in the last 72 hours. Cardiac Enzymes No results for input(s): CKTOTAL, CKMB, CKMBINDEX, TROPONINI in the last 72 hours. BNP Invalid input(s): POCBNP D-Dimer No results for input(s): DDIMER in the last 72  hours. Hemoglobin A1C No results for input(s): HGBA1C in the last 72 hours. Fasting Lipid Panel Recent Labs    03/08/2021 0509  CHOL 184  HDL 72  LDLCALC 90  TRIG 108  CHOLHDL 2.6   Thyroid Function Tests No results for input(s): TSH, T4TOTAL, T3FREE, THYROIDAB in the last 72 hours.  Invalid input(s): FREET3  Telemetry    03/22/2021 Atrial fibrillation with rates in the 120-130 range - Personally Reviewed  ECG    No new tracing as of 03/09/2021 - Personally Reviewed  Radiology    DG CHEST PORT 1 VIEW  Result Date: 03/15/2021 CLINICAL DATA:  Shortness of breath and weakness with congestive heart failure. EXAM: PORTABLE CHEST 1 VIEW COMPARISON:  02/28/2021 FINDINGS: The patient is rotated to the left on today's radiograph, reducing diagnostic sensitivity and specificity. Atherosclerotic calcification of the aortic arch. Left central venous catheter tip: Right atrium. Peripheral tubing in the right arm appears to terminate in the right axilla. Borderline cardiomegaly. Continued bilateral interstitial accentuation. Continued obscuration of the left hemidiaphragm with mild blunting of the left lateral costophrenic angle. Mildly reduced conspicuity of the right costophrenic angle. IMPRESSION: 1. Continued appearance of interstitial accentuation with left basilar airspace opacity. Possible left pleural effusion. Borderline cardiomegaly. 2. There is hazy density partially obscuring the right hemidiaphragm, increased from previous, possibly from layering pleural effusion or early airspace opacity in the right lower lobe. 3.  Aortic Atherosclerosis (ICD10-I70.0). 4. Left central line tip: Right atrium. A small caliber tube projects over the right upper arm in terminates in the axilla. Electronically Signed   By: Van Clines M.D.   On: 03/15/2021 10:22    Cardiac Studies   Echocardiogram 02/27/2021:   1. Severely reduced LV function, EF 20-25%. WMA concerning for LAD  infarction vs stress  induced cardiomyopathy. LVOT VTI 7.0 cm which equates  to CO 1.8 L/min and 1.0 L/min/m2. Left ventricular ejection fraction, by  estimation, is 20 to 25%. The left  ventricle has severely decreased function. The left ventricle demonstrates  regional wall motion abnormalities (see scoring diagram/findings for  description). The left ventricular internal cavity size was mildly  dilated. Left ventricular diastolic  function could not be evaluated.   2. Severe low flow low gradient aortic stenosis is present. AS is likely  critical. Vmax 2.5 m/s, MG 16 mmHG, AVA 0.43 cm2, DI 0.19. SV index  10cc/m2. The aortic valve is calcified. Aortic valve regurgitation is not  visualized. Severe aortic valve  stenosis.  3. Right ventricular systolic function is severely reduced. The right  ventricular size is moderately enlarged. There is moderately elevated  pulmonary artery systolic pressure. The estimated right ventricular  systolic pressure is 44.0 mmHg.   4. Left atrial size was severely dilated.   5. Right atrial size was severely dilated.   6. The mitral valve is degenerative. Moderate to severe mitral valve  regurgitation.   7. Tricuspid valve regurgitation is moderate.   8. The inferior vena cava is dilated in size with <50% respiratory  variability, suggesting right atrial pressure of 15 mmHg.   Comparison(s): Changes from prior study are noted. EF is now reduced with  WMA. Concerns for low output. Critical AS.  Echocardiogram 02/12/21:   1. There is moderate-to-severe aortic stenosis with AVA 0.68cm2 by  continuity, mean gradient 74mmHg, peak gradient 58mmHg, Vmax 3.9 m/s, DI  0.23.   2. Left ventricular ejection fraction, by estimation, is 55 to 60%. The  left ventricle has normal function. The left ventricle has no regional  wall motion abnormalities. There is mild concentric left ventricular  hypertrophy. Diastolic function is  indeterminant due to atrial fibrillation.   3. Right  ventricular systolic function is normal. The right ventricular  size is normal. There is moderately elevated pulmonary artery systolic  pressure. The estimated right ventricular systolic pressure is 34.7 mmHg.   4. Left atrial size was severely dilated.   5. Right atrial size was severely dilated.   6. The mitral valve is degenerative. Mild mitral valve regurgitation.   7. The aortic valve is tricuspid. There is moderate calcification of the  aortic valve. There is severe thickening of the aortic valve. Aortic valve  regurgitation is trivial. Severe aortic valve stenosis.   8. The inferior vena cava is normal in size with <50% respiratory  variability, suggesting right atrial pressure of 8 mmHg.   Comparison(s): Compared to prior TTE in 01/2019, there is now  moderate-to-severe aortic stenosis as detailed above. Previously moderate.   Patient Profile     [name] is a 88 y.o. female with a PMH of severe AS (AVA 0.68, mGrad 32, Vmax 3.9, DI 0.23), atrial fibrillation/atrial flutter (on warfarin), HTN, HLD, CKD stage IIIb, hypothyroidism, and macular degeneration with blindness who presented to [facility]/03/2021 with acute chest pain after dental extraction (2) found to have an NSTEMI (peak HsT 4259) complicated by septic shock (sepsis versus cardiogenic), and acute HFrEF.   Assessment & Plan    Severe aortic stenosis: Referred OP to Dr. Ali Lowe for the evaluation of severe aortic stenosis with prior echo from 02/12/21 with mean gradient at 27mmHg, peak 55mmHg, and ASA at 0.68cm2. Repeat echo this admission 03/08/2021 with newly reduced LV function to 20-25% concerning for stress CM versus ischemic LAD infarct with severe low flow/low gradient AS with mean at 23mmHg, Vmax 2.9ms, AVA 0.43cm2, DI 0.19, and SVI at 10 concerning for critical AS. Plan was for cardiac cath however patient presented with cardiogenic/septic shock after dental extraction under general anesthesia. She is now requiring  vasopressor support with poor progress. AHF team managing and has asked SHT to see for possible BAV if no real improvement over the weekend. Unclear if she would tolerate at this time given unknown coronary status, uncontrolled AF. May need to first consider LHC to r/o critical CAD and if no severe/critical stenosis, consider BAV at that time until TAVR.   Septic versus cardiogenic shock: Started initially on norephinephrine, milrinone and lasix with increased requirements therefore  vasopressin added. BPs continue to be soft with uncontrolled HRs (AF) in the 120-130 range. Lactic acid 7.4 on admission>>>cleared to 1.4 today. WBC 14>15>22>29>23>22.  Continue with empiric antibiotics. PCCM consulted for further assistance.   Acute systolic CHF: Echocardiogram this admission with newly reduced LVEF to 20-25% with biventriculr failure and peri-apical akinesis of the RV/LV and severe low flow, low gradient aortic stenosis. Being diuresed with IV Lasix. AHF team added milrinone and low dose norepinephrine. CVP today 10-11; Co ox 55%. Continues on HFNC 8L. Will need R/LHC once more stable.   NSTEMI: HsT elevated with a peak at 9068. Unclear etiology as this seems too elevated for Takotsubo CM but felt to be low for acute LAD occlusion. No recurrent chest pain since admission. Will need further workup with cardiac catheterization for further coronary evaluation once more stable. Continue IV Heparin, ASA, statin.   Acute on chronic CKD stage III: Creatinine, 1.60 today. Baseline appears to be in the 1.5-2.0 range.   Leukocytosis: WBC elevated at 22 however not febrile. Blood and urine Cx sent. Started on Vanc/cefepime. Questionable odontogenic sepsis from dental extractions.   Chronic atrial fibrillation: Rates uncontrolled at this time in the 120-130 while on IV Amiodarone. Anticoagulated with IV Heparin.   Moderate to severe MR: Felt to be functional MR with possible improvement with BAV versus TAVR     Signed, Kathyrn Drown, NP  03/01/2021, 9:15 AM  Pager [phone]   ATTENDING ATTESTATION:  After conducting a review of all available clinical information with the care team, interviewing the patient, and performing a physical exam, I agree with the findings and plan described in this note.   GEN: Moderate distress Cardiac: Irregular and tachycardic, 3-6 systolic murmur Respiratory: Coarse bilaterally GI: Soft, nontender, non-distended  MS: No edema; No deformity. Neuro:  Nonfocal  Vasc:  +2 radial pulses, +2 femoral pulses, warm and well-perfused  Patient is an 88 year old female with moderate to severe aortic stenosis, atrial fibrillation, chronic kidney disease who is developed shock with likely cardiogenic and vasoplegic components.  The patient's ejection fraction demonstrates a new biventricular cardiomyopathy likely due to stress-induced cardiomyopathy.  She is being maintained on milrinone, norepinephrine, Lasix drip, and broad-spectrum antibiotics.  Of note her procalcitonin is not significantly elevated.  Creatinine is increasing to around 1.6.  Her co-ox is around 50.  I reviewed her echocardiogram which shows severe biventricular dysfunction with a highly calcified aortic valve.  I had a conversation with Dr. Benjamine Mola about potential treatment options.  I think we need more information.  We will refer her for coronary angiography and Swan-Ganz catheter placement.  The patient is quite elderly and frail.  I had a long conversation about how aggressive we should be.  Certainly a BAV plus or minus Impella support could be considered but it is unclear to me whether this is biventricular myopathic process would benefit from isolated aortic valve intervention with a BAV.  Certainly she had this degree of aortic stenosis with a normal ventricle prior to her decompensation so I feel at least at this point in time that if she can be bridged through her stress cardiomyopathy then things will  improve.  My suspicion for widespread and significant coronary artery disease is low but however if this is found I think this would certainly move [place] more towards conservative measures.  I had a long conversation with the patient her family.  We will obtain more information with the coronary angiography and right heart catheterization study which I  will review with Dr. Algernon Huxley; further recommendations will be informed by results of this testing.  60 minutes of critical care time was utilized to review all clinical data, conduct patient interview, examined the patient, and formulate our treatment plan.  Will check an ABG now.   Lenna Sciara, MD Pager [phone]

## 2021-03-16 NOTE — Progress Notes (Signed)
[place] for IV heparin Indication: chest pain/ACS  Allergies  Allergen Reactions   Plendil [Felodipine]    Aspirin Other (See Comments)    Stomach upset   Barbiturates Other (See Comments)    Poorly tolerated   Eggs Or Egg-Derived Products Other (See Comments)    diarrhea   Epinephrine Other (See Comments)    Fever    Erythromycin Base Nausea And Vomiting    All mycins   Felodipine Er Other (See Comments)    headache   Morphine And Related Other (See Comments)    Rash    Oxycodone Hcl Other (See Comments)    Heart race   Penicillins Other (See Comments)    Fever    Pravachol Other (See Comments)    Muscle ache   Soy Allergy Other (See Comments)    shaking   Sulfa Antibiotics Other (See Comments)    rash   Wheat Other (See Comments)    Stomach upset   Adhesive [Tape] Other (See Comments)    Rash     Patient Measurements: Height: 5\' 3"  (160 cm) Weight: 68.6 kg (151 lb 3.8 oz) IBW/kg (Calculated) : 52.4 Heparin Dosing Weight: 68.5kg  Vital Signs: Temp: 98 F (36.7 C) (01/20 1120) Temp Source: Oral (01/20 1120) BP: 90/73 (01/20 1200) Pulse Rate: 112 (01/20 1400)  Labs: Recent Labs    03/21/2021 0509 03/15/21 0531 03/15/21 1645 03/15/21 1856 03/15/2021 0506 03/05/2021 1654  HGB 10.8* 10.2*  --   --  9.6* 9.5*   9.5*  HCT 34.2* 31.4*  --   --  29.7* 28.0*   28.0*  PLT 270 225  --   --  211  --   HEPARINUNFRC 0.36 0.18* 0.28*  --  0.24*  --   CREATININE 1.85* 1.54* 1.57* 1.45* 1.60*  --      Estimated Creatinine Clearance: 23 mL/min (A) (by C-G formula based on SCr of 1.6 mg/dL (H)).   Medical History: Past Medical History:  Diagnosis Date   Atrial fibrillation ([place])    Blood transfusion    " no reaction to transfusion"   Cancer ([place])    Complication of anesthesia    sensitive to drugs   Dysrhythmia    atrial flutter   Headache(784.0)    Hyperlipidemia    Hypertension    Hypothyroidism    Impaired  fasting glucose    Kidney disease, chronic, stage IV (GFR 15-29 ml/min) (HCC)    Leiomyosarcoma of uterus (HCC)    Chemotherapy, Magrinat and Clarke-Pearson   Macular degeneration    Migraine headache    Improved after menopause   Mitral regurgitation 11/27/2012   Renal disorder    Shortness of breath    Shortness of breath    Cardiology Eval., possible diastolic dysfunctio   UTI (urinary tract infection)    Frequent   Visual loss, right eye    Left ocular accident   Vitamin D deficiency    Wrist fracture, right 12/2009   Assessment: 66 YOF presenting to ED for chest pain. PMH significant for atrial flutter on chronic anticoagulation with warfarin. Pharmacy to dose IV heparin.   She is now s/p cath with plans for possible impella placement followed by balloon aortic valvuloplasty then intervention on LAD +/- RCA on 1/23.  -Heparin to restart 8 hours post sheath removal -Heparin was at 1000 units/hr and heparin level was 0.24  Goal of Therapy:  Heparin level 0.3-0.7 units/ml Monitor platelets by anticoagulation protocol:  Yes   Plan:  -Restart heparin 1150 units/hr 8 hours after sheath removal -Heparin level in 8 hours and daily wth CBC daily  Hildred Laser, PharmD Clinical Pharmacist **Pharmacist phone directory can now be found on [email or web address] ([email or web address]).  Listed under [place].

## 2021-03-16 NOTE — Progress Notes (Addendum)
Patient ID: [name], female   DOB: 1931/11/11, 88 y.o.   MRN: [id number]     Advanced Heart Failure Rounding Note  PCP-Cardiologist: Candee Furbish, MD   Subjective:    1/17 Cardio/septic shock. Started on norepi + milrinone + lasix drip. Initial CO-OX 29%.  Placed on IV antibiotics.  1/18 Norepi requirements increased, continued on milrinone 0.375 mcg  and vasopressin was added.  A line placed.   Currently on vaso 0.03 units, norepi 28 mcg, and milrinone 0.375 mcg. Early am co-ox 55% with CVP 10-11 this morning, on Lasix gtt 5 mg/hr.   Oxygen down to 8 liters HFNC.   Creatinine 1.85>1.5>1.6   WBC-22>29 >23>22  Bld Cx-NGTD Afebrile, CXR with ?bibasilar infiltrates versus effusions.   Some nausea, denies dyspnea.   Objective:   Weight Range: 68.6 kg Body mass index is 26.79 kg/m.   Vital Signs:   Temp:  [97 F (36.1 C)-98.7 F (37.1 C)] 97 F (36.1 C) (01/20 0345) Pulse Rate:  [97-138] 115 (01/20 0600) Resp:  [16-24] 24 (01/20 0600) BP: (88-107)/(57-89) 98/68 (01/20 0400) SpO2:  [92 %-98 %] 96 % (01/20 0600) Arterial Line BP: (90-129)/(50-74) 102/60 (01/20 0600) Weight:  [68.6 kg] 68.6 kg (01/20 0500) Last BM Date:  (PTA)  Weight change: Filed Weights   03/12/2021 0419 03/15/21 0500 03/23/2021 0500  Weight: 69.1 kg 68.8 kg 68.6 kg    Intake/Output:   Intake/Output Summary (Last 24 hours) at 03/17/2021 0753 Last data filed at 03/19/2021 0646 Gross per 24 hour  Intake 2402.72 ml  Output 1900 ml  Net 502.72 ml      Physical Exam   CVP 10-11  General: NAD Neck: JVP 10 cm, no thyromegaly or thyroid nodule.  Lungs: Decreased at bases.  CV: Nondisplaced PMI.  Heart regular S1/S2, no S3/S4, 3/6 SEM RUSB with obscured S2.  No peripheral edema.   Abdomen: Soft, nontender, no hepatosplenomegaly, no distention.  Skin: Intact without lesions or rashes.  Neurologic: Alert and oriented x 3.  Psych: Normal affect. Extremities: No clubbing or cyanosis.  HEENT:  Normal.   Telemetry  A fib 110-120s  (personally reviewed)  Labs    CBC Recent Labs    03/15/21 0531 03/03/2021 0506  WBC 23.3* 22.0*  HGB 10.2* 9.6*  HCT 31.4* 29.7*  MCV 90.8 92.2  PLT 225 381   Basic Metabolic Panel Recent Labs    03/15/21 0531 03/15/21 1645 03/15/21 1856 03/17/2021 0506  NA 131*   < > 133* 131*  K 2.6*   < > 3.4* 3.9  CL 93*   < > 95* 94*  CO2 27   < > 24 26  GLUCOSE 173*   < > 161* 177*  BUN 34*   < > 27* 26*  CREATININE 1.54*   < > 1.45* 1.60*  CALCIUM 7.1*   < > 7.1* 7.1*  MG 2.5*  --   --  2.3   < > = values in this interval not displayed.   Liver Function Tests Recent Labs    03/15/21 0531  AST 62*  ALT 66*  ALKPHOS 58  BILITOT 0.9  PROT 6.2*  ALBUMIN 3.0*   No results for input(s): LIPASE, AMYLASE in the last 72 hours. Cardiac Enzymes No results for input(s): CKTOTAL, CKMB, CKMBINDEX, TROPONINI in the last 72 hours.  BNP: BNP (last 3 results) Recent Labs    03/21/2021 0123  BNP 1,091.5*    ProBNP (last 3 results) No results for input(s): PROBNP  in the last 8760 hours.   D-Dimer No results for input(s): DDIMER in the last 72 hours. Hemoglobin A1C No results for input(s): HGBA1C in the last 72 hours.  Fasting Lipid Panel Recent Labs    02/28/2021 0509  CHOL 184  HDL 72  LDLCALC 90  TRIG 108  CHOLHDL 2.6   Thyroid Function Tests No results for input(s): TSH, T4TOTAL, T3FREE, THYROIDAB in the last 72 hours.  Invalid input(s): FREET3   Other results:   Imaging    DG CHEST PORT 1 VIEW  Result Date: 03/15/2021 CLINICAL DATA:  Shortness of breath and weakness with congestive heart failure. EXAM: PORTABLE CHEST 1 VIEW COMPARISON:  03/23/2021 FINDINGS: The patient is rotated to the left on today's radiograph, reducing diagnostic sensitivity and specificity. Atherosclerotic calcification of the aortic arch. Left central venous catheter tip: Right atrium. Peripheral tubing in the right arm appears to terminate in the  right axilla. Borderline cardiomegaly. Continued bilateral interstitial accentuation. Continued obscuration of the left hemidiaphragm with mild blunting of the left lateral costophrenic angle. Mildly reduced conspicuity of the right costophrenic angle. IMPRESSION: 1. Continued appearance of interstitial accentuation with left basilar airspace opacity. Possible left pleural effusion. Borderline cardiomegaly. 2. There is hazy density partially obscuring the right hemidiaphragm, increased from previous, possibly from layering pleural effusion or early airspace opacity in the right lower lobe. 3.  Aortic Atherosclerosis (ICD10-I70.0). 4. Left central line tip: Right atrium. A small caliber tube projects over the right upper arm in terminates in the axilla. Electronically Signed   By: Van Clines M.D.   On: 03/15/2021 10:22     Medications:     Scheduled Medications:  acetaminophen  650 mg Oral Q6H   aspirin EC  81 mg Oral Daily   atorvastatin  40 mg Oral Q2000   Chlorhexidine Gluconate Cloth  6 each Topical Daily   cholecalciferol  2,000 Units Oral Daily   docusate sodium  100 mg Oral Daily   feeding supplement (KATE FARMS STANDARD 1.4)  325 mL Oral TID BM   levothyroxine  50 mcg Oral QODAY   levothyroxine  75 mcg Oral QODAY   lidocaine-prilocaine   Topical Once   mouth rinse  15 mL Mouth Rinse BID   midodrine  5 mg Oral TID WC   multivitamin with minerals  1 tablet Oral Daily   polyethylene glycol  17 g Oral Daily    Infusions:  sodium chloride     sodium chloride     amiodarone 60 mg/hr (03/02/2021 0646)   ceFEPime (MAXIPIME) IV Stopped (03/15/21 1142)   furosemide (LASIX) 200 mg in dextrose 5% 100 mL (2mg /mL) infusion 5 mg/hr (03/08/2021 0646)   heparin 1,000 Units/hr (03/19/2021 0725)   milrinone 0.375 mcg/kg/min (03/20/2021 0646)   norepinephrine (LEVOPHED) Adult infusion 28 mcg/min (03/07/2021 0721)   vancomycin Stopped (03/15/21 1424)   vasopressin 0.02 Units/min (03/22/2021 0646)     PRN Medications: Place/Maintain arterial line **AND** sodium chloride, alum & mag hydroxide-simeth, ondansetron (ZOFRAN) IV    Patient Profile  Ms Catanese is a 88 year old with a history of  severe AS, A fib/Aflutter, HTN, HLD, CKD, hypothyroidism, leimyosarcoma 2003, and 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  Also being worked up for TAVR.   Admitted with NSTEMI + shock cardiogenic/septic.   Assessment/Plan   1. Shock: Concern for septic + cardiogenic etiology.  Occurred after extraction of infected teeth yesterday, ?odontogenic seeding of bloodstream.  Echo with LV EF 20-25% and moderate RVE/severe RV dysfunction; marked  change from prior with biventricular failure and peri-apical akinesis of both the LV and RV, relative basal preservation; severe aortic stenosis, moderate-severe mitral regurgitation, dilated IVC. Biventricular failure could be a stress (Takotsubo-type) cardiomyopathy in setting of sepsis versus large LAD MI (but no ST elevation and while HS-TnI is elevated, it is not as high as one might expect with a large occluded LAD), also RV severe dysfunction would indicate the possibility of RV infarction, meaning that if she has CAD, it is likely severe LAD and RCA disease. Lactate initially 7.4, last had trended to normal 1.4.  Co-ox 55% this morning on norepinephrine 28 + milrinone 0.375 + vasopressin 0.03.  MAP stable, CVP 10-11 on Lasix gtt 5 mg/hr. Still on significant oxygen.   - Continue milrinone 0.375.  - Wean NE as MAP allows.  Increase to midodrine to 10 mg tid.  - Vancomycin/cefepime started empirically.  - She is stable but not improving.  Mechanical support would be option, ?cross valve with Impella as not sure IABP would be that helpful. Also discussed attempt at BAV with Dr. Ali Lowe Monday if she is not improving.  2. Acute systolic CHF: Echo this admission with LV EF 20-25% and moderate RVE/severe RV dysfunction; marked change from prior with biventricular failure  and peri-apical akinesis of both the LV and RV, relative basal preservation; severe aortic stenosis, moderate-severe mitral regurgitation, dilated IVC. Marked change from 12/22.  As above, biventricular failure could be a stress (Takotsubo-type) cardiomyopathy in setting of sepsis versus large LAD MI (but no ST elevation and while HS-TnI is elevated, it is not as high as one might expect with a large occluded LAD). As above, CVP mildly higher today at 10-11 with co-ox 55%. Creatinine stable at 1.6  - Continue milrinone 0.375, will resend co-ox.  - Wean vasopressin then NE and increase midodrine as above.  - Increase Lasix gtt to 8 mg/hr today for gentle diuresis.  - Needs eventual cath (LHC/RHC) to sort out Takotsubo/stress CMP versus ischemic CMP.  No STEMI on ECG and no further chest pain. Talked with Dr. Ali Lowe her structural heart MD this morning, will tentatively plan cath this afternoon given stabilization of creatinine.  We are considering eventual BAV if unable to wean support. Discussed with patient, will keep NPO.  3. CAD: NSTEMI with HS-TnI  [phone].  Troponin seems high for stress cardiomyopathy but somewhat low for acute LAD occlusion (and no STE).  Could additionally be demand ischemia with underlying diffuse CAD and hypotensive event.  No chest pain since she initially came to the ER yesterday (just dyspnea).  Therefore, think reasonable to hold off on LHC/RHC until she is more stable.  - Heparin gtt.  - ASA, statin.  - Plan LHC/RHC when more stable and creatinine trending down.  4. Aortic stenosis: Severe low flow/low gradient aortic stenosis.  Has started TAVR workup.  - As above, consider BAV if not making improvement. Discussed with Dr. Ali Lowe.   5. AKI on CKD stage 3: Baseline creatinine around 1.5.  2.09 at admission, 1.6 now.  - Support cardiac output and follow closely.  6. Mitral regurgitation: Moderate to severe on echo, likely functional.  May improve if she gets  to TAVR in the future.  7. Atrial fibrillation: Chronic.  Rate in 110s for now.  - On heparin gtt.  - Continue amiodarone gtt 60 mg/hr for rate control.  - Try to wean NE.   8. Legally blind 9. ID: WBCs 22 but afebrile.  Concern for  septic shock picture. ?Odontogenic seeding from dental extractions. ?PNA on CXR. PCT 0.38, not markedly high, but SVR low initially in 900s on pressor. Cultures NGTD.   - vancomycin/cefepime empirically.  10. Acute hypoxemic respiratory failure: She is on HFNC but CVP not markedly elevated (10-11).  CXR with bibasilar airspace disease, ?PNA. Also possible pleural effusions.   - Continue diuresis, will increased Lasix gtt today.  - Continue abx.  - CT chest today to assess airspace disease, ?effusions.  Consider thoracentesis with CCM if significant effusions present.  - CCM following.  11. Mobilize with PT.   CRITICAL CARE Performed by: Loralie Champagne  Total critical care time: 40 minutes  Critical care time was exclusive of separately billable procedures and treating other patients.  Critical care was necessary to treat or prevent imminent or life-threatening deterioration.  Critical care was time spent personally by me on the following activities: development of treatment plan with patient and/or surrogate as well as nursing, discussions with consultants, evaluation of patient's response to treatment, examination of patient, obtaining history from patient or surrogate, ordering and performing treatments and interventions, ordering and review of laboratory studies, ordering and review of radiographic studies, pulse oximetry and re-evaluation of patient's condition.  Loralie Champagne 03/15/2021 7:53 AM

## 2021-03-16 NOTE — Progress Notes (Signed)
NAME:  [name], MRN:  [id number], DOB:  [birth year], LOS: 4 ADMISSION DATE:  03/08/2021, CONSULTATION DATE:  03/20/2021 REFERRING MD:  Aundra Dubin - HF, CHIEF COMPLAINT: Cardiogenic shock    History of Present Illness:  88 year old woman who was being evaluated in the outpatient setting for possible TAVR for severe AS. As part of this went for dental extraction x 2 on 1/15 (done under general anesthesia). Patient returned home from procedure and reported chest pressure. The following day (1/16) patient noted progressive pressure and associated SOB (for which she took SL NTG with some relief) as well as orthopnea and nausea.   In ED, WBC 15.1, LA 7.4, + trop, Echo w/ septal hypokinesis. Was to be admitted by cardiology. Initial diagnosed with NSTEMI felt to be 2/2 demand ischemia from surgical procedure, c/b severe symptomatic AS.  Oxygen and IV heparin initiated. Trop was noted to be climbing 1/17 (616 -> 9898). Lactate downtrended to 4.4, BNP 1091; however, WBC uptrended to 22.7. Medical service consulted for abx and management of possible sepsis; vanc and cefepime initiated and fluid resuscitated. Heart failure team consulted. Given progressive hypotension, PCCM consulted for assistance with management.  Pertinent Medical History:  Chronic AF/flutter, HTN, severe Aortic stenosis (was being evaluated by cards for possile TAVR) CKD, hypothyroidism, leiomyosarcoma 2003, blindness for macular degeneration  DNR status w/ MOST form   Significant Hospital Events: Including procedures, antibiotic start and stop dates in addition to other pertinent events   1/16 admitted w/ SOB, weakness and orthopnea 1 day after dental extraction. + trop I admitted w/ working dx NSTEMI and acute AS. Also + lactic acidosis  1/17 WBC rising, lactate improved w/ IVFs. Started on vanc and cefepime. CCM consulted for progressive hypotension over course of day. Advanced HF also consulted.  1/18 WBC continues to rise (29 from  22), Hgb stable. CVP 8. Lasix gtt increased to 8. Net -86mL/24H. K 3.2, repleted. Multiple attempts at L radial A-line, unsuccessful. Remains on NE at 20, milrinone 0.375. Co-ox 73%. Preserving R radial for eventual cath. Cr stable. R axillary A-line placed. 1/19 Pressor requirements continue to increase, NE 24/vaso 0.03 today. Continues on milrinone 0.375 with Co-ox 59%. CVP 10. WBC improved, 23 (29). Hgb stable 10.2. Cr slightly improved (1.5 from 1.8). Slight bump in transaminases. 1/20 Persistent high pressor requirements (NE 28, vaso 0.02) and milrinone 0.375. Co-ox 55%. CVP 10-11 on Lasix gtt 5mg /hr. Cr 1.6 (1.45). NPO for ?RHC/LHC today. Valve team evaluating for BAV (eventual TAVR).  Interim History / Subjective:  Slept better overnight and in better spirits Reports nausea, constipation today Ongoing high pressor requirements NPO for possible LHC/RHC today Bedside US to assess for pleural effusions, no significant pocket noted CT Chest pending Current eval by Valve team re: BAV  Objective   Blood pressure 98/68, pulse (!) 115, temperature (!) 97 F (36.1 C), temperature source Axillary, resp. rate (!) 24, height 5\' 3"  (1.6 m), weight 68.6 kg, SpO2 96 %. CVP:  [5 mmHg-20 mmHg] 14 mmHg      Intake/Output Summary (Last 24 hours) at 03/10/2021 0801 Last data filed at 03/08/2021 0646 Gross per 24 hour  Intake 2257.11 ml  Output 1900 ml  Net 357.11 ml    Filed Weights   03/07/2021 0419 03/15/21 0500 03/21/2021 0500  Weight: 69.1 kg 68.8 kg 68.6 kg   Physical Examination: General: Chronically ill-appearing elderly woman in NAD. HEENT: [place]/AT, anicteric sclera, PERRL, moist mucous membranes. Neuro: Awake, oriented x 4. Responds to verbal  stimuli. Following commands consistently. Moves all 4 extremities spontaneously.  CV: Irregularly irregular rhythm, harsh CDC murmur at baseline. PULM: Breathing even and unlabored on 8L HFNC. Lung fields with faint bibasilar crackles. GI: Soft,  nontender, mildly distended. Hypoactive bowel sounds. Extremities: Bilateral symmetric 1-2+ nonpitting LE edema noted. Skin: Warm/dry, no rashes.  Resolved Hospital Problem List:     Assessment & Plan:  Cardiogenic w/ acute systolic HF, +/- septic shock  Known severe AS. Now w/ last EF 55-60%. Ddx: New NSTEMI resulting in acute Systolic HF or Given recent dental procedure and known poor dentition ? Sepsis 2/2 aspiration. ? Query mix of both. - HF managing as primary - Goal MAP > 65 - Continue NE, vaso titrated to goal MAP - Continue midodrine 5mg  TID - Continue milrinone, trend Co-ox - LHC/RHC today, 1/20 - Consideration of short-term support device, if necessary - Ultimately needs TAVR; Valve team evaluating for possible BAV as temporizing measure - Cefepime/vanc - F/u finalized BCx - Trend WBC, no fevers  NSTEMI w/ Known severe AS - Cardiology following, appreciate assistance - Manage cardiogenic/septic shock as above - Cardiac monitoring - Continue ASA/heparin - LHC/RHC once hemodynamically stable to tolerate; possible 1/20  Dyspnea w/ acute pulmonary edema vs aspiration PNA  CXR w/worsening bilateral airspace disease. Favoring edema. - Continue O2 support with HFNC - Wean O2 for sat > 90% - Pulmonary hygiene - Diuresis as tolerated, on Lasix gtt - Intermittent CXR  - Antibiotics as above - Bedside US to assess for pocket for thoracentesis, no significant effusion noted for intervention  CAF/flutter - Continue heparin gtt - Cardiac monitoring  Acute on chronic renal failure superimposed on CKD stage III Baseline scr 1.4 now 1.89 (peak 2.09 1/17) - Trend BMP - Replete electrolytes as indicated - Monitor strict I&Os on Lasix gtt - Avoid nephrotoxic agents as able - Ensure adequate renal perfusion  Hypothyroidism  - Continue Synthroid  Left hip pain Chronic sciatica Chronic L hip pain for which patient takes Tylenol at home. - Scheduled APAP 650mg  Q6H -  Consider gabapentin or similar, though hesitant to utilize in geriatric population  H/o Leiomyosarcoma  - S/p treatment (2004) outpatient f/u per usual surveillance protocol  DNR status - Noted  Best Practice: (right click and "Reselect all SmartList Selections" daily)   Diet/type: NPO for LHC/RHC DVT prophylaxis: systemic heparin GI prophylaxis: N/A Lines: Central line Foley:  Yes, and it is still needed Code Status:  DNR Last date of multidisciplinary goals of care discussion [Per primary]  Critical care time: 36 minutes   Lestine Mount, PA-C [facility] 03/09/2021 8:01 AM  Please see [email or web address] for pager details.  From 7A-7P if no response, please call [phone] After hours, please call ELink [phone]

## 2021-03-16 NOTE — H&P (View-Only) (Signed)
Patient ID: [name], female   DOB: 03-18-1931, 88 y.o.   MRN: [id number]     Advanced Heart Failure Rounding Note  PCP-Cardiologist: Candee Furbish, MD   Subjective:    1/17 Cardio/septic shock. Started on norepi + milrinone + lasix drip. Initial CO-OX 29%.  Placed on IV antibiotics.  1/18 Norepi requirements increased, continued on milrinone 0.375 mcg  and vasopressin was added.  A line placed.   Currently on vaso 0.03 units, norepi 28 mcg, and milrinone 0.375 mcg. Early am co-ox 55% with CVP 10-11 this morning, on Lasix gtt 5 mg/hr.   Oxygen down to 8 liters HFNC.   Creatinine 1.85>1.5>1.6   WBC-22>29 >23>22  Bld Cx-NGTD Afebrile, CXR with ?bibasilar infiltrates versus effusions.   Some nausea, denies dyspnea.   Objective:   Weight Range: 68.6 kg Body mass index is 26.79 kg/m.   Vital Signs:   Temp:  [97 F (36.1 C)-98.7 F (37.1 C)] 97 F (36.1 C) (01/20 0345) Pulse Rate:  [97-138] 115 (01/20 0600) Resp:  [16-24] 24 (01/20 0600) BP: (88-107)/(57-89) 98/68 (01/20 0400) SpO2:  [92 %-98 %] 96 % (01/20 0600) Arterial Line BP: (90-129)/(50-74) 102/60 (01/20 0600) Weight:  [68.6 kg] 68.6 kg (01/20 0500) Last BM Date:  (PTA)  Weight change: Filed Weights   03/07/2021 0419 03/15/21 0500 03/17/2021 0500  Weight: 69.1 kg 68.8 kg 68.6 kg    Intake/Output:   Intake/Output Summary (Last 24 hours) at 03/15/2021 0753 Last data filed at 03/11/2021 0646 Gross per 24 hour  Intake 2402.72 ml  Output 1900 ml  Net 502.72 ml      Physical Exam   CVP 10-11  General: NAD Neck: JVP 10 cm, no thyromegaly or thyroid nodule.  Lungs: Decreased at bases.  CV: Nondisplaced PMI.  Heart regular S1/S2, no S3/S4, 3/6 SEM RUSB with obscured S2.  No peripheral edema.   Abdomen: Soft, nontender, no hepatosplenomegaly, no distention.  Skin: Intact without lesions or rashes.  Neurologic: Alert and oriented x 3.  Psych: Normal affect. Extremities: No clubbing or cyanosis.  HEENT:  Normal.   Telemetry  A fib 110-120s  (personally reviewed)  Labs    CBC Recent Labs    03/15/21 0531 03/24/2021 0506  WBC 23.3* 22.0*  HGB 10.2* 9.6*  HCT 31.4* 29.7*  MCV 90.8 92.2  PLT 225 469   Basic Metabolic Panel Recent Labs    03/15/21 0531 03/15/21 1645 03/15/21 1856 03/25/2021 0506  NA 131*   < > 133* 131*  K 2.6*   < > 3.4* 3.9  CL 93*   < > 95* 94*  CO2 27   < > 24 26  GLUCOSE 173*   < > 161* 177*  BUN 34*   < > 27* 26*  CREATININE 1.54*   < > 1.45* 1.60*  CALCIUM 7.1*   < > 7.1* 7.1*  MG 2.5*  --   --  2.3   < > = values in this interval not displayed.   Liver Function Tests Recent Labs    03/15/21 0531  AST 62*  ALT 66*  ALKPHOS 58  BILITOT 0.9  PROT 6.2*  ALBUMIN 3.0*   No results for input(s): LIPASE, AMYLASE in the last 72 hours. Cardiac Enzymes No results for input(s): CKTOTAL, CKMB, CKMBINDEX, TROPONINI in the last 72 hours.  BNP: BNP (last 3 results) Recent Labs    03/26/2021 0123  BNP 1,091.5*    ProBNP (last 3 results) No results for input(s): PROBNP  in the last 8760 hours.   D-Dimer No results for input(s): DDIMER in the last 72 hours. Hemoglobin A1C No results for input(s): HGBA1C in the last 72 hours.  Fasting Lipid Panel Recent Labs    03/11/2021 0509  CHOL 184  HDL 72  LDLCALC 90  TRIG 108  CHOLHDL 2.6   Thyroid Function Tests No results for input(s): TSH, T4TOTAL, T3FREE, THYROIDAB in the last 72 hours.  Invalid input(s): FREET3   Other results:   Imaging    DG CHEST PORT 1 VIEW  Result Date: 03/15/2021 CLINICAL DATA:  Shortness of breath and weakness with congestive heart failure. EXAM: PORTABLE CHEST 1 VIEW COMPARISON:  03/06/2021 FINDINGS: The patient is rotated to the left on today's radiograph, reducing diagnostic sensitivity and specificity. Atherosclerotic calcification of the aortic arch. Left central venous catheter tip: Right atrium. Peripheral tubing in the right arm appears to terminate in the  right axilla. Borderline cardiomegaly. Continued bilateral interstitial accentuation. Continued obscuration of the left hemidiaphragm with mild blunting of the left lateral costophrenic angle. Mildly reduced conspicuity of the right costophrenic angle. IMPRESSION: 1. Continued appearance of interstitial accentuation with left basilar airspace opacity. Possible left pleural effusion. Borderline cardiomegaly. 2. There is hazy density partially obscuring the right hemidiaphragm, increased from previous, possibly from layering pleural effusion or early airspace opacity in the right lower lobe. 3.  Aortic Atherosclerosis (ICD10-I70.0). 4. Left central line tip: Right atrium. A small caliber tube projects over the right upper arm in terminates in the axilla. Electronically Signed   By: Van Clines M.D.   On: 03/15/2021 10:22     Medications:     Scheduled Medications:  acetaminophen  650 mg Oral Q6H   aspirin EC  81 mg Oral Daily   atorvastatin  40 mg Oral Q2000   Chlorhexidine Gluconate Cloth  6 each Topical Daily   cholecalciferol  2,000 Units Oral Daily   docusate sodium  100 mg Oral Daily   feeding supplement (KATE FARMS STANDARD 1.4)  325 mL Oral TID BM   levothyroxine  50 mcg Oral QODAY   levothyroxine  75 mcg Oral QODAY   lidocaine-prilocaine   Topical Once   mouth rinse  15 mL Mouth Rinse BID   midodrine  5 mg Oral TID WC   multivitamin with minerals  1 tablet Oral Daily   polyethylene glycol  17 g Oral Daily    Infusions:  sodium chloride     sodium chloride     amiodarone 60 mg/hr (03/06/2021 0646)   ceFEPime (MAXIPIME) IV Stopped (03/15/21 1142)   furosemide (LASIX) 200 mg in dextrose 5% 100 mL (2mg /mL) infusion 5 mg/hr (03/11/2021 0646)   heparin 1,000 Units/hr (03/26/2021 0725)   milrinone 0.375 mcg/kg/min (03/20/2021 0646)   norepinephrine (LEVOPHED) Adult infusion 28 mcg/min (03/09/2021 0721)   vancomycin Stopped (03/15/21 1424)   vasopressin 0.02 Units/min (02/27/2021 0646)     PRN Medications: Place/Maintain arterial line **AND** sodium chloride, alum & mag hydroxide-simeth, ondansetron (ZOFRAN) IV    Patient Profile  Ms Brazel is a 88 year old with a history of  severe AS, A fib/Aflutter, HTN, HLD, CKD, hypothyroidism, leimyosarcoma 2003, and 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  Also being worked up for TAVR.   Admitted with NSTEMI + shock cardiogenic/septic.   Assessment/Plan   1. Shock: Concern for septic + cardiogenic etiology.  Occurred after extraction of infected teeth yesterday, ?odontogenic seeding of bloodstream.  Echo with LV EF 20-25% and moderate RVE/severe RV dysfunction; marked  change from prior with biventricular failure and peri-apical akinesis of both the LV and RV, relative basal preservation; severe aortic stenosis, moderate-severe mitral regurgitation, dilated IVC. Biventricular failure could be a stress (Takotsubo-type) cardiomyopathy in setting of sepsis versus large LAD MI (but no ST elevation and while HS-TnI is elevated, it is not as high as one might expect with a large occluded LAD). Lactate initially 7.4, last had trended to normal 1.4.  Co-ox 55% this morning on norepinephrine 28 + milrinone 0.375 + vasopressin 0.03.  MAP stable, CVP 10-11 on Lasix gtt 5 mg/hr. Still on significant oxygen.   - Continue milrinone 0.375.  - Wean NE as MAP allows.  Increase to midodrine to 10 mg tid.  - Vancomycin/cefepime started empirically.  - She is stable but not improving.  Mechanical support would be option, ?cross valve with Impella as not sure IABP would be that helpful. Also discussed attempt at BAV with Dr. Ali Lowe Monday if she is not improving.  2. Acute systolic CHF: Echo this admission with LV EF 20-25% and moderate RVE/severe RV dysfunction; marked change from prior with biventricular failure and peri-apical akinesis of both the LV and RV, relative basal preservation; severe aortic stenosis, moderate-severe mitral regurgitation, dilated  IVC. Marked change from 12/22.  As above, biventricular failure could be a stress (Takotsubo-type) cardiomyopathy in setting of sepsis versus large LAD MI (but no ST elevation and while HS-TnI is elevated, it is not as high as one might expect with a large occluded LAD). As above, CVP mildly higher today at 10-11 with co-ox 55%. Creatinine stable at 1.6  - Continue milrinone 0.375, will resend co-ox.  - Wean vasopressin then NE and increase midodrine as above.  - Increase Lasix gtt to 8 mg/hr today for gentle diuresis.  - Needs eventual cath (LHC/RHC) to sort out Takotsubo/stress CMP versus ischemic CMP.  No STEMI on ECG and no further chest pain. Talked with Dr. Ali Lowe her structural heart MD this morning, will tentatively plan cath this afternoon given stabilization of creatinine.  We are considering eventual BAV if unable to wean support. Discussed with patient, will keep NPO.  3. CAD: NSTEMI with HS-TnI  [phone].  Troponin seems high for stress cardiomyopathy but somewhat low for acute LAD occlusion (and no STE).  Could additionally be demand ischemia with underlying diffuse CAD and hypotensive event.  No chest pain since she initially came to the ER yesterday (just dyspnea).  Therefore, think reasonable to hold off on LHC/RHC until she is more stable.  - Heparin gtt.  - ASA, statin.  - Plan LHC/RHC when more stable and creatinine trending down.  4. Aortic stenosis: Severe low flow/low gradient aortic stenosis.  Has started TAVR workup.  - As above, consider BAV if not making improvement. Discussed with Dr. Ali Lowe.   5. AKI on CKD stage 3: Baseline creatinine around 1.5.  2.09 at admission, 1.6 now.  - Support cardiac output and follow closely.  6. Mitral regurgitation: Moderate to severe on echo, likely functional.  May improve if she gets to TAVR in the future.  7. Atrial fibrillation: Chronic.  Rate in 110s for now.  - On heparin gtt.  - Continue amiodarone gtt 60 mg/hr for rate  control.  - Try to wean NE.   8. Legally blind 9. ID: WBCs 22 but afebrile.  Concern for septic shock picture. ?Odontogenic seeding from dental extractions. ?PNA on CXR. PCT 0.38, not markedly high, but SVR low initially in 900s on pressor. Cultures  NGTD.   - vancomycin/cefepime empirically.  10. Acute hypoxemic respiratory failure: She is on HFNC but CVP not markedly elevated (10-11).  CXR with bibasilar airspace disease, ?PNA. Also possible pleural effusions.   - Continue diuresis, will increased Lasix gtt today.  - Continue abx.  - CT chest today to assess airspace disease, ?effusions.  Consider thoracentesis with CCM if significant effusions present.  - CCM following.  11. Mobilize with PT.   CRITICAL CARE Performed by: Loralie Champagne  Total critical care time: 40 minutes  Critical care time was exclusive of separately billable procedures and treating other patients.  Critical care was necessary to treat or prevent imminent or life-threatening deterioration.  Critical care was time spent personally by me on the following activities: development of treatment plan with patient and/or surrogate as well as nursing, discussions with consultants, evaluation of patient's response to treatment, examination of patient, obtaining history from patient or surrogate, ordering and performing treatments and interventions, ordering and review of laboratory studies, ordering and review of radiographic studies, pulse oximetry and re-evaluation of patient's condition.  Loralie Champagne 03/15/2021 7:53 AM

## 2021-03-16 NOTE — Interval H&P Note (Signed)
History and Physical Interval Note:  03/15/2021 3:56 PM  [name]  has presented today for surgery, with the diagnosis of CAD.  The various methods of treatment have been discussed with the patient and family. After consideration of risks, benefits and other options for treatment, the patient has consented to  Procedure(s): RIGHT/LEFT HEART CATH AND CORONARY ANGIOGRAPHY (N/A) as a surgical intervention.  The patient's history has been reviewed, patient examined, no change in status, stable for surgery.  I have reviewed the patient's chart and labs.  Questions were answered to the patient's satisfaction.     Ryleah Miramontes [employer]

## 2021-03-16 NOTE — Progress Notes (Signed)
[place] for IV heparin Indication: chest pain/ACS  Allergies  Allergen Reactions   Plendil [Felodipine]    Aspirin Other (See Comments)    Stomach upset   Barbiturates Other (See Comments)    Poorly tolerated   Eggs Or Egg-Derived Products Other (See Comments)    diarrhea   Epinephrine Other (See Comments)    Fever    Erythromycin Base Nausea And Vomiting    All mycins   Felodipine Er Other (See Comments)    headache   Morphine And Related Other (See Comments)    Rash    Oxycodone Hcl Other (See Comments)    Heart race   Penicillins Other (See Comments)    Fever    Pravachol Other (See Comments)    Muscle ache   Soy Allergy Other (See Comments)    shaking   Sulfa Antibiotics Other (See Comments)    rash   Wheat Other (See Comments)    Stomach upset   Adhesive [Tape] Other (See Comments)    Rash     Patient Measurements: Height: 5\' 3"  (160 cm) Weight: 68.6 kg (151 lb 3.8 oz) IBW/kg (Calculated) : 52.4 Heparin Dosing Weight: 68.5kg  Vital Signs: Temp: 98 F (36.7 C) (01/20 1120) Temp Source: Oral (01/20 1120) BP: 90/73 (01/20 1200) Pulse Rate: 112 (01/20 1400)  Labs: Recent Labs    03/25/2021 0509 03/15/21 0531 03/15/21 1645 03/15/21 1856 03/21/2021 0506  HGB 10.8* 10.2*  --   --  9.6*  HCT 34.2* 31.4*  --   --  29.7*  PLT 270 225  --   --  211  HEPARINUNFRC 0.36 0.18* 0.28*  --  0.24*  CREATININE 1.85* 1.54* 1.57* 1.45* 1.60*     Estimated Creatinine Clearance: 23 mL/min (A) (by C-G formula based on SCr of 1.6 mg/dL (H)).   Medical History: Past Medical History:  Diagnosis Date   Atrial fibrillation ([place])    Blood transfusion    " no reaction to transfusion"   Cancer ([place])    Complication of anesthesia    sensitive to drugs   Dysrhythmia    atrial flutter   Headache(784.0)    Hyperlipidemia    Hypertension    Hypothyroidism    Impaired fasting glucose    Kidney disease, chronic, stage IV (GFR  15-29 ml/min) (HCC)    Leiomyosarcoma of uterus (HCC)    Chemotherapy, Magrinat and Clarke-Pearson   Macular degeneration    Migraine headache    Improved after menopause   Mitral regurgitation 11/27/2012   Renal disorder    Shortness of breath    Shortness of breath    Cardiology Eval., possible diastolic dysfunctio   UTI (urinary tract infection)    Frequent   Visual loss, right eye    Left ocular accident   Vitamin D deficiency    Wrist fracture, right 12/2009   Assessment: 41 YOF presenting to ED for chest pain. PMH significant for atrial flutter on chronic anticoagulation with warfarin. Pharmacy to dose IV heparin.   Heparin level came back slightly subtherapeutic at 0.24, on heparin drip 1000 uts/hr  No s/sx of bleeding or infusion issues - slightly oozing at access site but stable.   Goal of Therapy:  Heparin level 0.3-0.7 units/ml Monitor platelets by anticoagulation protocol: Yes   Plan:   heparin to 1000 units/hr  Monitor daily HL, CBC, and for s/sx of bleeding  Patient in cath lab - follow up restart heparin  Bonnita Nasuti Pharm.D. CPP, BCPS Clinical Pharmacist [phone] 03/09/2021 4:01 PM   Please check AMION for all [place] phone numbers After 10:00 PM, call [facility] [phone]

## 2021-03-17 DIAGNOSIS — Z515 Encounter for palliative care: Secondary | ICD-10-CM | POA: Diagnosis not present

## 2021-03-17 DIAGNOSIS — Z7189 Other specified counseling: Secondary | ICD-10-CM

## 2021-03-17 DIAGNOSIS — Z66 Do not resuscitate: Secondary | ICD-10-CM | POA: Diagnosis not present

## 2021-03-17 LAB — BASIC METABOLIC PANEL
Anion gap: 13 (ref 5–15)
BUN: 30 mg/dL — ABNORMAL HIGH (ref 8–23)
CO2: 19 mmol/L — ABNORMAL LOW (ref 22–32)
Calcium: 7 mg/dL — ABNORMAL LOW (ref 8.9–10.3)
Chloride: 97 mmol/L — ABNORMAL LOW (ref 98–111)
Creatinine, Ser: 2 mg/dL — ABNORMAL HIGH (ref 0.44–1.00)
GFR, Estimated: 24 mL/min — ABNORMAL LOW (ref >60)
Glucose, Bld: 196 mg/dL — ABNORMAL HIGH (ref 70–99)
Potassium: 3.9 mmol/L (ref 3.5–5.1)
Sodium: 129 mmol/L — ABNORMAL LOW (ref 135–145)

## 2021-03-17 LAB — COOXEMETRY PANEL
Carboxyhemoglobin: 1 % (ref 0.5–1.5)
Methemoglobin: 1 % (ref 0.0–1.5)
O2 Saturation: 34 %
Total hemoglobin: 8.1 g/dL — ABNORMAL LOW (ref 12.0–16.0)

## 2021-03-17 LAB — CBC
HCT: 25.4 % — ABNORMAL LOW (ref 36.0–46.0)
Hemoglobin: 8.3 g/dL — ABNORMAL LOW (ref 12.0–15.0)
MCH: 29.9 pg (ref 26.0–34.0)
MCHC: 32.7 g/dL (ref 30.0–36.0)
MCV: 91.4 fL (ref 80.0–100.0)
Platelets: 199 10*3/uL (ref 150–400)
RBC: 2.78 MIL/uL — ABNORMAL LOW (ref 3.87–5.11)
RDW: 14.7 % (ref 11.5–15.5)
WBC: 18.4 10*3/uL — ABNORMAL HIGH (ref 4.0–10.5)
nRBC: 0.4 % — ABNORMAL HIGH (ref 0.0–0.2)

## 2021-03-17 LAB — GLUCOSE, CAPILLARY
Glucose-Capillary: 164 mg/dL — ABNORMAL HIGH (ref 70–99)
Glucose-Capillary: 165 mg/dL — ABNORMAL HIGH (ref 70–99)
Glucose-Capillary: 174 mg/dL — ABNORMAL HIGH (ref 70–99)
Glucose-Capillary: 181 mg/dL — ABNORMAL HIGH (ref 70–99)
Glucose-Capillary: 181 mg/dL — ABNORMAL HIGH (ref 70–99)

## 2021-03-17 LAB — HEPARIN LEVEL (UNFRACTIONATED)
Heparin Unfractionated: 0.2 IU/mL — ABNORMAL LOW (ref 0.30–0.70)
Heparin Unfractionated: 0.52 IU/mL (ref 0.30–0.70)

## 2021-03-17 LAB — MAGNESIUM: Magnesium: 2.5 mg/dL — ABNORMAL HIGH (ref 1.7–2.4)

## 2021-03-17 MED ORDER — SODIUM CHLORIDE 0.9% FLUSH: 3.0000 mL | INTRAVENOUS | Status: DC | PRN

## 2021-03-17 MED ORDER — SODIUM CHLORIDE 0.9 % IV SOLN: 250.0000 mL | INTRAVENOUS | Status: DC | PRN

## 2021-03-17 MED ORDER — POLYVINYL ALCOHOL 1.4 % OP SOLN
1.0000 [drp] | OPHTHALMIC | Status: DC | PRN
  Filled 2021-03-17: qty 15

## 2021-03-17 MED ORDER — ONDANSETRON HCL 4 MG/2ML IJ SOLN: 4.0000 mg | Freq: Four times a day (QID) | INTRAMUSCULAR | Status: DC | PRN

## 2021-03-17 MED ORDER — POTASSIUM CHLORIDE 10 MEQ/50ML IV SOLN: 10.0000 meq | INTRAVENOUS | Status: DC

## 2021-03-17 MED ORDER — ACETAMINOPHEN 325 MG PO TABS: 650.0000 mg | ORAL_TABLET | ORAL | Status: DC | PRN

## 2021-03-17 MED ORDER — MIDODRINE HCL 5 MG PO TABS
15.0000 mg | ORAL_TABLET | Freq: Three times a day (TID) | ORAL | Status: DC
  Administered 2021-03-17 (×2): 15 mg via ORAL
  Filled 2021-03-17 (×3): qty 3

## 2021-03-17 MED ORDER — HYDRALAZINE HCL 20 MG/ML IJ SOLN: 10.0000 mg | INTRAMUSCULAR | Status: AC | PRN

## 2021-03-17 MED ORDER — LABETALOL HCL 5 MG/ML IV SOLN: 10.0000 mg | INTRAVENOUS | Status: AC | PRN

## 2021-03-17 MED ORDER — SODIUM CHLORIDE 0.9% FLUSH
3.0000 mL | Freq: Two times a day (BID) | INTRAVENOUS | Status: DC
  Administered 2021-03-17 – 2021-03-18 (×2): 3 mL via INTRAVENOUS

## 2021-03-17 NOTE — Consult Note (Signed)
Consultation Note Date: 03/17/2021   Patient Name: [name]  DOB: 1931/06/27  MRN: [id number]  Age / Sex: 88 y.o., female  PCP: Lavone Orn, MD Referring Physician: Larey Dresser, MD  Reason for Consultation: Establishing goals of care  HPI/Patient Profile: 88 y.o. female  with past medical history of severe AS, A fib/Aflutter, HTN, HLD, CKD, hypothyroidism, leiomyosarcoma, and blindness from macular degeneration admitted on 03/08/2021 with NSTEMI and shock. EF 20-25% and severe RV dysfunction, severe aortic stenosis, and moderate-severe mitral regurgitation. Remains on milrinone, levophed, and vasopressin infusions. Patient has told cardiology she is not interested in anymore procedures. PMT consulted to discuss plan moving forward.  Clinical Assessment and Goals of Care: I have reviewed medical records including EPIC notes, labs and imaging, received report from Dr. Haroldine Laws and RN, assessed the patient and then met with patient,  2 daughters, and sig other to discuss diagnosis prognosis, GOC, EOL wishes, disposition and options.  I introduced Palliative Medicine as specialized medical care for people living with serious illness. It focuses on providing relief from the symptoms and stress of a serious illness. The goal is to improve quality of life for both the patient and the family.  We discussed a brief life review of the patient. Patient tells me about working at [employer] and other jobs she had over the years. Tells me she grew up in [place] and moved to [place] as her first husband was in the [employer]. She tells me about her family in the room with her and how proud she is of them.    We discussed patient's current illness and what it means in the larger context of patient's on-going co-morbidities.  Natural disease trajectory and expectations at EOL were discussed. She shares about her conversation with Dr. Haroldine Laws earlier today.  She tells me she is not interested in any further procedures.   I attempted to elicit values and goals of care important to the patient.  She tells me what is most important to her now is to maintain alertness so that she may have conversations with some visitors - specifically a granddaughter that will be here this evening. She tells me she would like to have some special time with her family.  She tells me she will soon be ready to stop aggressive medical interventions such as vasopressors and she understands she will pass away. She believes she will be ready for this in a matter of days - once she completes important conversations. We discuss that we will use medications to ensure she is comfortable and then proceed with freeing her from medications prolonging her life. Discussed that she is high amount support and time could be quite short. She expresses understanding. She wants to be sure we do not give her medications to alter her mental status until she is ready. We discussed an expected hospital death.  All questions and concerns of patient and family were addressed. They were provided PMT contact information. We discussed a provider will follow up with them tomorrow to address any further concerns/assess situation.  Primary Decision Maker PATIENT Joined by 2 daughters  SUMMARY OF RECOMMENDATIONS   - planning to transition to comfort measures only and stop vasopressors in coming days - patient will tell us when she is ready but first wants to have some important conversations with family - she would like to avoid any medications to alter mental status until she has conversations with family - prioritize alertness - when she is ready  she wants to ensure she is comfortable with the use of medication then proceed with freeing her from medications prolonging her life - PMT to check in with patient/family daily  Code Status/Advance Care Planning: DNR  Discharge Planning: Anticipated Hospital  Death      Primary Diagnoses: Present on Admission:  NSTEMI (non-ST elevated myocardial infarction) ([place])  (Resolved) Sepsis ([place])  Longstanding persistent atrial fibrillation ([place])  Mitral regurgitation  CKD (chronic kidney disease), stage IV (HCC)  Pressure injury of skin  Cardiogenic shock ([place])  Pulmonary edema with congestive heart failure with reduced left ventricular function ([place])   I have reviewed the medical record, interviewed the patient and family, and examined the patient. The following aspects are pertinent.  Past Medical History:  Diagnosis Date   Atrial fibrillation ([place])    Blood transfusion    " no reaction to transfusion"   Cancer ([place])    Complication of anesthesia    sensitive to drugs   Dysrhythmia    atrial flutter   Headache(784.0)    Hyperlipidemia    Hypertension    Hypothyroidism    Impaired fasting glucose    Kidney disease, chronic, stage IV (GFR 15-29 ml/min) (HCC)    Leiomyosarcoma of uterus (HCC)    Chemotherapy, Magrinat and Clarke-Pearson   Macular degeneration    Migraine headache    Improved after menopause   Mitral regurgitation 11/27/2012   Renal disorder    Shortness of breath    Shortness of breath    Cardiology Eval., possible diastolic dysfunctio   UTI (urinary tract infection)    Frequent   Visual loss, right eye    Left ocular accident   Vitamin D deficiency    Wrist fracture, right 12/2009   Social History   Socioeconomic History   Marital status: Single    Spouse name: Not on file   Number of children: Not on file   Years of education: Not on file   Highest education level: Not on file  Occupational History   Not on file  Tobacco Use   Smoking status: Never   Smokeless tobacco: Never  Vaping Use   Vaping Use: Never used  Substance and Sexual Activity   Alcohol use: No   Drug use: No   Sexual activity: Never    Birth control/protection: Post-menopausal  Other Topics Concern   Not on file  Social  History Narrative   Not on file   Social Determinants of Health   Financial Resource Strain: Not on file  Food Insecurity: Not on file  Transportation Needs: Not on file  Physical Activity: Not on file  Stress: Not on file  Social Connections: Not on file   Family History  Problem Relation Age of Onset   Heart disease Mother    Hypertension Mother    Cancer Father    Peripheral Artery Disease Sister    Hypertension Sister    Heart disease Maternal Grandmother    Heart disease Maternal Grandfather    Breast cancer Neg Hx    Scheduled Meds:  acetaminophen  650 mg Oral Q6H   aspirin EC  81 mg Oral Daily   atorvastatin  40 mg Oral Q2000   Chlorhexidine Gluconate Cloth  6 each Topical Daily   cholecalciferol  2,000 Units Oral Daily   docusate sodium  100 mg Oral Daily   feeding supplement (KATE FARMS STANDARD 1.4)  325 mL Oral TID BM   levothyroxine  50 mcg Oral QODAY  levothyroxine  75 mcg Oral QODAY   lidocaine-prilocaine   Topical Once   mouth rinse  15 mL Mouth Rinse BID   midodrine  15 mg Oral TID WC   multivitamin with minerals  1 tablet Oral Daily   polyethylene glycol  17 g Oral Daily   sodium chloride flush  3 mL Intravenous Q12H   Continuous Infusions:  sodium chloride     sodium chloride     sodium chloride     amiodarone 60 mg/hr (03/17/21 1502)   ceFEPime (MAXIPIME) IV Stopped (03/17/21 1025)   heparin 1,250 Units/hr (03/17/21 1502)   milrinone 0.375 mcg/kg/min (03/17/21 1502)   norepinephrine (LEVOPHED) Adult infusion 30 mcg/min (03/17/21 1502)   vasopressin 0.02 Units/min (03/17/21 1502)   PRN Meds:.Place/Maintain arterial line **AND** sodium chloride, sodium chloride, acetaminophen, alum & mag hydroxide-simeth, ondansetron (ZOFRAN) IV, ondansetron (ZOFRAN) IV, polyvinyl alcohol, sodium chloride flush Allergies  Allergen Reactions   Plendil [Felodipine]    Aspirin Other (See Comments)    Stomach upset   Barbiturates Other (See Comments)    Poorly  tolerated   Eggs Or Egg-Derived Products Other (See Comments)    diarrhea   Epinephrine Other (See Comments)    Fever    Erythromycin Base Nausea And Vomiting    All mycins   Felodipine Er Other (See Comments)    headache   Morphine And Related Other (See Comments)    Rash    Oxycodone Hcl Other (See Comments)    Heart race   Penicillins Other (See Comments)    Fever    Pravachol Other (See Comments)    Muscle ache   Soy Allergy Other (See Comments)    shaking   Sulfa Antibiotics Other (See Comments)    rash   Wheat Other (See Comments)    Stomach upset   Adhesive [Tape] Other (See Comments)    Rash    Review of Systems  Constitutional:  Positive for activity change, appetite change and fatigue.  Neurological:  Positive for weakness.   Physical Exam Constitutional:      Comments: Very slight confusion occasionally  Pulmonary:     Effort: Pulmonary effort is normal.  Skin:    General: Skin is warm and dry.  Neurological:     Mental Status: She is alert and oriented to person, place, and time.    Vital Signs: BP (!) 84/75    Pulse (!) 117    Temp 98.2 F (36.8 C)    Resp (!) 23    Ht '5\' 3"'  (1.6 m)    Wt 68.6 kg    SpO2 91%    BMI 26.79 kg/m  Pain Scale: 0-10 POSS *See Group Information*: S-Acceptable,Sleep, easy to arouse Pain Score: 0-No pain   SpO2: SpO2: 91 % O2 Device:SpO2: 91 % O2 Flow Rate: .O2 Flow Rate (L/min): 7 L/min  IO: Intake/output summary:  Intake/Output Summary (Last 24 hours) at 03/17/2021 1657 Last data filed at 03/17/2021 1502 Gross per 24 hour  Intake 1774.64 ml  Output 750 ml  Net 1024.64 ml    LBM: Last BM Date: 03/17/21 Baseline Weight: Weight: 69.1 kg Most recent weight: Weight: 68.6 kg     Palliative Assessment/Data: PPS 20%    Juel Burrow, DNP, [facility] Palliative Medicine Team [phone] Pager: [phone]

## 2021-03-17 NOTE — Progress Notes (Signed)
ANTICOAGULATION CONSULT NOTE  Pharmacy Consult for IV heparin Indication: chest pain/ACS  Allergies  Allergen Reactions   Plendil [Felodipine]    Aspirin Other (See Comments)    Stomach upset   Barbiturates Other (See Comments)    Poorly tolerated   Eggs Or Egg-Derived Products Other (See Comments)    diarrhea   Epinephrine Other (See Comments)    Fever    Erythromycin Base Nausea And Vomiting    All mycins   Felodipine Er Other (See Comments)    headache   Morphine And Related Other (See Comments)    Rash    Oxycodone Hcl Other (See Comments)    Heart race   Penicillins Other (See Comments)    Fever    Pravachol Other (See Comments)    Muscle ache   Soy Allergy Other (See Comments)    shaking   Sulfa Antibiotics Other (See Comments)    rash   Wheat Other (See Comments)    Stomach upset   Adhesive [Tape] Other (See Comments)    Rash     Patient Measurements: Height: 5\' 3"  (160 cm) Weight: 68.6 kg (151 lb 3.8 oz) IBW/kg (Calculated) : 52.4 Heparin Dosing Weight: 68.5kg  Vital Signs: Temp: 98.1 F (36.7 C) (01/21 1930) Temp Source: Oral (01/21 1928) BP: 86/60 (01/21 1930) Pulse Rate: 111 (01/21 1930)  Labs: Recent Labs    03/15/21 0531 03/15/21 1645 03/15/21 1856 03/01/2021 0506 03/16/2021 1654 03/17/21 0902 03/17/21 1000 03/17/21 1903  HGB 10.2*  --   --  9.6* 9.5*   9.5* 8.3*  --   --   HCT 31.4*  --   --  29.7* 28.0*   28.0* 25.4*  --   --   PLT 225  --   --  211  --  199  --   --   HEPARINUNFRC 0.18*   < >  --  0.24*  --   --  0.20* 0.52  CREATININE 1.54*   < > 1.45* 1.60*  --  2.00*  --   --    < > = values in this interval not displayed.     Estimated Creatinine Clearance: 18.4 mL/min (A) (by C-G formula based on SCr of 2 mg/dL (H)).   Medical History: Past Medical History:  Diagnosis Date   Atrial fibrillation ([place])    Blood transfusion    " no reaction to transfusion"   Cancer ([place])    Complication of anesthesia    sensitive to  drugs   Dysrhythmia    atrial flutter   Headache(784.0)    Hyperlipidemia    Hypertension    Hypothyroidism    Impaired fasting glucose    Kidney disease, chronic, stage IV (GFR 15-29 ml/min) (HCC)    Leiomyosarcoma of uterus (HCC)    Chemotherapy, Magrinat and Clarke-Pearson   Macular degeneration    Migraine headache    Improved after menopause   Mitral regurgitation 11/27/2012   Renal disorder    Shortness of breath    Shortness of breath    Cardiology Eval., possible diastolic dysfunctio   UTI (urinary tract infection)    Frequent   Visual loss, right eye    Left ocular accident   Vitamin D deficiency    Wrist fracture, right 12/2009   Assessment: 34 YOF presenting to ED for chest pain. PMH significant for atrial flutter on chronic anticoagulation with warfarin. Pharmacy to dose IV heparin.   She is s/p cath. Heparin level came back  therapeutic at 0.52, on 1250 units/hr.   Goal of Therapy:  Heparin level 0.3-0.7 units/ml Monitor platelets by anticoagulation protocol: Yes   Plan:  -Continue heparin 1250 units/hr  -Monitor daily with CBC, HL   Hildred Laser, PharmD Clinical Pharmacist **Pharmacist phone directory can now be found on [email or web address] ([email or web address]).  Listed under [place].

## 2021-03-17 NOTE — Progress Notes (Signed)
Pharmacy Antibiotic Note  [name] is a 88 y.o. female admitted on 03/24/2021 with sepsis of unknown source. Pharmacy has been consulted for vancomycin/cefepime dosing.  Scr today came back at 2 (CrCl 18 mL/min). Afebrile. WBC 22>18. On high pressor requirements. Currently on day #5 of antibiotics.   Plan: Stop vancomycin  Cefepime 2gm q24hr for total 7 days  Plan to obtain levels at steady state Will monitor for acute changes in renal function and adjust as needed F/u cultures results and de-escalate as appropriate  Height: 5\' 3"  (160 cm) Weight: 68.6 kg (151 lb 3.8 oz) IBW/kg (Calculated) : 52.4  Temp (24hrs), Avg:98.1 F (36.7 C), Min:97.9 F (36.6 C), Max:98.4 F (36.9 C)  Recent Labs  Lab 03/21/2021 2029 03/21/2021 2227 03/02/2021 0120 03/07/2021 0122 03/04/2021 0641 03/20/2021 1108 03/24/2021 1425 03/11/2021 0509 03/25/2021 1041 03/15/21 0531 03/15/21 1645 03/15/21 1856 03/07/2021 0506  WBC 15.3*  --   --   --  22.7*  --   --  29.3*  --  23.3*  --   --  22.0*  CREATININE 2.09*  --    < >  --  1.89*  --   --  1.85*  --  1.54* 1.57* 1.45* 1.60*  LATICACIDVEN  --  7.4*  --  4.4*  --  4.1* 3.4*  --  1.4  --   --   --   --    < > = values in this interval not displayed.     Estimated Creatinine Clearance: 23 mL/min (A) (by C-G formula based on SCr of 1.6 mg/dL (H)).    Allergies  Allergen Reactions   Plendil [Felodipine]    Aspirin Other (See Comments)    Stomach upset   Barbiturates Other (See Comments)    Poorly tolerated   Eggs Or Egg-Derived Products Other (See Comments)    diarrhea   Epinephrine Other (See Comments)    Fever    Erythromycin Base Nausea And Vomiting    All mycins   Felodipine Er Other (See Comments)    headache   Morphine And Related Other (See Comments)    Rash    Oxycodone Hcl Other (See Comments)    Heart race   Penicillins Other (See Comments)    Fever    Pravachol Other (See Comments)    Muscle ache   Soy Allergy Other (See  Comments)    shaking   Sulfa Antibiotics Other (See Comments)    rash   Wheat Other (See Comments)    Stomach upset   Adhesive [Tape] Other (See Comments)    Rash     Antimicrobials this admission: Vancomycin 1/17 >> 1/21  Metronidazole 1/17 x1 Cefepime 1/17 >>   Dose adjustments this admission: None  Microbiology results: 1/17 BCx: ngtd  1/17 UCx: not collected   Thank you for allowing pharmacy to be a part of this patients care.  Antonietta Jewel, PharmD, [place] Clinical Pharmacist  Phone: [phone] 03/17/2021 9:15 AM  Please check AMION for all [facility] phone numbers After 10:00 PM, call [place] [phone]

## 2021-03-17 NOTE — Progress Notes (Signed)
[facility] for IV heparin Indication: chest pain/ACS  Allergies  Allergen Reactions   Plendil [Felodipine]    Aspirin Other (See Comments)    Stomach upset   Barbiturates Other (See Comments)    Poorly tolerated   Eggs Or Egg-Derived Products Other (See Comments)    diarrhea   Epinephrine Other (See Comments)    Fever    Erythromycin Base Nausea And Vomiting    All mycins   Felodipine Er Other (See Comments)    headache   Morphine And Related Other (See Comments)    Rash    Oxycodone Hcl Other (See Comments)    Heart race   Penicillins Other (See Comments)    Fever    Pravachol Other (See Comments)    Muscle ache   Soy Allergy Other (See Comments)    shaking   Sulfa Antibiotics Other (See Comments)    rash   Wheat Other (See Comments)    Stomach upset   Adhesive [Tape] Other (See Comments)    Rash     Patient Measurements: Height: 5\' 3"  (160 cm) Weight: 68.6 kg (151 lb 3.8 oz) IBW/kg (Calculated) : 52.4 Heparin Dosing Weight: 68.5kg  Vital Signs: Temp: 98.2 F (36.8 C) (01/21 0758) Temp Source: Axillary (01/21 0758) BP: 66/57 (01/21 0630) Pulse Rate: 120 (01/21 0630)  Labs: Recent Labs    03/15/21 0531 03/15/21 1645 03/15/21 1856 03/05/2021 0506 03/20/2021 1654 03/17/21 0902 03/17/21 1000  HGB 10.2*  --   --  9.6* 9.5*   9.5* 8.3*  --   HCT 31.4*  --   --  29.7* 28.0*   28.0* 25.4*  --   PLT 225  --   --  211  --  199  --   HEPARINUNFRC 0.18* 0.28*  --  0.24*  --   --  0.20*  CREATININE 1.54* 1.57* 1.45* 1.60*  --   --   --      Estimated Creatinine Clearance: 23 mL/min (A) (by C-G formula based on SCr of 1.6 mg/dL (H)).   Medical History: Past Medical History:  Diagnosis Date   Atrial fibrillation ([place])    Blood transfusion    " no reaction to transfusion"   Cancer ([place])    Complication of anesthesia    sensitive to drugs   Dysrhythmia    atrial flutter   Headache(784.0)    Hyperlipidemia     Hypertension    Hypothyroidism    Impaired fasting glucose    Kidney disease, chronic, stage IV (GFR 15-29 ml/min) (HCC)    Leiomyosarcoma of uterus (HCC)    Chemotherapy, Magrinat and Clarke-Pearson   Macular degeneration    Migraine headache    Improved after menopause   Mitral regurgitation 11/27/2012   Renal disorder    Shortness of breath    Shortness of breath    Cardiology Eval., possible diastolic dysfunctio   UTI (urinary tract infection)    Frequent   Visual loss, right eye    Left ocular accident   Vitamin D deficiency    Wrist fracture, right 12/2009   Assessment: 60 YOF presenting to ED for chest pain. PMH significant for atrial flutter on chronic anticoagulation with warfarin. Pharmacy to dose IV heparin.   She is now s/p cath. Heparin level came back subtherapeutic at 0.2, on 1150 units/hr. Hgb 8.3, plt 199. No s/sx of bleeding or infusion issues.   Goal of Therapy:  Heparin level 0.3-0.7 units/ml Monitor platelets  by anticoagulation protocol: Yes   Plan:  -Increase heparin 1250 units/hr  -Order 8 hours heparin level  -Monitor daily with CBC, HL  Antonietta Jewel, PharmD, [place] Pharmacist  Phone: [phone] 03/17/2021 10:48 AM  Please check AMION for all [place] phone numbers After 10:00 PM, call [place] [phone]

## 2021-03-17 NOTE — Progress Notes (Signed)
Patient ID: [name], female   DOB: 1931/12/28, 88 y.o.   MRN: [id number]     Advanced Heart Failure Rounding Note  PCP-Cardiologist: Candee Furbish, MD   Subjective:    1/17 Cardio/septic shock. Started on norepi + milrinone + lasix drip. Initial CO-OX 29%.  Placed on IV antibiotics.  1/18 Norepi requirements increased, continued on milrinone 0.375 mcg  and vasopressin was added.  A line placed.  1/20  Chest CT Bilateral LL airspace disease L> R 1/20 Cath:Severe CAD with acute RCA occlusion and 95% mLAD. Low output on multiple pressors. PAPI 1.3  Echo EF 25% with severe AS and RV dysfunction  Says procedures yesterday were very painful and she wasn't sure she was going to survive them. Does not want any more procedures. Says she is "ready to go upstairs"    Luiz Blare numbers done personally.   CVP 10 PA 30/16 (23) PCWP 16  Currently on vaso 0.02 units, norepi 26 mcg, and milrinone 0.375 mcg. Lasix gtt at 7.  Early am co-ox 49%. Weight unchanged.   SCr 1.6.    R/L Cath 1/20    Ost LAD lesion is 40% stenosed.   Mid Cx lesion is 30% stenosed.   Mid LAD lesion is 95% stenosed.   2nd Diag lesion is 70% stenosed.   Prox RCA to Dist RCA lesion is 100% stenosed.   1. Elevated R>L heart filling pressures.  2. Mild pulmonary venous hypertension.  3. Low cardiac output, CI around 2.  4. Significant RV dysfunction with PAPI 1.3.  5. Totally occluded proximal RCA with weak collaterals.  This is likely an acute occlusion and concern for RV infarct based on echo and RV dysfunction by RHC.  6. Bifurcation lesion mid LAD/D2 with 95% mLAD just after D2, 70% ostial D2.  7. Aortic runoff showed about 60% ostial left common iliac artery stenosis.   Objective:   Weight Range: 68.6 kg Body mass index is 26.79 kg/m.   Vital Signs:   Temp:  [97.9 F (36.6 C)-98.4 F (36.9 C)] 98.2 F (36.8 C) (01/21 0758) Pulse Rate:  [0-153] 120 (01/21 0630) Resp:  [0-72] 21 (01/21 0630) BP:  (66-118)/(51-85) 66/57 (01/21 0630) SpO2:  [0 %-100 %] 91 % (01/21 0630) Arterial Line BP: (83-104)/(39-60) 93/53 (01/21 0215) Last BM Date: 03/17/21  Weight change: Filed Weights   03/03/2021 0419 03/15/21 0500 03/20/2021 0500  Weight: 69.1 kg 68.8 kg 68.6 kg    Intake/Output:   Intake/Output Summary (Last 24 hours) at 03/17/2021 0901 Last data filed at 03/17/2021 0600 Gross per 24 hour  Intake 1001.35 ml  Output 750 ml  Net 251.35 ml       Physical Exam   General:  Elderly. Weak appearing. No resp difficulty HEENT: normal Neck: supple. LIJ swan. Carotids 2+ bilat; + bruits. No lymphadenopathy or thryomegaly appreciated. Cor: PMI nondisplaced. Irreg tach + AS Lungs: coarse Abdomen: soft, nontender, nondistended. No hepatosplenomegaly. No bruits or masses. Good bowel sounds. Extremities: no cyanosis, clubbing, rash, trace edema. RFA site ok  Neuro: alert & orientedx3, cranial nerves grossly intact. moves all 4 extremities w/o difficulty. Affect pleasant   Telemetry  A fib 110-120s  (personally reviewed)  Labs    CBC Recent Labs    03/15/21 0531 03/24/2021 0506 02/26/2021 1654  WBC 23.3* 22.0*  --   HGB 10.2* 9.6* 9.5*   9.5*  HCT 31.4* 29.7* 28.0*   28.0*  MCV 90.8 92.2  --   PLT 225 211  --  Basic Metabolic Panel Recent Labs    03/15/21 0531 03/15/21 1645 03/15/21 1856 02/28/2021 0506 03/27/2021 1654  NA 131*   < > 133* 131* 131*   132*  K 2.6*   < > 3.4* 3.9 4.5   4.4  CL 93*   < > 95* 94*  --   CO2 27   < > 24 26  --   GLUCOSE 173*   < > 161* 177*  --   BUN 34*   < > 27* 26*  --   CREATININE 1.54*   < > 1.45* 1.60*  --   CALCIUM 7.1*   < > 7.1* 7.1*  --   MG 2.5*  --   --  2.3  --    < > = values in this interval not displayed.    Liver Function Tests Recent Labs    03/15/21 0531  AST 62*  ALT 66*  ALKPHOS 58  BILITOT 0.9  PROT 6.2*  ALBUMIN 3.0*    No results for input(s): LIPASE, AMYLASE in the last 72 hours. Cardiac Enzymes No results  for input(s): CKTOTAL, CKMB, CKMBINDEX, TROPONINI in the last 72 hours.  BNP: BNP (last 3 results) Recent Labs    03/01/2021 0123  BNP 1,091.5*     ProBNP (last 3 results) No results for input(s): PROBNP in the last 8760 hours.   D-Dimer No results for input(s): DDIMER in the last 72 hours. Hemoglobin A1C No results for input(s): HGBA1C in the last 72 hours.  Fasting Lipid Panel No results for input(s): CHOL, HDL, LDLCALC, TRIG, CHOLHDL, LDLDIRECT in the last 72 hours.  Thyroid Function Tests No results for input(s): TSH, T4TOTAL, T3FREE, THYROIDAB in the last 72 hours.  Invalid input(s): FREET3   Other results:   Imaging    CT CHEST WO CONTRAST  Result Date: 03/24/2021 CLINICAL DATA:  Follow-up pneumonia. EXAM: CT CHEST WITHOUT CONTRAST TECHNIQUE: Multidetector CT imaging of the chest was performed following the standard protocol without IV contrast. RADIATION DOSE REDUCTION: This exam was performed according to the departmental dose-optimization program which includes automated exposure control, adjustment of the mA and/or kV according to patient size and/or use of iterative reconstruction technique. COMPARISON:  Chest x-ray 03/15/2021. CT chest abdomen and pelvis 01/25/2008. FINDINGS: Cardiovascular: Tip of right upper extremity PICC line terminates in the right axillary region. Left-sided central venous catheter tip ends in the distal SVC. The heart is mildly enlarged. There is no pericardial effusion. Aorta is normal in size. There are atherosclerotic calcifications of the aorta and coronary arteries. Mediastinum/Nodes: No enlarged mediastinal or axillary lymph nodes. Thyroid gland, trachea, and esophagus demonstrate no significant findings. There is a small hiatal hernia which has increased in size compared to 2009. Lungs/Pleura: There is patchy bilateral lower lobe airspace disease with air bronchograms. There is atelectasis in the lingula and posterior left upper lobe.  There is also likely dependent atelectasis in the right upper lobe. There are small bilateral pleural effusions, left greater than right. There is no evidence for pneumothorax. Ground-glass/solid nodular density measuring 6 mm is identified in the right upper lobe image 4/31 which is new from prior. Upper Abdomen: No acute abnormality. Musculoskeletal: Multilevel degenerative changes affect spine. No acute fractures are seen. IMPRESSION: 1. Bilateral lower lobe airspace disease/consolidation worrisome for pneumonia. 2. Small bilateral pleural effusions. 3. 6 mm ground-glass/solid nodule in the right upper lobe. Non-contrast chest CT at 3-6 months is recommended. If nodules persist, subsequent management will be based upon the  most suspicious nodule(s). This recommendation follows the consensus statement: Guidelines for Management of Incidental Pulmonary Nodules Detected on CT Images: From the Fleischner Society 2017; Radiology 2017; 284:228-243. 4. Right upper extremity PICC terminates in the right axillary region. Recommend repositioning. 5. Left-sided central venous catheter ends in the distal SVC. 6. Mild cardiomegaly. 7. Small hiatal hernia. 8.  Aortic Atherosclerosis (ICD10-I70.0). Electronically Signed   By: Ronney Asters M.D.   On: 03/23/2021 15:23   CARDIAC CATHETERIZATION  Result Date: 03/02/2021   Ost LAD lesion is 40% stenosed.   Mid Cx lesion is 30% stenosed.   Mid LAD lesion is 95% stenosed.   2nd Diag lesion is 70% stenosed.   Prox RCA to Dist RCA lesion is 100% stenosed. 1. Elevated R>L heart filling pressures. 2. Mild pulmonary venous hypertension. 3. Low cardiac output, CI around 2. 4. Significant RV dysfunction with PAPI 1.3. 5. Totally occluded proximal RCA with weak collaterals.  This is likely an acute occlusion and concern for RV infarct based on echo and RV dysfunction by RHC. 6. Bifurcation lesion mid LAD/D2 with 95% mLAD just after D2, 70% ostial D2. 7. Aortic runoff showed about 60%  ostial left common iliac artery stenosis. About 60 cc contrast used including with runoff. Discussed with Dr. Ali Lowe.  Impella placement followed by balloon aortic valvuloplasty then intervention on LAD and ?RCA would likely be our interventional option.  Will reassess with colleagues over the weekend and tentatively plan for Monday if family is in agreement and she remains stable (will need to watch renal function carefully).     Medications:     Scheduled Medications:  acetaminophen  650 mg Oral Q6H   aspirin EC  81 mg Oral Daily   atorvastatin  40 mg Oral Q2000   Chlorhexidine Gluconate Cloth  6 each Topical Daily   cholecalciferol  2,000 Units Oral Daily   docusate sodium  100 mg Oral Daily   feeding supplement (KATE [employer]ARMS STANDARD 1.4)  325 mL Oral TID BM   levothyroxine  50 mcg Oral QODAY   levothyroxine  75 mcg Oral QODAY   lidocaine-prilocaine   Topical Once   mouth rinse  15 mL Mouth Rinse BID   midodrine  10 mg Oral TID WC   multivitamin with minerals  1 tablet Oral Daily   polyethylene glycol  17 g Oral Daily   sodium chloride flush  3 mL Intravenous Q12H    Infusions:  sodium chloride     sodium chloride     sodium chloride     amiodarone 60 mg/hr (03/17/21 0735)   ceFEPime (MAXIPIME) IV Stopped (03/11/2021 1052)   furosemide (LASIX) 200 mg in dextrose 5% 100 mL (2mg /mL) infusion 7 mg/hr (02/25/2021 2110)   heparin 1,150 Units/hr (03/17/21 0234)   milrinone 0.375 mcg/kg/min (03/09/2021 2103)   norepinephrine (LEVOPHED) Adult infusion 26 mcg/min (03/11/2021 1943)   vancomycin Stopped (03/15/21 1424)   vasopressin 0.02 Units/min (03/17/21 0813)    PRN Medications: Place/Maintain arterial line **AND** sodium chloride, sodium chloride, acetaminophen, alum & mag hydroxide-simeth, ondansetron (ZOFRAN) IV, ondansetron (ZOFRAN) IV, sodium chloride flush    Patient Profile  [name] is a 88 year old with a history of  severe AS, A fib/Aflutter, HTN, HLD, CKD, hypothyroidism,  leimyosarcoma 2003, and 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  Also being worked up for TAVR.   Admitted with NSTEMI + shock cardiogenic/septic.   Assessment/Plan   1. Shock: Initial concern for septic + cardiogenic etiology.  Occurred after extraction  of infected teeth. However cath suggests acute RCA infarction as probable culprit. Echo with LV EF 20-25% and moderate RVE/severe RV dysfunction; marked change from prior with biventricular failure and peri-apical akinesis of both the LV and RV, relative basal preservation; severe aortic stenosis, moderate-severe mitral regurgitation, dilated IVC. Lactate initially 7.4. Cath (1/20):Severe CAD with acute RCA occlusion and 95% mLAD. Low output on multiple pressors. PAPI 1.3\. Currently on vaso 0.02 units, norepi 26 mcg, and milrinone 0.375 mcg. Lasix gtt at 7.  Early am co-ox 49%. Weight unchanged. - Continue milrinone 0.375.  - Titrate NE/VP as needed  Increase to midodrine to 15 mg tid.  - Vancomycin/cefepime started empirically.  - See below for discussion of cardiac issues 2. Acute systolic CHF -> cardiogenic shock: Echo this admission with LV EF 20-25% and moderate RVE/severe RV dysfunction; marked change from prior with biventricular failure and peri-apical akinesis of both the LV and RV, relative basal preservation; severe aortic stenosis, moderate-severe mitral regurgitation, dilated IVC. Marked change from 12/22.  Cath 1/20 Severe CAD with acute RCA occlusion and 95% mLAD. Low output on multiple pressors. PAPI 1.3 Currently on vaso 0.02 units, norepi 26 mcg, and milrinone 0.375 mcg. Lasix gtt at 7. Co-ox not sent yet (swan not working -> I repositioned). Will send.  - Continue milrinone 0.375, will resend co-ox.  - BP remains low. Doubt we will be able to wean pressors much today - Stop lasix with PCWP 16 and RV infarct/preload dependence - Dr. Aundra Dubin has reviewed case with Dr. Ali Lowe. Considering BAV with PCI LAD +/- RCA with Impella support.  In reviewing cath films suspect she femoral/iliac vessels will not support Impella and will not address RV failure. RCAinfarct likely a week old and doubt would benefit from PCI.  - I had long discussion with patient and her daughter this am. She is clear that she does not want any more procedures and wants to "go upstairs". We discussed code status as well. She wants to be DNR/DNI.  - Will involve Palliative Care to help discuss GOC and timing of turning inotropes off, etc. 3. CAD: NSTEMI with HS-TnI  [phone].  Cath 1/20. Severe CAD with acute RCA occlusion and 95% mLAD. Low output on multiple pressors. PAPI 1.3\. Currently on vaso 0.02 units, norepi 26 mcg, and milrinone 0.375 mcg. Lasix gtt at 7.  Early am co-ox 49%.  - Continue heparin for now - ASA, statin.  4. Aortic stenosis: Severe low flow/low gradient aortic stenosis.  Has started TAVR workup.  - Discussed with Dr. Ali Lowe.  See above 5. AKI on CKD stage 3: Baseline creatinine around 1.5.  2.09 at admission, 1.6 now.  - Support cardiac output and follow closely.  6. Mitral regurgitation: Moderate to severe on echo, likely functional.   7. Atrial fibrillation: Chronic.  Rate in 110-120s for now.  - On heparin gtt.  - Continue amiodarone gtt 60 mg/hr for rate control.  - Try to wean NE.   8. Legally blind 9. ID: WBCs 22 but afebrile.  Concern for septic shock picture. ?Odontogenic seeding from dental extractions. ?PNA on CXR. PCT 0.38, not markedly high, but SVR low initially in 900s on pressor. Cultures NGTD.   - vancomycin/cefepime empirically.  10. Acute hypoxemic respiratory failure: She is on HFNC but CVP not markedly elevated CXR with bibasilar airspace disease, ?PNA. Also possible pleural effusions.   - Continue abx.  - 1/20  Chest CT Bilateral LL airspace disease L> R - CCM following.  11.  DNR/DNI - see discussions above  CRITICAL CARE Performed by: Glori Bickers  Total critical care time: 60  minutes  Critical care time was exclusive of separately billable procedures and treating other patients.  Critical care was necessary to treat or prevent imminent or life-threatening deterioration.  Critical care was time spent personally by me on the following activities: development of treatment plan with patient and/or surrogate as well as nursing, discussions with consultants, evaluation of patient's response to treatment, examination of patient, obtaining history from patient or surrogate, ordering and performing treatments and interventions, ordering and review of laboratory studies, ordering and review of radiographic studies, pulse oximetry and re-evaluation of patient's condition.  Quillian Quince Clydette Privitera 03/17/2021 9:01 AM

## 2021-03-17 NOTE — Plan of Care (Signed)

## 2021-03-17 NOTE — Progress Notes (Signed)
NAME:  [name], MRN:  [id number], DOB:  09/25/1931, LOS: 5 ADMISSION DATE:  03/04/2021, CONSULTATION DATE:  03/11/2021 REFERRING MD:  Aundra Dubin - HF, CHIEF COMPLAINT: Cardiogenic shock    History of Present Illness:  88 year old woman who was being evaluated in the outpatient setting for possible TAVR for severe AS. As part of this went for dental extraction x 2 on 1/15 (done under general anesthesia). Patient returned home from procedure and reported chest pressure. The following day (1/16) patient noted progressive pressure and associated SOB (for which she took SL NTG with some relief) as well as orthopnea and nausea.   In ED, WBC 15.1, LA 7.4, + trop, Echo w/ septal hypokinesis. Was to be admitted by cardiology. Initial diagnosed with NSTEMI felt to be 2/2 demand ischemia from surgical procedure, c/b severe symptomatic AS.  Oxygen and IV heparin initiated. Trop was noted to be climbing 1/17 (616 -> 9898). Lactate downtrended to 4.4, BNP 1091; however, WBC uptrended to 22.7. Medical service consulted for abx and management of possible sepsis; vanc and cefepime initiated and fluid resuscitated. Heart failure team consulted. Given progressive hypotension, PCCM consulted for assistance with management.  Pertinent Medical History:  Chronic AF/flutter, HTN, severe Aortic stenosis (was being evaluated by cards for possile TAVR) CKD, hypothyroidism, leiomyosarcoma 2003, blindness for macular degeneration  DNR status w/ MOST form   Significant Hospital Events: Including procedures, antibiotic start and stop dates in addition to other pertinent events   1/16 admitted w/ SOB, weakness and orthopnea 1 day after dental extraction. + trop I admitted w/ working dx NSTEMI and acute AS. Also + lactic acidosis  1/17 WBC rising, lactate improved w/ IVFs. Started on vanc and cefepime. CCM consulted for progressive hypotension over course of day. Advanced HF also consulted.  1/18 WBC continues to rise (29 from  22), Hgb stable. CVP 8. Lasix gtt increased to 8. Net -826mL/24H. K 3.2, repleted. Multiple attempts at L radial A-line, unsuccessful. Remains on NE at 20, milrinone 0.375. Co-ox 73%. Preserving R radial for eventual cath. Cr stable. R axillary A-line placed. 1/19 Pressor requirements continue to increase, NE 24/vaso 0.03 today. Continues on milrinone 0.375 with Co-ox 59%. CVP 10. WBC improved, 23 (29). Hgb stable 10.2. Cr slightly improved (1.5 from 1.8). Slight bump in transaminases. 1/20 Persistent high pressor requirements (NE 28, vaso 0.02) and milrinone 0.375. Co-ox 55%. CVP 10-11 on Lasix gtt 5mg /hr. Cr 1.6 (1.45). NPO for ?RHC/LHC today. Valve team evaluating for BAV (eventual TAVR).  Interim History / Subjective:   Patient had LHC and RHC yesterday which showed multivessel CAD and concern for RV infarct.   Discussion had about balloon aortic valvuloplasty and then intervention on LAD and RCA. She experienced a lot of pain with the heart cath procedure and does not wish to move forward with other procedures.   Palliative care has been consulted to work on further care options.  Objective   Blood pressure (!) 66/57, pulse (!) 120, temperature 98.2 F (36.8 C), temperature source Axillary, resp. rate (!) 21, height 5\' 3"  (1.6 m), weight 68.6 kg, SpO2 91 %. CVP:  [11 mmHg-76 mmHg] 11 mmHg      Intake/Output Summary (Last 24 hours) at 03/17/2021 0857 Last data filed at 03/17/2021 0600 Gross per 24 hour  Intake 1001.35 ml  Output 750 ml  Net 251.35 ml   Filed Weights   03/03/2021 0419 03/15/21 0500 03/07/2021 0500  Weight: 69.1 kg 68.8 kg 68.6 kg   Physical Examination:  General: Chronically ill-appearing elderly woman in NAD. HEENT: [place]/AT, anicteric sclera, PERRL, moist mucous membranes. Neuro: Awake, oriented x 4. Responds to verbal stimuli. Following commands consistently. Moves all 4 extremities spontaneously.  CV: Irregularly irregular rhythm, harsh CDC murmur at baseline. PULM:  mildly labored breathing. No wheezes GI: Soft, nontender, mildly distended. Hypoactive bowel sounds. Extremities: Bilateral symmetric 1+ nonpitting LE edema noted. Skin: Warm/dry, no rashes.  Resolved Hospital Problem List:     Assessment & Plan:  Cardiogenic w/ acute systolic HF, +/- septic shock  Known severe AS. Now w/ last EF 55-60%. Ddx: New NSTEMI resulting in acute Systolic HF or Given recent dental procedure and known poor dentition ? Sepsis 2/2 aspiration. ? Query mix of both. - HF managing as primary - Goal MAP > 65 - Continue NE, vaso titrated to goal MAP - Continue midodrine 15mg  TID - Continue milrinone, trend Co-ox - LHC/RHC 1/20 performed - Cefepime for 7 days, DC vancomycin today   NSTEMI w/ Known severe AS - Cardiology following, appreciate assistance - Manage cardiogenic/septic shock as above - Cardiac monitoring - Continue ASA/heparin - LHC/RHC once hemodynamically stable to tolerate; possible 1/20  Dyspnea w/ acute pulmonary edema vs aspiration PNA  CXR w/worsening bilateral airspace disease. Favoring edema. - Continue O2 support with HFNC - Wean O2 for sat > 90% - Pulmonary hygiene - Diuresis as tolerated, on Lasix gtt - Intermittent CXR  - Antibiotics as above - No pleural effusion amenable to thoracentesis on bedside US on 1/20  CAF/flutter - Continue heparin gtt - Cardiac monitoring  Acute on chronic renal failure superimposed on CKD stage III Baseline scr 1.4 now 1.89 (peak 2.09 1/17) - Trend BMP - Replete electrolytes as indicated - Monitor strict I&Os on Lasix gtt - Avoid nephrotoxic agents as able - Ensure adequate renal perfusion  Hypothyroidism  - Continue Synthroid  Left hip pain Chronic sciatica Chronic L hip pain for which patient takes Tylenol at home. - Scheduled APAP 650mg  Q6H - Consider gabapentin or similar, though hesitant to utilize in geriatric population  H/o Leiomyosarcoma  - S/p treatment (2004) outpatient f/u per  usual surveillance protocol  DNR status - Noted  PCCM will signoff given patient will be transitioning to comfort measures in the coming days.   Best Practice: (right click and "Reselect all SmartList Selections" daily)   Diet/type: Regular consistency (see orders)  DVT prophylaxis: systemic heparin GI prophylaxis: N/A Lines: Central line Foley:  Yes, and it is still needed Code Status:  DNR Last date of multidisciplinary goals of care discussion [Patient had discussion with cardiology team today, she does not wish to go forward with further procedures. Palliative care has been consulted.]  Critical care time: 35 minutes   Freda Jackson, MD [place] Office: [phone]   See Amion for personal pager PCCM on call pager [phone] until 7pm. Please call Elink 7p-7a. [phone]

## 2021-03-18 DIAGNOSIS — Z515 Encounter for palliative care: Secondary | ICD-10-CM | POA: Diagnosis not present

## 2021-03-18 DIAGNOSIS — Z7189 Other specified counseling: Secondary | ICD-10-CM | POA: Diagnosis not present

## 2021-03-18 LAB — CULTURE, BLOOD (ROUTINE X 2)
Culture: NO GROWTH
Culture: NO GROWTH

## 2021-03-18 LAB — CBC
HCT: 24.8 % — ABNORMAL LOW (ref 36.0–46.0)
Hemoglobin: 8 g/dL — ABNORMAL LOW (ref 12.0–15.0)
MCH: 29.7 pg (ref 26.0–34.0)
MCHC: 32.3 g/dL (ref 30.0–36.0)
MCV: 92.2 fL (ref 80.0–100.0)
Platelets: 186 10*3/uL (ref 150–400)
RBC: 2.69 MIL/uL — ABNORMAL LOW (ref 3.87–5.11)
RDW: 15.2 % (ref 11.5–15.5)
WBC: 29.1 10*3/uL — ABNORMAL HIGH (ref 4.0–10.5)
nRBC: 1.3 % — ABNORMAL HIGH (ref 0.0–0.2)

## 2021-03-18 LAB — COOXEMETRY PANEL
Carboxyhemoglobin: 1.1 % (ref 0.5–1.5)
Methemoglobin: 1 % (ref 0.0–1.5)
O2 Saturation: 54.9 %
Total hemoglobin: 7.8 g/dL — ABNORMAL LOW (ref 12.0–16.0)

## 2021-03-18 LAB — BASIC METABOLIC PANEL WITH GFR
Anion gap: 16 — ABNORMAL HIGH (ref 5–15)
BUN: 38 mg/dL — ABNORMAL HIGH (ref 8–23)
CO2: 19 mmol/L — ABNORMAL LOW (ref 22–32)
Calcium: 7.1 mg/dL — ABNORMAL LOW (ref 8.9–10.3)
Chloride: 94 mmol/L — ABNORMAL LOW (ref 98–111)
Creatinine, Ser: 2.71 mg/dL — ABNORMAL HIGH (ref 0.44–1.00)
GFR, Estimated: 16 mL/min — ABNORMAL LOW
Glucose, Bld: 163 mg/dL — ABNORMAL HIGH (ref 70–99)
Potassium: 4.3 mmol/L (ref 3.5–5.1)
Sodium: 129 mmol/L — ABNORMAL LOW (ref 135–145)

## 2021-03-18 LAB — HEPARIN LEVEL (UNFRACTIONATED): Heparin Unfractionated: 0.72 [IU]/mL — ABNORMAL HIGH (ref 0.30–0.70)

## 2021-03-18 LAB — MAGNESIUM: Magnesium: 2.6 mg/dL — ABNORMAL HIGH (ref 1.7–2.4)

## 2021-03-18 LAB — GLUCOSE, CAPILLARY
Glucose-Capillary: 168 mg/dL — ABNORMAL HIGH (ref 70–99)
Glucose-Capillary: 149 mg/dL — ABNORMAL HIGH (ref 70–99)

## 2021-03-18 MED ORDER — GLYCOPYRROLATE 0.2 MG/ML IJ SOLN
0.2000 mg | INTRAMUSCULAR | Status: DC | PRN
  Administered 2021-03-18: 0.2 mg via INTRAVENOUS
  Filled 2021-03-18: qty 1

## 2021-03-18 MED ORDER — HYDROMORPHONE HCL 1 MG/ML IJ SOLN
1.0000 mg | Freq: Once | INTRAMUSCULAR | Status: AC
Start: 2021-03-18 — End: 2021-03-18
  Administered 2021-03-18: 1 mg via INTRAVENOUS
  Filled 2021-03-18: qty 1

## 2021-03-18 MED ORDER — SODIUM CHLORIDE 0.9 % IV SOLN
1.0000 mg/h | INTRAVENOUS | Status: DC
  Administered 2021-03-18: 2 mg/h via INTRAVENOUS
  Filled 2021-03-18: qty 2.5

## 2021-03-18 MED ORDER — HYDROMORPHONE BOLUS VIA INFUSION
1.0000 mg | INTRAVENOUS | Status: DC | PRN
  Filled 2021-03-18: qty 1

## 2021-03-18 MED ORDER — ONDANSETRON HCL 4 MG/2ML IJ SOLN: 4.0000 mg | Freq: Four times a day (QID) | INTRAMUSCULAR | Status: DC | PRN

## 2021-03-18 MED ORDER — GLYCOPYRROLATE 1 MG PO TABS: 1.0000 mg | ORAL_TABLET | ORAL | Status: DC | PRN

## 2021-03-18 MED ORDER — GLYCOPYRROLATE 0.2 MG/ML IJ SOLN: 0.2000 mg | INTRAMUSCULAR | Status: DC | PRN

## 2021-03-18 MED ORDER — HALOPERIDOL LACTATE 2 MG/ML PO CONC: 0.5000 mg | ORAL | Status: DC | PRN

## 2021-03-18 MED ORDER — HALOPERIDOL 0.5 MG PO TABS: 0.5000 mg | ORAL_TABLET | ORAL | Status: DC | PRN

## 2021-03-18 MED ORDER — BIOTENE DRY MOUTH MT LIQD: 15.0000 mL | OROMUCOSAL | Status: DC | PRN

## 2021-03-18 MED ORDER — POLYVINYL ALCOHOL 1.4 % OP SOLN: 1.0000 [drp] | Freq: Four times a day (QID) | OPHTHALMIC | Status: DC | PRN

## 2021-03-18 MED ORDER — ONDANSETRON 4 MG PO TBDP
4.0000 mg | ORAL_TABLET | Freq: Four times a day (QID) | ORAL | Status: DC | PRN
  Filled 2021-03-18: qty 1

## 2021-03-18 MED ORDER — HALOPERIDOL LACTATE 5 MG/ML IJ SOLN: 0.5000 mg | INTRAMUSCULAR | Status: DC | PRN

## 2021-03-19 ENCOUNTER — Encounter (HOSPITAL_COMMUNITY): Payer: Self-pay | Admitting: Cardiology

## 2021-03-28 NOTE — Progress Notes (Signed)
Patient ID: JENTRY MCQUEARY, female   DOB: Jun 29, 1931, 88 y.o.   MRN: [id number]     Advanced Heart Failure Rounding Note  PCP-Cardiologist: Candee Furbish, MD   Subjective:    1/17 Cardio/septic shock. Started on norepi + milrinone + lasix drip. Initial CO-OX 29%.  Placed on IV antibiotics.  1/18 Norepi requirements increased, continued on milrinone 0.375 mcg  and vasopressin was added.  A line placed.  1/20  Chest CT Bilateral LL airspace disease L> R 1/20 Cath:Severe CAD with acute RCA occlusion and 95% mLAD. Low output on multiple pressors. PAPI 1.3  Echo EF 25% with severe AS and RV dysfunction  Was seen by Palliative Care yesterday. Now DNR/DNI. Refusing further procedures but wants to continue hemodynamic support until she gets to talk to more family.   Much worse today. Weak. Uncomfortable. SOB.   On NE 25 -> 40. VP 0.03  Milrinone 0.375  Co-ox 55%  SCr 1.6 -> 2.0 -> 2.7  WBC 18 -> 29k (on cefepime. Vanc stopped yesterday)    Objective:   Weight Range: 72.7 kg Body mass index is 28.39 kg/m.   Vital Signs:   Temp:  [96.8 F (36 C)-98.2 F (36.8 C)] 96.8 F (36 C) (01/22 0800) Pulse Rate:  [101-131] 123 (01/22 0800) Resp:  [17-28] 28 (01/22 0800) BP: (54-142)/(35-127) 126/94 (01/22 0800) SpO2:  [83 %-99 %] 93 % (01/22 0800) Arterial Line BP: (68-94)/(34-56) 81/48 (01/22 0800) Weight:  [72.7 kg] 72.7 kg (01/22 0500) Last BM Date: 03/17/21  Weight change: Filed Weights   03/15/21 0500 03/01/2021 0500 2021/04/12 0500  Weight: 68.8 kg 68.6 kg 72.7 kg    Intake/Output:   Intake/Output Summary (Last 24 hours) at 12-Apr-2021 0856 Last data filed at 04/12/21 0500 Gross per 24 hour  Intake 3039.8 ml  Output --  Net 3039.8 ml       Physical Exam   General:  Elderly. Ill appearing. Lethargic HEENT: normal Neck: supple. RIJ swan   Cor: PMI nondisplaced. Irregular + AS Lungs: coarse  Abdomen: soft, nontender, nondistended. No hepatosplenomegaly. No bruits or  masses. Good bowel sounds. Extremities: no cyanosis, clubbing, rash, 1+ edema Neuro: alert & orientedx3, cranial nerves grossly intact. moves all 4 extremities w/o difficulty. Affect pleasant   Telemetry   AF 120s Personally reviewed   Labs    CBC Recent Labs    03/17/21 0902 April 12, 2021 0500  WBC 18.4* 29.1*  HGB 8.3* 8.0*  HCT 25.4* 24.8*  MCV 91.4 92.2  PLT 199 366    Basic Metabolic Panel Recent Labs    03/17/21 0902 04/12/21 0500  NA 129* 129*  K 3.9 4.3  CL 97* 94*  CO2 19* 19*  GLUCOSE 196* 163*  BUN 30* 38*  CREATININE 2.00* 2.71*  CALCIUM 7.0* 7.1*  MG 2.5* 2.6*    Liver Function Tests No results for input(s): AST, ALT, ALKPHOS, BILITOT, PROT, ALBUMIN in the last 72 hours.  No results for input(s): LIPASE, AMYLASE in the last 72 hours. Cardiac Enzymes No results for input(s): CKTOTAL, CKMB, CKMBINDEX, TROPONINI in the last 72 hours.  BNP: BNP (last 3 results) Recent Labs    03/01/2021 0123  BNP 1,091.5*     ProBNP (last 3 results) No results for input(s): PROBNP in the last 8760 hours.   D-Dimer No results for input(s): DDIMER in the last 72 hours. Hemoglobin A1C No results for input(s): HGBA1C in the last 72 hours.  Fasting Lipid Panel No results for input(s): CHOL, HDL,  LDLCALC, TRIG, CHOLHDL, LDLDIRECT in the last 72 hours.  Thyroid Function Tests No results for input(s): TSH, T4TOTAL, T3FREE, THYROIDAB in the last 72 hours.  Invalid input(s): FREET3   Other results:   Imaging    No results found.   Medications:     Scheduled Medications:  acetaminophen  650 mg Oral Q6H   aspirin EC  81 mg Oral Daily   atorvastatin  40 mg Oral Q2000   Chlorhexidine Gluconate Cloth  6 each Topical Daily   cholecalciferol  2,000 Units Oral Daily   docusate sodium  100 mg Oral Daily   feeding supplement (KATE FARMS STANDARD 1.4)  325 mL Oral TID BM   levothyroxine  50 mcg Oral QODAY   levothyroxine  75 mcg Oral QODAY    lidocaine-prilocaine   Topical Once   mouth rinse  15 mL Mouth Rinse BID   midodrine  15 mg Oral TID WC   multivitamin with minerals  1 tablet Oral Daily   polyethylene glycol  17 g Oral Daily   sodium chloride flush  3 mL Intravenous Q12H    Infusions:  sodium chloride     sodium chloride     sodium chloride     amiodarone 60 mg/hr (01-Apr-2021 0655)   ceFEPime (MAXIPIME) IV Stopped (03/17/21 1025)   heparin 1,150 Units/hr (Apr 01, 2021 0752)   milrinone 0.375 mcg/kg/min (04-01-2021 0500)   norepinephrine (LEVOPHED) Adult infusion 40 mcg/min (04/01/2021 0807)   vasopressin 0.03 Units/min (2021-04-01 0500)    PRN Medications: Place/Maintain arterial line **AND** sodium chloride, sodium chloride, acetaminophen, alum & mag hydroxide-simeth, ondansetron (ZOFRAN) IV, ondansetron (ZOFRAN) IV, polyvinyl alcohol, sodium chloride flush    Patient Profile  Ms Bost is a 88 year old with a history of  severe AS, A fib/Aflutter, HTN, HLD, CKD, hypothyroidism, leimyosarcoma 2003, and 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  Also being worked up for TAVR.   Admitted with NSTEMI + shock cardiogenic/septic.   Assessment/Plan   1. Shock: Initial concern for septic + cardiogenic etiology.  Occurred after extraction of infected teeth. However cath suggests acute RCA infarction as probable culprit. Echo with LV EF 20-25% and moderate RVE/severe RV dysfunction; marked change from prior with biventricular failure and peri-apical akinesis of both the LV and RV, relative basal preservation; severe aortic stenosis, moderate-severe mitral regurgitation, dilated IVC. Lactate initially 7.4. Cath (1/20):Severe CAD with acute RCA occlusion and 95% mLAD. Low output on multiple pressors. PAPI 1.3. Deteriorated overnight with progressive cardiogenic +/- septic shock. Currently on vaso 0.02 -> 0.03 units, norepi 26 -> 40 mcg, milrinone 0.375 mcg + midodrine 15 tid - Much worse today despite near maximal pressor support. Now  DNR/DNI.  - Vancomycin/cefepime started empirically. Vanc stopped 1/21. Cx NGTD - See below for discussion of cardiac issues 2. Acute systolic CHF -> cardiogenic shock: Echo this admission with LV EF 20-25% and moderate RVE/severe RV dysfunction; marked change from prior with biventricular failure and peri-apical akinesis of both the LV and RV, relative basal preservation; severe aortic stenosis, moderate-severe mitral regurgitation, dilated IVC. Marked change from 12/22.  Cath 1/20 Severe CAD with acute RCA occlusion and 95% mLAD. Low output on multiple pressors. PAPI 1.3 Currently on vaso 0.02 -> 0.03 units, norepi 26 -> 40 mcg, milrinone 0.375 mcg + midodrine 15 tidCurrently on vaso 0.02 -> 0.03 units, norepi 26 -> 40 mcg, milrinone 0.375 mcg + midodrine 15 tid - Was seen by Palliative Care yesterday. Now DNR/DNI. Refusing further procedures but wants to continue hemodynamic  support until she gets to talk to more family. Condition continues to deteriorate  - She is now actively dying. Waiting for one more family member to arrive this am and then will change to full comfort care.  Dilaudid gtt started by Palliative Care 3. CAD: NSTEMI with HS-TnI  [phone].  Cath 1/20. Severe CAD with acute RCA occlusion and 95% mLAD. No current angina. Refusing further procedures - Continue heparin for now - ASA, statin.  4. Aortic stenosis: Severe low flow/low gradient aortic stenosis.  Has started TAVR workup.  - Not TAVR candidate with high-grade CAD and shock.  5. AKI on CKD stage 3: Baseline creatinine around 1.5. Continues to worsen post cath. Scr now 2.7  - Support cardiac output and follow closely.  6. Mitral regurgitation: Moderate to severe on echo, likely functional.   7. Atrial fibrillation: Chronic.  Rate in 110-120s for now.  - On heparin gtt.  - Continue amiodarone gtt 60 mg/hr for rate control.  8. Legally blind 9. ID: WBCs 22 but afebrile.  Concern for septic shock picture.  ?Odontogenic seeding from dental extractions. ?PNA on CXR. PCT 0.38, not markedly high, but SVR low initially in 900s on pressor. Cultures NGTD.   - vancomycin/cefepime empirically. Vanc stopped 1/1 - Cultures NGTD  10. Acute hypoxemic respiratory failure: She is on HFNC but CVP not markedly elevated CXR with bibasilar airspace disease, ?PNA. Also possible pleural effusions.   - Continue abx.  - 1/20  Chest CT Bilateral LL airspace disease L> R - CCM following.  11. DNR/DNI - see discussions above - transition to comfort care today  CRITICAL CARE Performed by: Glori Bickers  Total critical care time: 45 minutes  Critical care time was exclusive of separately billable procedures and treating other patients.  Critical care was necessary to treat or prevent imminent or life-threatening deterioration.  Critical care was time spent personally by me on the following activities: development of treatment plan with patient and/or surrogate as well as nursing, discussions with consultants, evaluation of patient's response to treatment, examination of patient, obtaining history from patient or surrogate, ordering and performing treatments and interventions, ordering and review of laboratory studies, ordering and review of radiographic studies, pulse oximetry and re-evaluation of patient's condition.  Glori Bickers 03/27/21 8:56 AM

## 2021-03-28 NOTE — Progress Notes (Signed)
ANTICOAGULATION CONSULT NOTE  Pharmacy Consult for IV heparin Indication: chest pain/ACS  Allergies  Allergen Reactions   Plendil [Felodipine]    Aspirin Other (See Comments)    Stomach upset   Barbiturates Other (See Comments)    Poorly tolerated   Eggs Or Egg-Derived Products Other (See Comments)    diarrhea   Epinephrine Other (See Comments)    Fever    Erythromycin Base Nausea And Vomiting    All mycins   Felodipine Er Other (See Comments)    headache   Morphine And Related Other (See Comments)    Rash    Oxycodone Hcl Other (See Comments)    Heart race   Penicillins Other (See Comments)    Fever    Pravachol Other (See Comments)    Muscle ache   Soy Allergy Other (See Comments)    shaking   Sulfa Antibiotics Other (See Comments)    rash   Wheat Other (See Comments)    Stomach upset   Adhesive [Tape] Other (See Comments)    Rash     Patient Measurements: Height: 5\' 3"  (160 cm) Weight: 72.7 kg (160 lb 4.4 oz) IBW/kg (Calculated) : 52.4 Heparin Dosing Weight: 68.5kg  Vital Signs: Temp: 97.5 F (36.4 C) (01/22 0500) BP: 79/44 (01/22 0500) Pulse Rate: 101 (01/22 0500)  Labs: Recent Labs    02/26/2021 0506 03/10/2021 1654 03/17/21 0902 03/17/21 1000 03/17/21 1903 03/23/21 0500  HGB 9.6* 9.5*   9.5* 8.3*  --   --  8.0*  HCT 29.7* 28.0*   28.0* 25.4*  --   --  24.8*  PLT 211  --  199  --   --  186  HEPARINUNFRC 0.24*  --   --  0.20* 0.52 0.72*  CREATININE 1.60*  --  2.00*  --   --  2.71*     Estimated Creatinine Clearance: 14 mL/min (A) (by C-G formula based on SCr of 2.71 mg/dL (H)).   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 PMH significant for atrial flutter on chronic anticoagulation with warfarin. Pharmacy to dose IV heparin.   Heparin level came back slightly supratherapeutic at 0.72, on 1250 units/hr. No s/sx of bleeding or infusion issues. Hgb 8, plt 186.   Goal of Therapy:  Heparin level 0.3-0.7 units/ml Monitor platelets by anticoagulation protocol: Yes   Plan:  -Reduce heparin infusion to 1150 units/hr  -Order 8 hr heparin level -Monitor daily with CBC,  HL  Antonietta Jewel, PharmD, BCCCP Clinical Pharmacist  Phone: [phone] 03-22-2021 7:30 AM  Please check AMION for all [place] phone numbers After 10:00 PM, call [place] [phone]

## 2021-03-28 NOTE — Progress Notes (Addendum)
Palliative Medicine Inpatient Follow Up Note  HPI: 88 y.o. female  with past medical history of severe AS, A fib/Aflutter, HTN, HLD, CKD, hypothyroidism, leiomyosarcoma, and blindness from macular degeneration admitted on 03/25/2021 with NSTEMI and shock. EF 20-25% and severe RV dysfunction, severe aortic stenosis, and moderate-severe mitral regurgitation. Remains on milrinone, levophed, and vasopressin infusions. Patient has told cardiology she is not interested in anymore procedures. PMT consulted to discuss plan moving forward.  Today's Discussion (03-27-2021):  *Please note that this is a verbal dictation therefore any spelling or grammatical errors are due to the "[employer]" system interpretation.  Chart reviewed inclusive of progress notes, laboratory results, and diagnostic images.   I met with Dezaria and her long term partner, Richard at bedside. Richard shares that Evangelia is restless this morning. We reviewed the idea of delirium and how that plays into patients hospitalizations most especially in the ICU setting. Richard shares that normally she is fairly awake during the evening hours and sleeps during the mornings.   I shared with Delfino Lovett my concerns regarding Delainee's present health state. Reviewed that I suspect even with all present measures her time on earth is quite limited.  Wladyslawa was able to open her eyes and engage hall-way through my time meeting with Richard. She shares the idea of struggling with other people (her daughters and niece) seeing her die. She expresses, "no one should have to see another person die." I validated these emotions through therapeutic listening.  I encouraged Leasa to consider comfort focused care as this would alleviate any pain or suffering that she is experiencing presently.  Treyana's daughter's will be arriving this morning for further conversations.   Questions and concerns addressed   Palliative Support Provided.    Objective Assessment: Vital Signs Vitals:   2021-03-27 0700 03/27/21 0800  BP:  (!) 126/94  Pulse:  (!) 123  Resp: (!) 26 (!) 28  Temp: (!) 97 F (36.1 C) (!) 96.8 F (36 C)  SpO2:  93%    Intake/Output Summary (Last 24 hours) at Mar 27, 2021 0301 Last data filed at 03/27/21 0500 Gross per 24 hour  Intake 3039.8 ml  Output --  Net 3039.8 ml   Last Weight  Most recent update: 2021/03/27  5:53 AM    Weight  72.7 kg (160 lb 4.4 oz)            Gen:  Elderly F in mild distress HEENT: Dry mucous membranes CV: Irregular rate and rhythm PULM: 7LPM [place] ABD: soft/nontender  EXT: No edema  Neuro: Lethargic will arouse and answer questions  SUMMARY OF RECOMMENDATIONS   DNAR/DNI  Patient more lethargic this morning, appears more mentally altered per discussion with day RN  Plan to meet with patients daughters for further conversations about transitioning to comfort measures, ideally sooner than later as I believe her body processes have already begun pertaining to death and dying  Ongoing Palliative support ___________________________________________ Addendum:  I met with Enid Derry and her two daughters this late morning. Plan will be for transition to comfort care once Shelina's niece arrives. In the meanwhile plan to start a low dose dilaudid gtt for comprehensive pain relief. Patient and her family is aware that this may alter her and cause somnolence. They accept these risks.  _____________________________________________ Addendum #2:  Received a message that family was ready for comfort care. Measures instituted in epic.   MDM - High  Medical Decision Making: 4 #/Complex Problems: 4  Data Reviewed:    4             Management: 4 (1-Straightforward, 2-Low, 3-Moderate, 4-High) ______________________________________________________________________________________ [facility] Team Team Cell Phone:  [phone] Please utilize secure chat with additional questions, if there is no response within 30 minutes please call the above phone number  Palliative Medicine Team providers are available by phone from 7am to 7pm daily and can be reached through the team cell phone.  Should this patient require assistance outside of these hours, please call the patient's attending physician.

## 2021-03-28 NOTE — Death Summary Note (Signed)
Advanced Heart Failure Death Summary  Death Summary   Patient ID: [name] MRN: [id number], DOB/AGE: May 29, 1931 88 y.o. 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   Initial concern for primarily septic etiology.  Occurred after extraction of infected teeth. ?Odontogenic seeding from dental extractions. ?PNA on CXR. PCT 0.38. Low SVR. Lactate initially 7.4. Placed on broad spectrum abx and pressors. Blood cultures showed no growth.   She ruled in for NSTEMI and cath suggested acute RCA infarction as probable culprit. Echo with LV EF 20-25% and moderate RVE/severe RV dysfunction; marked change from prior with biventricular failure and peri-apical akinesis of both the LV and RV, relative basal preservation; severe low flow/ low gradient aortic stenosis, moderate-severe mitral regurgitation, dilated IVC.  Cath (1/20): Severe CAD with acute RCA occlusion and 95% mLAD. Low output on multiple pressors. PAPI 1.3.  Not TAVR candidate with high-grade CAD and shock.   She developed progressive cardiogenic + septic shock and MSOF, refractory to maxim inotropic and pressor support. Palliative care was consulted to discuss [place]. Pt expressed desire to avoid further procedures. Decision made to transition to full comfort care. She was made DNR/DNI. Pressors discontinued and placed on comfort gtts. Pt expired 1/22.   Significant Diagnostic Studies  2D Echo  03/11/2021 1. Severely reduced LV function, EF 20-25%. WMA concerning for LAD  infarction vs stress induced cardiomyopathy. LVOT VTI 7.0 cm which equates  to CO 1.8 L/min and 1.0 L/min/m2. Left ventricular ejection fraction, by  estimation, is 20 to 25%. The left  ventricle has severely decreased function. The left ventricle demonstrates  regional wall motion abnormalities (see scoring diagram/findings for  description). The left ventricular internal cavity size was mildly  dilated. Left ventricular diastolic  function could not be evaluated.   2. Severe low flow low gradient aortic stenosis is present. AS is likely  critical. Vmax 2.5 m/s, MG 16 mmHG, AVA 0.43 cm2, DI 0.19. SV index  10cc/m2. The aortic valve is calcified. Aortic valve regurgitation is not  visualized. Severe aortic valve  stenosis.   3. Right ventricular systolic function is severely reduced. The right  ventricular size is moderately enlarged. There is moderately elevated  pulmonary artery systolic pressure. The estimated right ventricular  systolic pressure is 40.1 mmHg.   4. Left atrial size was severely dilated.   5. Right atrial size was severely dilated.   6. The mitral valve is degenerative. Moderate to severe mitral valve  regurgitation.   7. Tricuspid valve regurgitation is moderate.   8. The inferior vena cava is dilated in size with <50% respiratory  variability, suggesting right atrial pressure of 15 mmHg.   Comparison(s): Changes from prior study are noted. EF is now reduced with  WMA. Concerns for low output. Critical AS.   [facility] 03/22/2021   Ost LAD lesion is 40% stenosed.   Mid Cx lesion is 30% stenosed.   Mid LAD lesion is 95% stenosed.   2nd Diag lesion is 70% stenosed.  Prox RCA to Dist RCA lesion is 100% stenosed.   1. Elevated R>L heart filling pressures.  2. Mild pulmonary venous hypertension.  3. Low cardiac output, CI around 2.  4. Significant RV dysfunction with PAPI 1.3.  5. Totally  occluded proximal RCA with weak collaterals.  This is likely an acute occlusion and concern for RV infarct based on echo and RV dysfunction by RHC.  6. Bifurcation lesion mid LAD/D2 with 95% mLAD just after D2, 70% ostial D2.  7. Aortic runoff showed about 60% ostial left common iliac artery stenosis.   Consultations  Advanced Heart Failure Pulmonary Critical Care Medicine  Palliative Care Medicine   Duration of Discharge Encounter: Greater than 35 minutes   Signed, Lyda Jester, PA-C 03/21/2021, 4:18 PM

## 2021-03-28 DEATH — deceased

## 2021-12-12 IMAGING — DX DG SHOULDER 2+V*R*
3 series · 3 of 3 positions shown · non-contrast
Comparison: None

CLINICAL DATA: Recent injury, bruising to mid shaft RIGHT humerus,
RIGHT shoulder pain

EXAM:
RIGHT SHOULDER - 2+ VIEW

[dg shoulder right (1 of 3)]
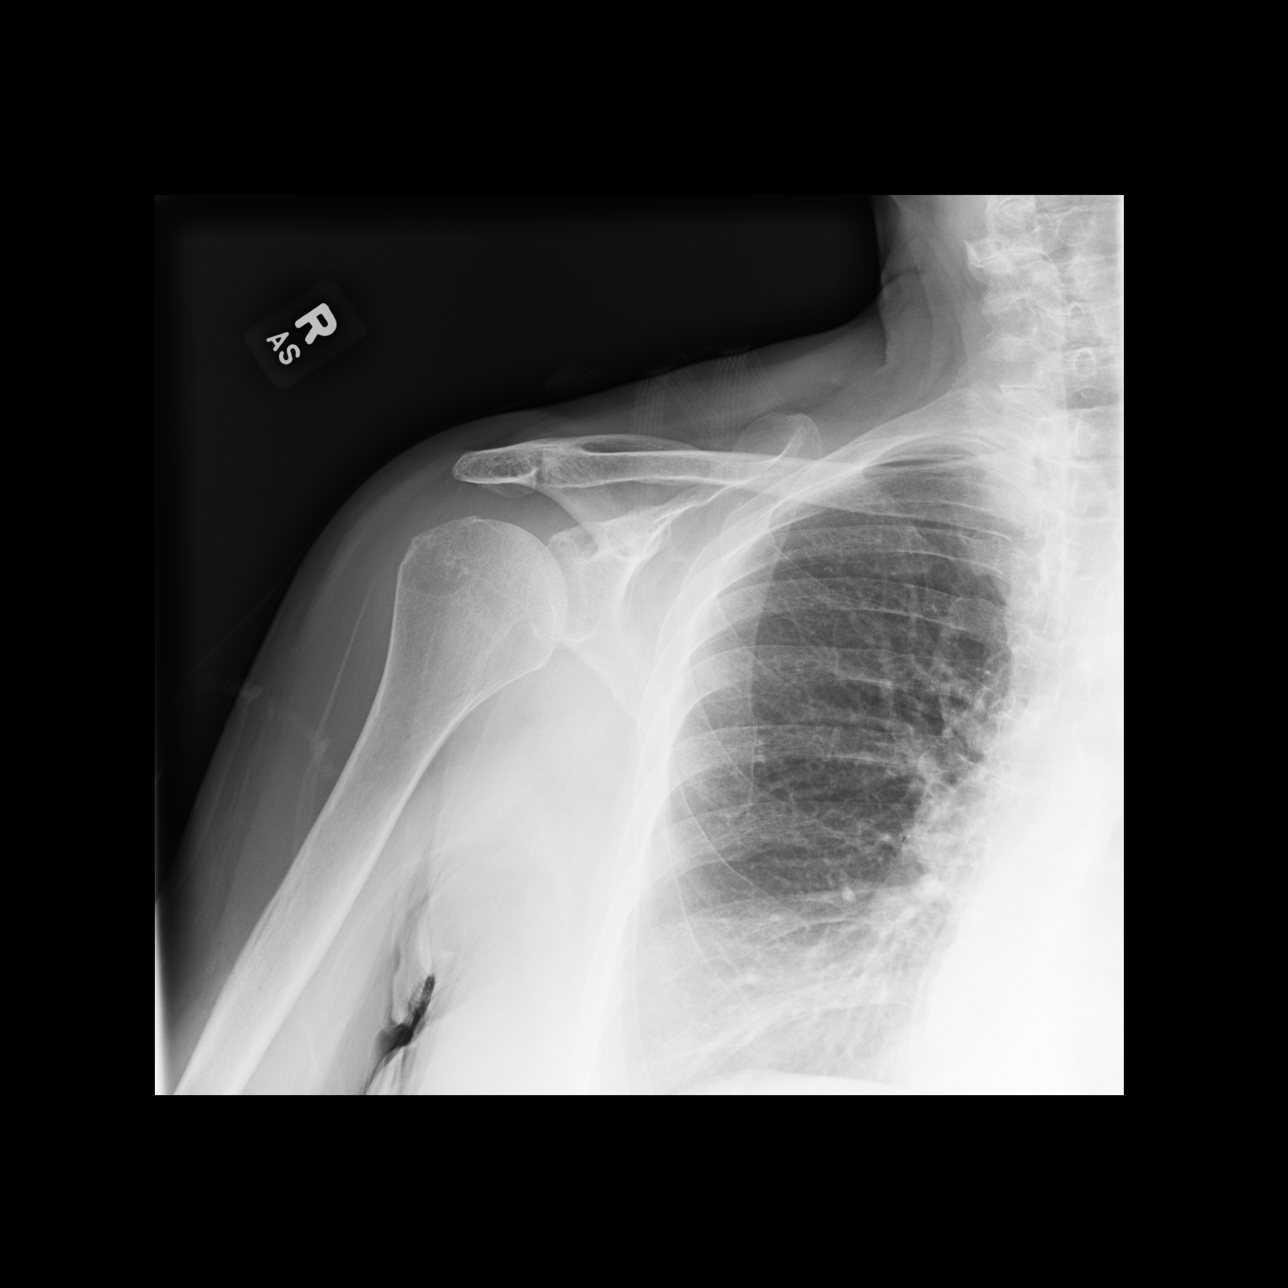

[dg shoulder right (2 of 3)]
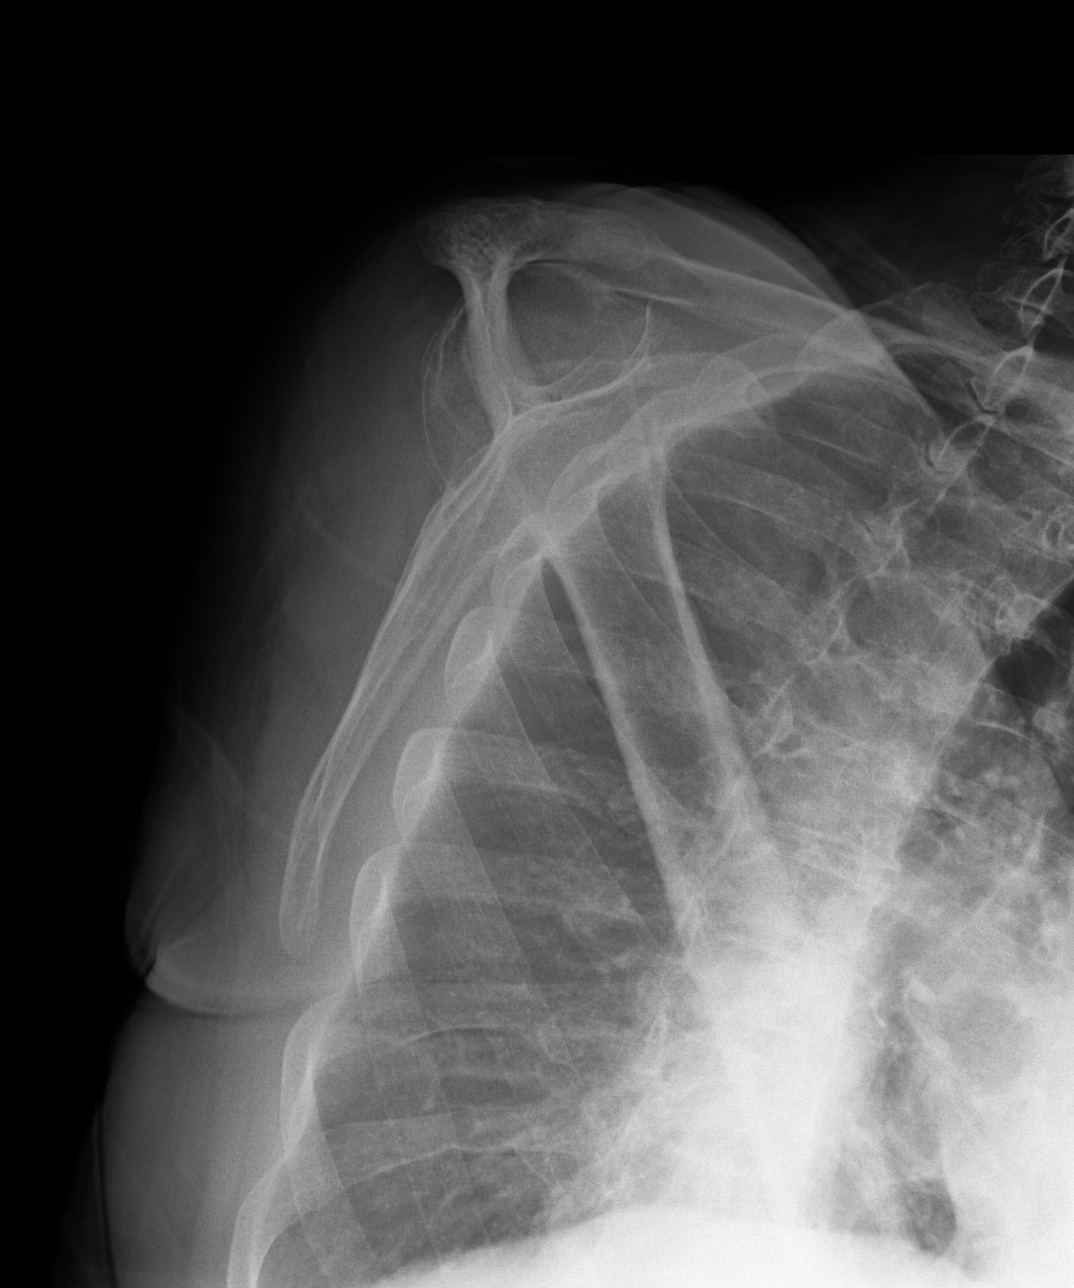

[dg shoulder right (3 of 3)]
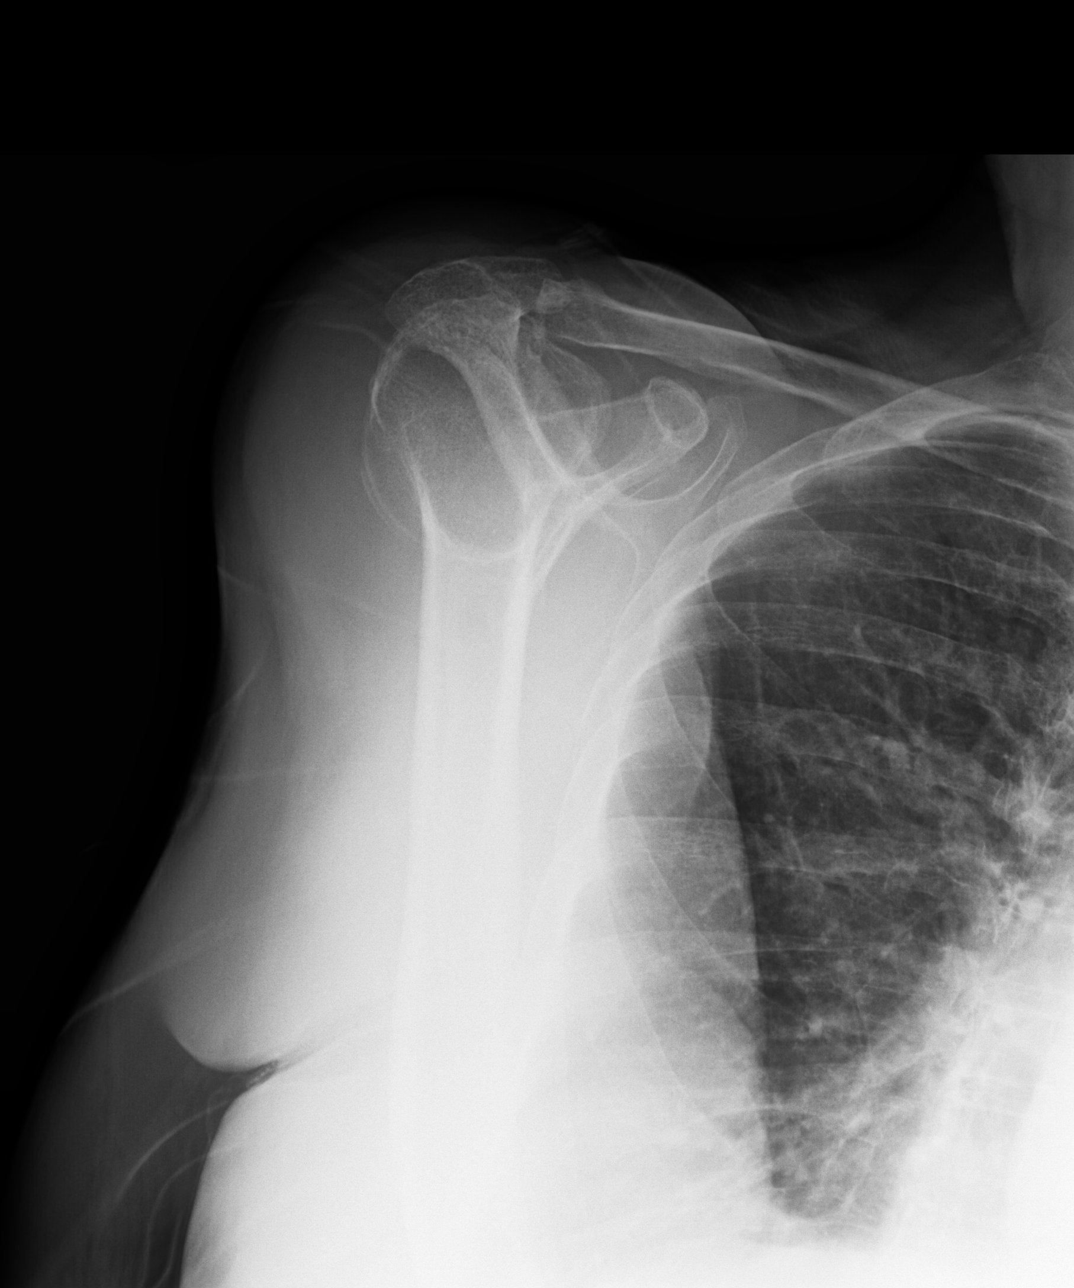

[3 of 3 positions shown; findings below may reference images not displayed]

FINDINGS: Osseous demineralization.

AC joint alignment normal.

Visualized RIGHT ribs intact.

No acute fracture, dislocation, or bone destruction.
IMPRESSION: No acute osseous abnormalities.

## 2021-12-12 IMAGING — DX DG HUMERUS 2V *R*
4 series · 4 of 4 positions shown · non-contrast
Comparison: None

CLINICAL DATA: Recent injury, bruising to mid shaft RIGHT humerus,
RIGHT shoulder pain

EXAM:
RIGHT HUMERUS - 2+ VIEW

[dg humerus right (1 of 4)]
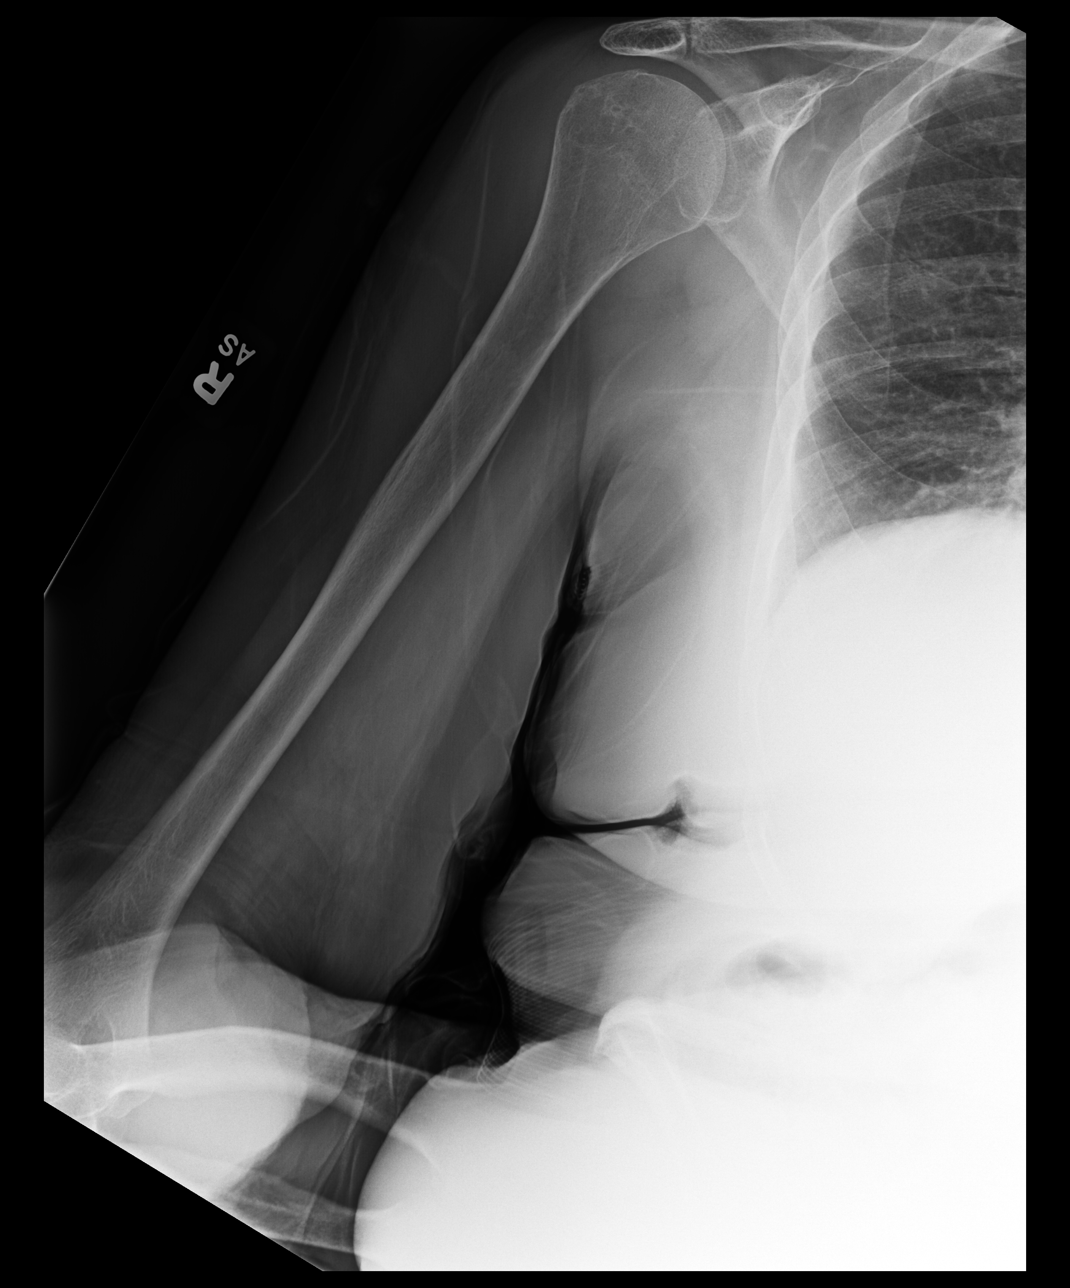

[dg humerus right (2 of 4)]
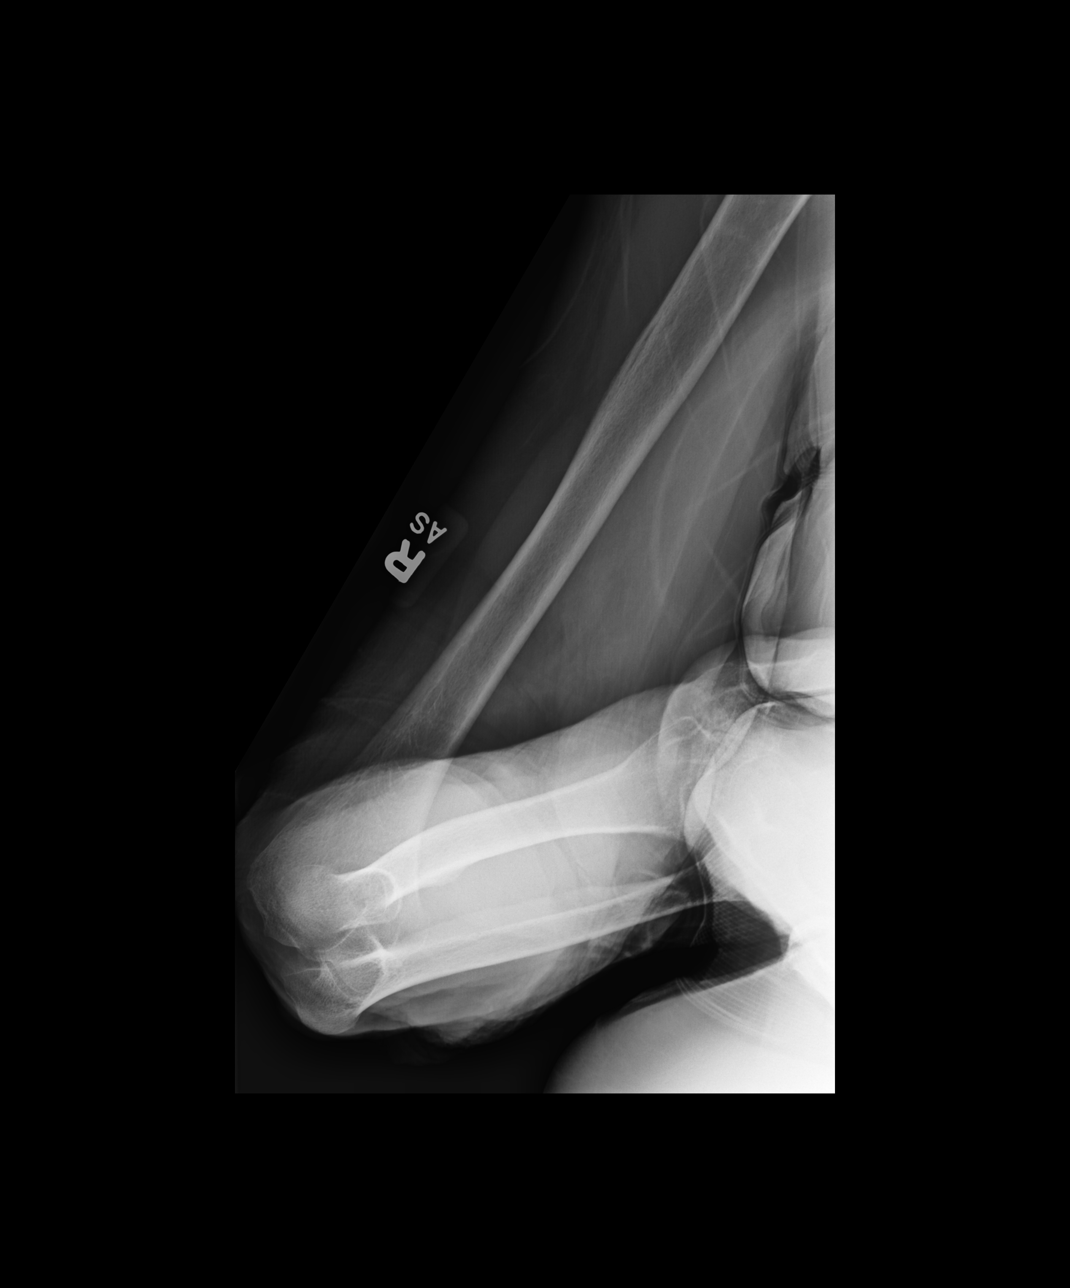

[dg humerus right (3 of 4)]
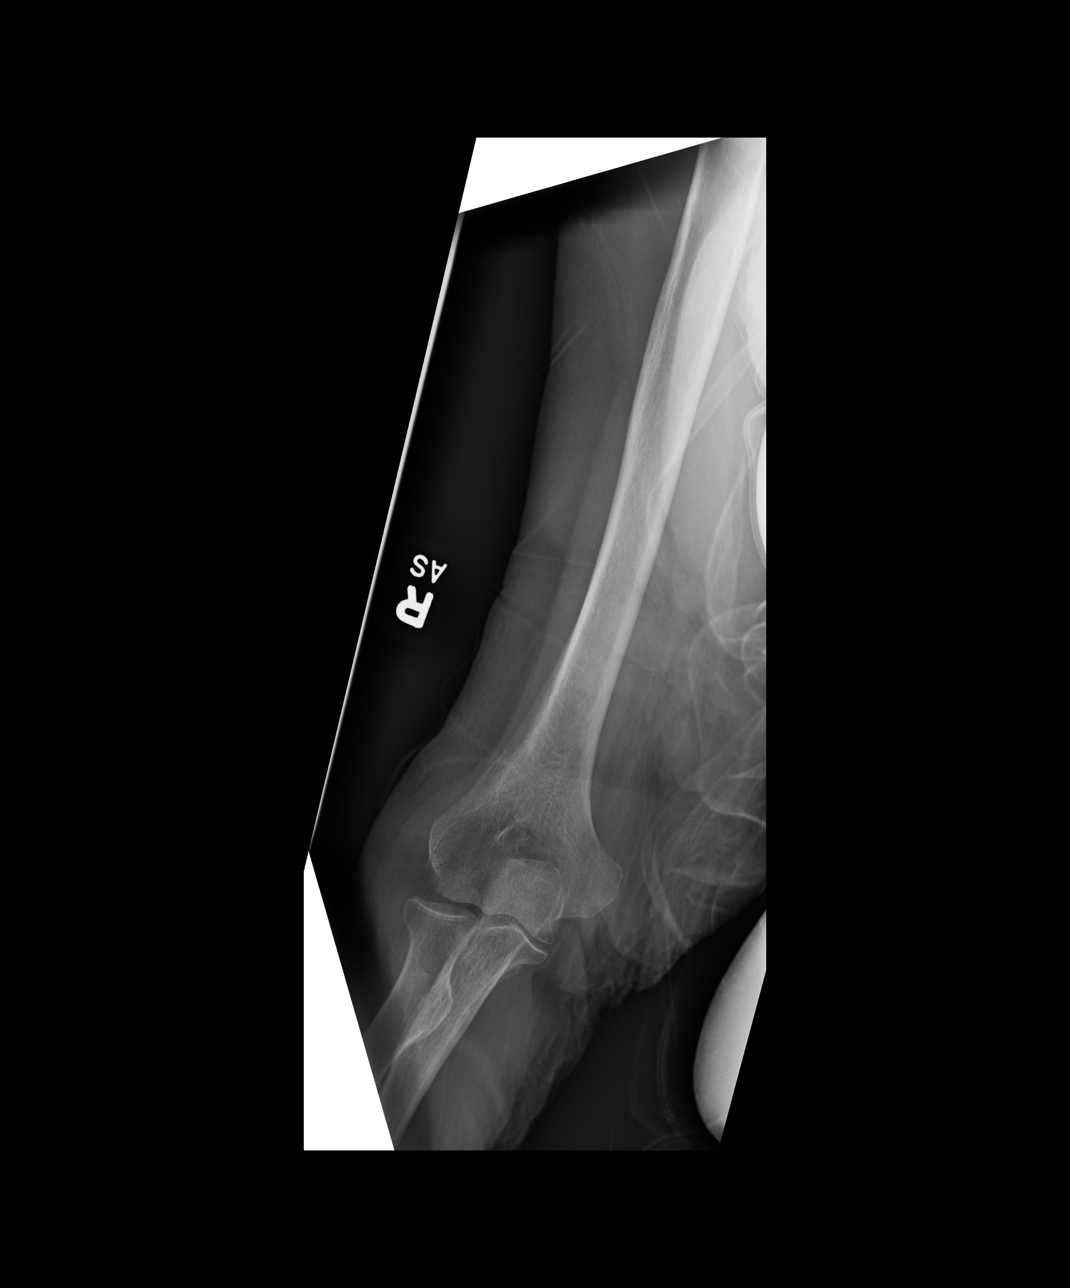

[dg humerus right (4 of 4)]
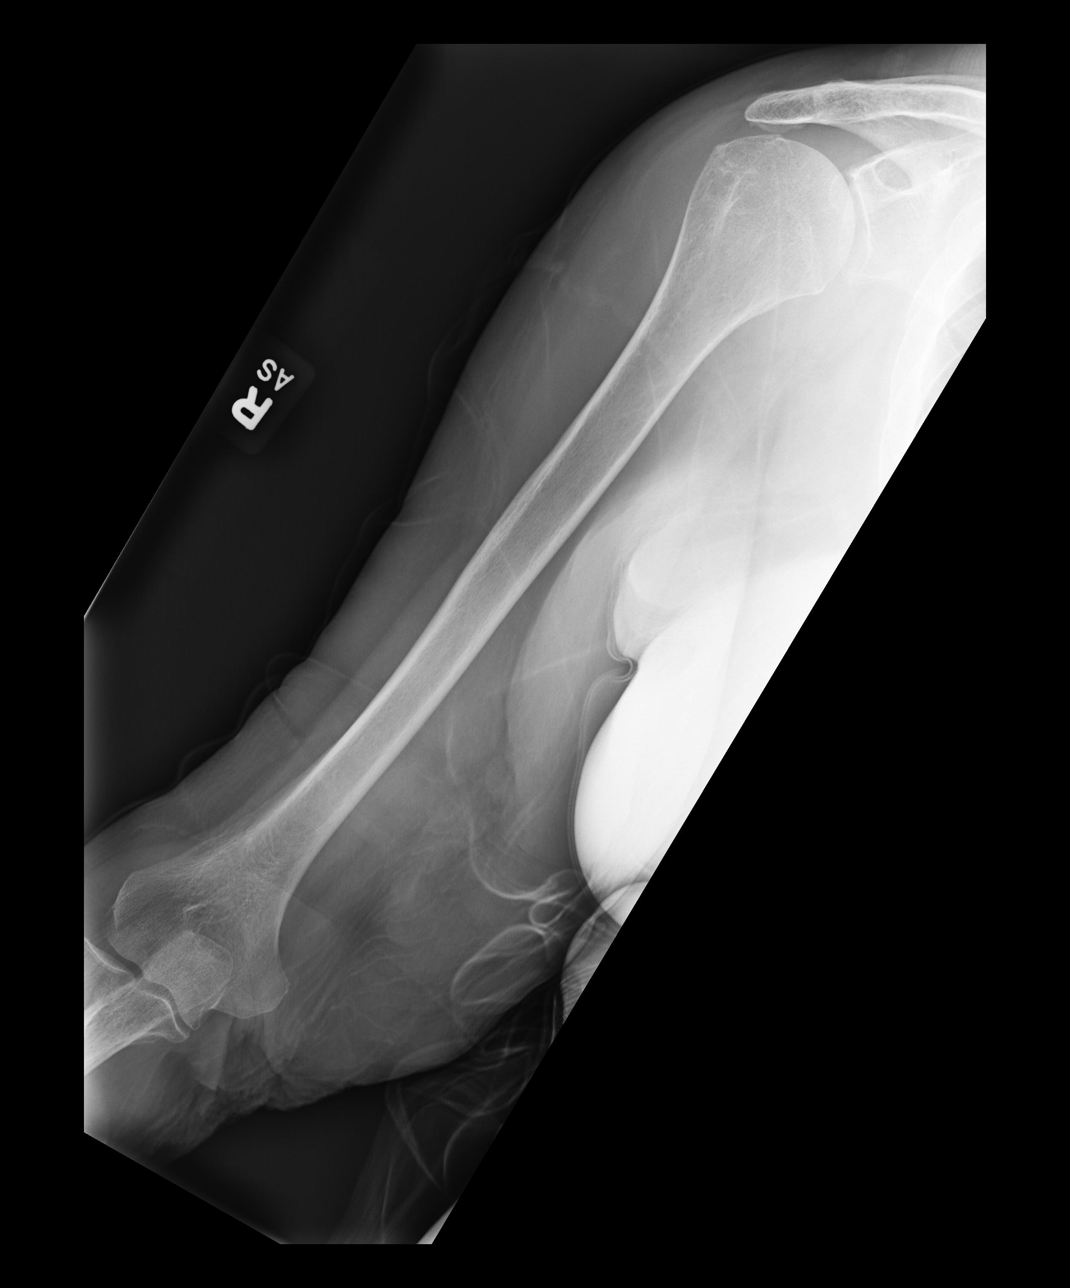

[4 of 4 positions shown; findings below may reference images not displayed]

FINDINGS: Osseous demineralization.

AC joint alignment normal.

Shoulder and elbow joint alignments grossly normal.

No fracture, dislocation, or bone destruction.
IMPRESSION: No acute osseous abnormalities.
